# Patient Record
Sex: Male | Born: 1935 | Race: Black or African American | Hispanic: No | Marital: Married | Smoking: Never smoker
Health system: Southern US, Community
[De-identification: ages and names within clinical notes are randomized; demographics above are authoritative.]

## PROBLEM LIST (undated history)

## (undated) DIAGNOSIS — K219 Gastro-esophageal reflux disease without esophagitis: Secondary | ICD-10-CM

## (undated) DIAGNOSIS — S72002A Fracture of unspecified part of neck of left femur, initial encounter for closed fracture: Secondary | ICD-10-CM

## (undated) DIAGNOSIS — N400 Enlarged prostate without lower urinary tract symptoms: Secondary | ICD-10-CM

## (undated) DIAGNOSIS — I509 Heart failure, unspecified: Secondary | ICD-10-CM

## (undated) DIAGNOSIS — I5189 Other ill-defined heart diseases: Secondary | ICD-10-CM

## (undated) DIAGNOSIS — Z9189 Other specified personal risk factors, not elsewhere classified: Secondary | ICD-10-CM

## (undated) DIAGNOSIS — E785 Hyperlipidemia, unspecified: Secondary | ICD-10-CM

## (undated) HISTORY — DX: Hyperlipidemia, unspecified: E78.5

## (undated) HISTORY — PX: COLON SURGERY: SHX602

## (undated) HISTORY — DX: Gastro-esophageal reflux disease without esophagitis: K21.9

## (undated) HISTORY — DX: Heart failure, unspecified: I50.9

## (undated) HISTORY — DX: Other ill-defined heart diseases: I51.89

## (undated) HISTORY — DX: Benign prostatic hyperplasia without lower urinary tract symptoms: N40.0

## (undated) HISTORY — DX: Other specified personal risk factors, not elsewhere classified: Z91.89

---

## 1999-10-14 ENCOUNTER — Encounter (INDEPENDENT_AMBULATORY_CARE_PROVIDER_SITE_OTHER): Payer: Self-pay | Admitting: Specialist

## 1999-10-14 ENCOUNTER — Encounter: Payer: Self-pay | Admitting: Gastroenterology

## 1999-10-14 ENCOUNTER — Inpatient Hospital Stay (HOSPITAL_COMMUNITY): Admission: EM | Admit: 1999-10-14 | Discharge: 1999-10-24 | Payer: Self-pay | Admitting: Gastroenterology

## 1999-10-23 ENCOUNTER — Encounter: Payer: Self-pay | Admitting: General Surgery

## 2001-01-19 ENCOUNTER — Ambulatory Visit (HOSPITAL_COMMUNITY): Admission: RE | Admit: 2001-01-19 | Discharge: 2001-01-19 | Payer: Self-pay | Admitting: Gastroenterology

## 2001-06-09 ENCOUNTER — Ambulatory Visit (HOSPITAL_COMMUNITY): Admission: RE | Admit: 2001-06-09 | Discharge: 2001-06-09 | Payer: Self-pay | Admitting: Family Medicine

## 2001-07-15 ENCOUNTER — Encounter: Admission: RE | Admit: 2001-07-15 | Discharge: 2001-10-08 | Payer: Self-pay | Admitting: Internal Medicine

## 2001-11-15 ENCOUNTER — Encounter (HOSPITAL_BASED_OUTPATIENT_CLINIC_OR_DEPARTMENT_OTHER): Admission: RE | Admit: 2001-11-15 | Discharge: 2001-11-25 | Payer: Self-pay | Admitting: Internal Medicine

## 2002-03-01 ENCOUNTER — Encounter (HOSPITAL_BASED_OUTPATIENT_CLINIC_OR_DEPARTMENT_OTHER): Admission: RE | Admit: 2002-03-01 | Discharge: 2002-05-30 | Payer: Self-pay | Admitting: Internal Medicine

## 2002-03-16 ENCOUNTER — Encounter: Payer: Self-pay | Admitting: Cardiology

## 2002-03-16 ENCOUNTER — Ambulatory Visit (HOSPITAL_COMMUNITY): Admission: RE | Admit: 2002-03-16 | Discharge: 2002-03-16 | Payer: Self-pay | Admitting: Family Medicine

## 2002-06-06 ENCOUNTER — Encounter (HOSPITAL_BASED_OUTPATIENT_CLINIC_OR_DEPARTMENT_OTHER): Admission: RE | Admit: 2002-06-06 | Discharge: 2002-09-03 | Payer: Self-pay | Admitting: Internal Medicine

## 2002-09-12 ENCOUNTER — Encounter (HOSPITAL_BASED_OUTPATIENT_CLINIC_OR_DEPARTMENT_OTHER): Admission: RE | Admit: 2002-09-12 | Discharge: 2002-12-11 | Payer: Self-pay | Admitting: Internal Medicine

## 2004-05-03 ENCOUNTER — Encounter (INDEPENDENT_AMBULATORY_CARE_PROVIDER_SITE_OTHER): Payer: Self-pay | Admitting: *Deleted

## 2004-05-03 ENCOUNTER — Ambulatory Visit (HOSPITAL_COMMUNITY): Admission: RE | Admit: 2004-05-03 | Discharge: 2004-05-03 | Payer: Self-pay | Admitting: Gastroenterology

## 2005-12-25 ENCOUNTER — Encounter (HOSPITAL_BASED_OUTPATIENT_CLINIC_OR_DEPARTMENT_OTHER): Admission: RE | Admit: 2005-12-25 | Discharge: 2006-03-25 | Payer: Self-pay | Admitting: Internal Medicine

## 2007-01-01 ENCOUNTER — Ambulatory Visit: Payer: Self-pay | Admitting: Vascular Surgery

## 2007-01-01 ENCOUNTER — Ambulatory Visit: Admission: RE | Admit: 2007-01-01 | Discharge: 2007-01-01 | Payer: Self-pay | Admitting: Family Medicine

## 2010-11-22 NOTE — Procedures (Signed)
[facility]  Patient:    [name], [name]                       MRN: [id number] Proc. Date: 01/19/01 Adm. Date:  [id number] Attending:  Louie Bun CC:         Elvina Sidle, M.D.  Anselm Pancoast. Zachery Dakins, M.D.   Procedure Report  PROCEDURE:  Colonoscopy.  INDICATION FOR PROCEDURE:  History of Dukes Stage B-2 colon cancer, status post sigmoid resection one year ago.  DESCRIPTION OF PROCEDURE:  The patient was placed in the left lateral decubitus position and placed on the pulse monitor with continuous low-flow oxygen delivered by nasal cannula.  He was sedated with 40 mg IV Demerol and 5 mg IV Versed.  The Olympus video colonoscope was inserted into the rectum and advanced to the cecum, confirmed by transillumination at McBurneys point and visualization of the ileocecal valve and appendiceal orifice.  The prep was excellent.  The cecum, ascending, transverse, and descending colon all appeared normal with no masses, polyps, diverticula, or other mucosal abnormalities.  There were a few scattered diverticula seen in the descending colon down to the anastomosis which was seen at 30 cm and appeared intact with no visible suspicion of neoplasm.  No biopsies were taken.  The rectum distal to the anastomosis appeared normal with no further diverticula or polyps seen. The scope was then withdrawn, and the patient returned to the recovery room in stable condition.  He tolerated the procedure well, and there were no immediate complications.  IMPRESSION: 1. Intact surgical anastomosis at 30 cm, status post left hemicolectomy. 2. Scattered left-sided diverticula.  PLAN:  Repeat colonoscopy in three years. DD:  01/19/01 TD:  01/20/01 Job: [id number]

## 2010-11-22 NOTE — Assessment & Plan Note (Signed)
[facility]   NAME:  NOVAK, STGERMAINE NO.:  [id number]   MEDICAL RECORD NO.:  [id number]      DATE OF BIRTH:  [birth year]   PHYSICIAN:  Maxwell Caul, M.D. VISIT DATE:  02/06/2006                                     OFFICE VISIT   PURPOSE OF TODAY'S VISIT:  Mr. Erhart returns in followup from his venostasis  ulcer.   WOUND EXAM:  On the left lateral foot, the remaining ulceration is now 100%  epithelialized.  It is less than half a centimeter long.  There is a large  amount of hyperkeratotic skin around this for which I am going to give him  triamcinolone.  However, I do not think there is any wound under here.  Most  of the stasis changes appear to be on the lateral aspect of the foot and  ankle.  There is no edema per se; therefore, I do not think he would benefit  from graded pressure wrapping any further or graded pressure stockings.   DIAGNOSIS:  Venostasis wound on the lateral aspect of the left foot.  This  is now declared 100% resolved.  The skin around this has stasis changes;  however, I do not think he needs anything further in terms of graded  pressure stockings.  He knows to call us as needed.      Maxwell Caul, M.D.  Electronically Signed     MGR/MEDQ  D:  02/06/2006  T:  02/06/2006  Job:  [id number]

## 2010-11-22 NOTE — Consult Note (Signed)
[facility]  Patient:    [name], [name] Visit Number: [id number] MRN: [id number]          Service Type: FTC Location: FOOT Attending Physician:  Sharren Bridge Dictated by:   Jonelle Sports Cheryll Cockayne, M.D. Proc. Date: 07/15/01 Admit Date:  07/15/2001   CC:         Elvina Sidle, M.D.   Consultation Report  HISTORY:  This 77 year old black male is referred at the courtesy of Dr. Elvina Sidle for assistance with the management of an ulcer on the medial aspect of the left heel just distal to the medial malleolus on that side.  The patient has no history of diabetes and no history of active thrombophlebitis ever having been recognized; nonetheless, he has some degree of chronic venous insufficiency in both lower extremities, slightly worse on the left than on the right.  He also has some peripheral arterial disease but has never had ischemic events in his lower extremities.  With that background history, the patient developed, approximately two months ago, a superficial ulcer on the medial aspect of the left heel just distal to the medial malleolus.  He denies any trauma to the area, says it simply appeared spontaneously.  It has progressively enlarged since that time.  There has been minimal drainage and no obvious infection and he has treated this simply with antibiotic cream/one round of oral antibiotics.  This apparently did not result in any reduction in the size of the ulcer and accordingly, he was placed in an Unna wrap from the area of the metatarsal heads just above the ankle; again, no effective healing.  He is accordingly referred for our consultation and assistance.  PAST MEDICAL HISTORY:  Past medical history includes colon cancer in addition to those things previously mentioned.  ALLERGIES:  He has no known medicinal allergies.  MEDICATIONS:  His regular medications include an antihypertensive medication and a pain  pill.  EXAMINATION:  EXTREMITIES:  Examination today is limited to the distal lower extremities. The patients feet are characterized by gryphotic nails and clawing of the lesser toes on both feet.  There is 2+ edema of the left lower extremity from the knee downward and trace to 1+ edema of the right lower extremity. Superficial varicosities are quite apparent on both ankle areas on the dorsum of both feet.  Arterial pulses are palpable and appear adequate.  Skin temperatures are essentially normal.  Monofilament testing shows the preservation of protective sensation throughout both feet.  On the plantar aspects of the feet, there are exophytic calluses on the plantar aspects of the proximal phalangeal areas of both halluces.  In addition, there are significant calluses underlying the fifth metatarsal heads bilaterally.  There is a callus on the plantar aspect of the right heel. There is an exophytic callus at the tip of the right fourth toe.  On the dorsal aspect of the feet, there is a callus overlying the proximal interphalangeal joint of the second toe, which is a clawtoe as well.  On the medial aspect of the left foot in the area just distal to the medial malleolus is a superficial open ulceration measuring 34 x 40 mm with very slight exudate on its otherwise granular surface.  The skin margins around this are slightly elevated and crusty.  There is no evidence of surrounding infection.  There is no evidence of active phlebitis in that extremity.  DISPOSITION: 1. Efforts are made to give the patient and his wife instruction about  foot    care but this is thought to have been of limited avail. 2. The nails are reduced manually by the nurses with satisfactory result. 3. The calluses on the plantar aspects of both halluces and on the plantar    aspects of both fifth metatarsal head areas are sharply pared without    incident. 4. The callus on the plantar aspect of the right heel  is pared without    incident and because there is a slight core, 15% salicylic acid and    collodion are applied following the paring. 5. The exophytic callus on the tip of the left fourth toe is sharply pared    without incident. 6. The callus on the dorsal aspect of the PIP joint of the right second toe is    sharply pared without incident. 7. The shaggy separated skin at the margins of the open ulcer on the instep of    the left foot is sharply pared away without incident. 8. The ulcer is then dressed with Silverlon and the patient is placed in a    Profore wrap running all the way to the knee. 9. The patient will be seen in one and two weeks for change of the dressing    and wrap by the nurses and in three weeks by the physician. Dictated by:   Jonelle Sports Cheryll Cockayne, M.D. Attending Physician:  Sharren Bridge DD:  07/15/01 TD:  07/15/01 Job: [id number]

## 2010-11-22 NOTE — Assessment & Plan Note (Signed)
[facility]   NAME:  [name], [name] NO.:  [id number]   MEDICAL RECORD NO.:  [id number]      DATE OF BIRTH:  02/21/1926   PHYSICIAN:  Maxwell Caul, M.D. VISIT DATE:  01/30/2006                                     OFFICE VISIT   PURPOSE OF TODAY'S VISIT:  Continued followup of a venous stasis wound.   WOUND EXAMINATION:  [name] has had an ulcer on his left lateral malleolus  that has been treated with compression therapy.  He has signs of venous  stasis, but without any odor or significant pain.  His peripheral pulses  seem to be intact.   He has a now small venous stasis ulcer over the left lateral malleolus.  This is in, I think, a fairly good state of healing.  It has probably 50%  epithelialized now.  He has a large amount of hyperkeratotic skin around  this which I think should respond to triamcinolone.  This appears to be  improved.  Incidentally, also reduced a callous over the metatarsal head on  the plantar surface of his foot.   Wound since last visit is improved.  I have put triamcinolone to this area.  We did a partial thickness debridement to take away some of the  hyperkeratotic areas around the wound.  Other than that, Una boot  compression to continue.  He will return in seven to 10 days.  He will  probably need to have graded pressure stockings ultimately, however, I did  not order these today.      Maxwell Caul, M.D.  Electronically Signed     MGR/MEDQ  D:  01/30/2006  T:  01/30/2006  Job:  [id number]

## 2010-11-22 NOTE — Consult Note (Signed)
[name], [name]                ACCOUNT NO.:  [id number]   MEDICAL RECORD NO.:  [id number]          PATIENT TYPE:  REC   LOCATION:  FOOT                         FACILITY:  [facility]   PHYSICIAN:  Jonelle Sports. Sevier, M.D. DATE OF BIRTH:  04-Jun-1926   DATE OF CONSULTATION:  12/25/2005  DATE OF DISCHARGE:                                   CONSULTATION   HISTORY OF PRESENT ILLNESS:  This 77 year old black male is seen at the  courtesy of Dr. Christell Constant and his physician's assistant.  The patient had been a  patient in our foot care center which was the antecedent to the W[facility]  Center in the past.  He had at that time some lateral hindfoot ulcers which  had been thought to be only venous stasis and which have responded  satisfactorily to usual treatment measures to include compressive wraps.   He appears now with a new lesion in that same area which he first noted  approximately 4-6 weeks ago.  He has had some minimal topical attention to  the wound and has had a course of antibiotics which he continues (the name  of which is unknown, but by description it sounds to be cephalexin).  He is  referred now for our further evaluation and advice.   PAST MEDICAL HISTORY:  Notable for hyperlipidemia and history of colon  cancer.  He is not presently on any chemotherapy or other immuno-modifying  agents.   MEDICATIONS:  Regular medications other than Lipitor and his current  antibiotic are unknown.  The patient did not bring his medications with him.   ALLERGIES:  He is said to have no medicinal allergies.   PHYSICAL EXAMINATION:  Examination today is limited to the distal lower  extremities.  Both lower extremities show trace edema, slightly more so on  the left than on the right.  There is a hammer toe deformity on the second  to of the right foot with a heavy callus on the dorsal aspect of the joint  there.  There is also a heavy callus on the lateral aspect of the proximal  phalanx of the  hallux.  That extremity is otherwise unremarkable and will  not be further described.   The left lower extremity as above, has a mild degree of edema.  Pulses are  palpable and bounding.  Monofilament testing shows that he has protective  sensation.  Again on this foot there are calluses underlying the plantar  aspect of the proximal phalanx of the hallux and another on the plantar  aspect of the heel.  On the lateral aspect of the heel in an area of scarred  skin is noted a rather shaggy and crusty active ulceration which measures an  estimated 0.8 x 0.8 x 0.3 cm.  There is no active drainage at the moment, no  odor, and no evidence of deep or surrounding infection.   IMPRESSION:  Venous ulceration lateral left hindfoot, recurrent.   DISPOSITION:  The primary wound on the left foot is sharply debrided full  thickness with removal of a great deal of  slough and crusty tissue and  indeed the resulting wound is much larger at 1.6 x 1.3 x 0.1 cm.  The wound  base at this point is reasonably healthy, but not fully granulated.   There is some fibrinous adherent slough in the wound base despite the  debridement.   The calluses on the dorsal aspect of the right second toe, on the plantar  aspect of both halluces, and on the plantar aspect of the left heel are all  sharply reduced by paring and 15% salicylic acid and collodion is applied to  all of those on the plantar surface of the feet.   He is given a silo pad to protect the dorsal aspect of the joints on his  right second hammertoe.   Following debridement of the wound on the left, it is treated with an  application of Panafil covered by an Allevyn pad and that extremity is then  placed in a Profore wrap from the base of the toes to the knees to combat  the venous hypertension and edema.   Follow-up visit will be here in 10 days on January 05, 2006.           ______________________________  Jonelle Sports. Cheryll Cockayne, M.D.     RES/MEDQ  D:   12/25/2005  T:  12/25/2005  Job:  [id number]   cc:   Ernestina Penna, M.D.  Fax: [phone]

## 2010-11-22 NOTE — Assessment & Plan Note (Signed)
[facility]   NAME:  [name], [name] NO.:  [id number]   MEDICAL RECORD NO.:  [id number]           DATE OF BIRTH:   PHYSICIAN:  Maxwell Caul, M.D.      VISIT DATE:                                     OFFICE VISIT   VITAL SIGNS:  The patient's vitals were stable.  He is afebrile.   PURPOSE OF TODAY'S VISIT:  This is a followup visit.  Mr. [name] has  been followed in the [facility] Care Center for a left lower extremity venous  stasis ulcer.  He was last seen by the physicians on 06/20 but since then he  has been having [employer] wrap placed weekly by the nurses.   WOUND EXAM:  On the left lateral ankle just near the lateral malleolus he  has a clean now epithelializing wound.  The wound dimensions are 0.7 x 1 cm.  The depth is minimal.  The wound appears to be epithelializing.  He still  has 1+ edema.  He is noted to have a 2-3+ bounding dorsalis pedis pulse.   IMPRESSION:  Improving stasis ulcer now with epithelialization.  No evidence  of infection.  No evidence of arterial insufficiency.   The patient is to resume compression with an [employer].  It is anticipated  this will heal.      Maxwell Caul, M.D.  Electronically Signed     MGR/MEDQ  D:  01/23/2006  T:  01/23/2006  Job:  [id number]

## 2010-11-22 NOTE — Assessment & Plan Note (Signed)
[facility]   NAME:  CHRISTPHOR, GROFT                ACCOUNT NO.:  [id number]   MEDICAL RECORD NO.:  [id number]      DATE OF BIRTH:  [birth year]   PHYSICIAN:  Jake Shark A. Tanda Rockers, M.D. VISIT DATE:  01/16/2006                                     OFFICE VISIT   SUBJECTIVE:  Mr. Mackowski is a 77 year old male who has a right lateral  malleolus ulcer that has been treated with compression therapy.  During the  interim, he has worn the multilevel wrap without difficulty.  He reports  moderate drainage, no malodor and no pain.   OBJECTIVE:  VITAL SIGNS:  Blood pressure 104/60, pulse not indicated,  afebrile, respirations 16.  EXTREMITIES:  Inspection of the left lower extremity shows that the ulcer  has a necrotic periphery and base.  These areas were full thickness debrided  with hemorrhage control with cautery.  The wound was photographed, measured  and entered into the wound expert program.   ASSESSMENT:  Improved plan.  We will use triamcinolone ointment and a Dome  paste [employer]  We will reevaluate the patient in one week.           ______________________________  Theresia Majors. Tanda Rockers, M.D.     Cephus Slater  D:  01/16/2006  T:  01/16/2006  Job:  [id number]

## 2010-11-22 NOTE — Op Note (Signed)
NAMEGRADIE, [name]                ACCOUNT NO.:  [id number]   MEDICAL RECORD NO.:  [id number]          PATIENT TYPE:  AMB   LOCATION:  ENDO                         [facility]ACILITY:  [facility]   PHYSICIAN:  John C. Madilyn Fireman, M.D.    DATE OF BIRTH:  28-Mar-1926   DATE OF PROCEDURE:  05/03/2004  DATE OF DISCHARGE:                                 OPERATIVE REPORT   PROCEDURE:  Colonoscopy.   INDICATIONS FOR PROCEDURE:  History of colon cancer, status post resection  in 1992.   PROCEDURE:  The patient was placed in the left lateral decubitus position  and placed on the pulse monitor with continuous low flow oxygen delivered by  nasal cannula.  He was sedated with 25 mcg IV fentanyl and 3 mg IV Versed.  The Olympus video colonoscope was inserted into the rectum and advanced to  the cecum, confirmed by transillumination at McBurney's point and  visualization of the ileocecal valve and appendiceal orifice.  The prep was  excellent.  The cecum and ascending colon appeared normal, with no masses,  polyps, diverticula, or other mucosal abnormalities.  Within the transverse  colon, there was an 8 mm sessile polyp that was removed by snare.  The  remainder of the transverse colon appeared normal.  In the descending and  sigmoid colon, there were several scattered diverticula.  The previous  surgical anastomosis was barely visible at 30 cm, with no visible suspicion  of neoplasm.  The rectum appeared normal on retroflexed view.  The anus  revealed no obvious internal hemorrhoids.  The scope was then withdrawn and  the patient returned to the recovery room in stable condition.  He tolerated  the procedure well, and there were no immediate complications.   IMPRESSION:  1.  Transverse colon polyp.  2.  Sigmoid diverticulosis.   PLAN:  Await histology, and will repeat colonoscopy in three to five years.      JCH/MEDQ  D:  05/03/2004  T:  05/03/2004  Job:  [id number]   cc:   _________   Anselm Pancoast. Zachery Dakins,  M.D.  [address], [address]  [place]  [place] [address]  Fax: [phone]

## 2010-11-22 NOTE — Assessment & Plan Note (Signed)
[facility]   NAME:  [name], [name]                ACCOUNT NO.:  [id number]   MEDICAL RECORD NO.:  [id number]      DATE OF BIRTH:  Dec 04, 1925   PHYSICIAN:  Jake Shark A. Tanda Rockers, M.D. VISIT DATE:  12/06/2005                                     OFFICE VISIT   VITAL SIGNS:  His blood pressure is 110/60, respirations are 16, pulse 68,  and he is afebrile.   SUBJECTIVE:  [name] is a 77 year old man who returns for a followup of a  stasis ulcer in the left lateral malleolus.  During the interim he denies  fever.  He has had no pain.   OBJECTIVE:  Inspection of the lower extremity shows that there is a  persistence of 3+ edema.  There is 3+ bounding dorsalis pedis pulse.  The  ulcer itself has a clean, granulating base with minimum drainage.  The  measurements have been entered into the wound expert per the wound care  nurse.   ASSESSMENT:  Improving stasis ulcer.   MANAGEMENT PLAN & GOAL:  We will resume the compression therapy with a  dome's pace Unna boot.  We will reevaluate the patient in 10 days.           ______________________________  Theresia Majors Tanda Rockers, M.D.     Cephus Slater  D:  01/05/2006  T:  01/05/2006  Job:  [id number]

## 2011-04-07 ENCOUNTER — Other Ambulatory Visit: Payer: Self-pay | Admitting: Family Medicine

## 2011-04-07 DIAGNOSIS — J3489 Other specified disorders of nose and nasal sinuses: Secondary | ICD-10-CM

## 2011-04-08 ENCOUNTER — Ambulatory Visit
Admission: RE | Admit: 2011-04-08 | Discharge: 2011-04-08 | Disposition: A | Discharge status: Home / Self Care | Payer: Medicare Other | Source: Ambulatory Visit | Attending: Family Medicine | Admitting: Family Medicine

## 2011-04-08 DIAGNOSIS — J3489 Other specified disorders of nose and nasal sinuses: Secondary | ICD-10-CM

## 2012-10-27 ENCOUNTER — Encounter: Payer: Self-pay | Admitting: Family Medicine

## 2012-10-27 ENCOUNTER — Ambulatory Visit (INDEPENDENT_AMBULATORY_CARE_PROVIDER_SITE_OTHER): Payer: Medicare Other | Admitting: Family Medicine

## 2012-10-27 VITALS — BP 90/64 | HR 60 | Temp 97.5°F | Resp 14 | Wt 147.0 lb

## 2012-10-27 DIAGNOSIS — E785 Hyperlipidemia, unspecified: Secondary | ICD-10-CM | POA: Insufficient documentation

## 2012-10-27 DIAGNOSIS — K5900 Constipation, unspecified: Secondary | ICD-10-CM

## 2012-10-27 DIAGNOSIS — N400 Enlarged prostate without lower urinary tract symptoms: Secondary | ICD-10-CM

## 2012-10-27 DIAGNOSIS — I951 Orthostatic hypotension: Secondary | ICD-10-CM

## 2012-10-27 DIAGNOSIS — K219 Gastro-esophageal reflux disease without esophagitis: Secondary | ICD-10-CM | POA: Insufficient documentation

## 2012-10-27 NOTE — Progress Notes (Signed)
  Subjective:    Patient ID: [name], male    DOB: [birth year], 79 y.o.   MRN: [id number]  HPI  Pleasant 79 year old gentleman who rarely comes to the doctor.  He presents today with several concerns. First he feels very dizzy especially upon standing. Of note he is taking Flomax 0.4 mg by mouth daily for BPH. He also is on Lasix 40 mg by mouth when necessary leg swelling. He denies ear pain, antalgic, or tinnitus.  His blood pressure today in the office is very low at 90/64.  He has a reports episodic constipation. He states he normally goes once a day. However now is going 3 days without having a bowel movement. He denies any abdominal pain. He denies any melena or hematochezia.  He's also having occasional urinary incontinence. Of note he is taking Lasix. Past Medical History  Diagnosis Date  . BPH (benign prostatic hyperplasia)   . GERD (gastroesophageal reflux disease)   . Hyperlipidemia    Current outpatient prescriptions:cetirizine (ZYRTEC) 10 MG tablet, Take 10 mg by mouth daily., Disp: , Rfl: ;  furosemide (LASIX) 40 MG tablet, Take 40 mg by mouth daily., Disp: , Rfl: ;  omeprazole (PRILOSEC) 20 MG capsule, Take 20 mg by mouth daily., Disp: , Rfl: ;  simvastatin (ZOCOR) 40 MG tablet, Take 40 mg by mouth every evening., Disp: , Rfl: ;  tamsulosin (FLOMAX) 0.4 MG CAPS, Take 0.4 mg by mouth daily., Disp: , Rfl:  History   Social History  . Marital Status: Married    Spouse Name: N/A    Number of Children: N/A  . Years of Education: N/A   Occupational History  . Not on file.   Social History Main Topics  . Smoking status: Never Smoker   . Smokeless tobacco: Not on file  . Alcohol Use: Not on file  . Drug Use: Not on file  . Sexually Active: Not on file   Other Topics Concern  . Not on file   Social History Narrative  . No narrative on file      Review of Systems  All other systems reviewed and are negative.       Objective:   Physical Exam   Constitutional: He appears well-developed and well-nourished.  HENT:  Right Ear: Tympanic membrane and ear canal normal.  Left Ear: Tympanic membrane and ear canal normal.  Neck: Carotid bruit is not present. No mass and no thyromegaly present.  Cardiovascular: Normal rate, regular rhythm and normal heart sounds.  Exam reveals no gallop and no friction rub.   No murmur heard. Pulmonary/Chest: Effort normal and breath sounds normal. No respiratory distress. He has no wheezes. He has no rales.  Abdominal: Soft. Bowel sounds are normal.          Assessment & Plan:  Orthostatic hypotension  Unspecified constipation  BPH (benign prostatic hyperplasia)  I asked the patient to hold his Flomax as well as his Lasix. I am hoping this will help raise his blood pressure and ease some of the orthostatic hypotension which I feel  is causing his dizziness. We'll need to monitor for urinary retention off the Flomax. If those symptoms develop we could consider starting finasteride. For his incontinence, I'm going to try to hold Lasix and see if this will improve. For the constipation, asked the patient to begin once daily MiraLax. He is to follow up in one week if no better.

## 2012-10-27 NOTE — Patient Instructions (Addendum)
Stop flomax and lasix (furosemide) due to dizziness.  Flomax can cause dizziness.    Lasix makes you pee and it drops blood presssure.   Start miralax everyday until your bowel movements are regular.

## 2012-11-18 ENCOUNTER — Encounter: Payer: Self-pay | Admitting: Family Medicine

## 2012-11-18 ENCOUNTER — Ambulatory Visit (INDEPENDENT_AMBULATORY_CARE_PROVIDER_SITE_OTHER): Payer: Medicare Other | Admitting: Family Medicine

## 2012-11-18 VITALS — BP 120/60 | HR 60 | Temp 97.7°F | Resp 16 | Wt 147.0 lb

## 2012-11-18 DIAGNOSIS — R3129 Other microscopic hematuria: Secondary | ICD-10-CM

## 2012-11-18 DIAGNOSIS — R319 Hematuria, unspecified: Secondary | ICD-10-CM

## 2012-11-18 DIAGNOSIS — N400 Enlarged prostate without lower urinary tract symptoms: Secondary | ICD-10-CM

## 2012-11-18 LAB — URINALYSIS, ROUTINE W REFLEX MICROSCOPIC
Bilirubin Urine: NEGATIVE
Glucose, UA: NEGATIVE mg/dL
Ketones, ur: NEGATIVE mg/dL
Protein, ur: NEGATIVE mg/dL

## 2012-11-18 LAB — URINALYSIS, MICROSCOPIC ONLY: Casts: NONE SEEN

## 2012-11-18 NOTE — Progress Notes (Signed)
  Subjective:    Patient ID: [name], male    DOB: 1925-07-12, 79 y.o.   MRN: [id number]  HPI Patient reports gross hematuria x1 week. It began last week with vague pain in his lower back, and some mild dysuria. Since that time the dysuria and back pain have totally resolved. The hematuria is lessening. He denies any fevers chills nausea or vomiting. He denies any hesitancy, frequency, or dysuria at the present time. He denies any back or pelvic pain. His urinalysis is significant today for hematuria. Past Medical History  Diagnosis Date  . BPH (benign prostatic hyperplasia)   . GERD (gastroesophageal reflux disease)   . Hyperlipidemia    Current Outpatient Prescriptions on File Prior to Visit  Medication Sig Dispense Refill  . tamsulosin (FLOMAX) 0.4 MG CAPS Take 0.4 mg by mouth daily.       No current facility-administered medications on file prior to visit.   No Known Allergies .   Review of Systems  All other systems reviewed and are negative.       Objective:   Physical Exam  Cardiovascular: Normal rate, regular rhythm and normal heart sounds.   Pulmonary/Chest: Effort normal and breath sounds normal. No respiratory distress. He has no wheezes. He has no rales. He exhibits no tenderness.  Abdominal: Soft. Bowel sounds are normal. He exhibits no distension. There is no tenderness. There is no rebound.  Genitourinary: Rectum normal and penis normal. Prostate is enlarged. Prostate is not tender. Right testis shows no mass and no tenderness. Left testis shows no mass and no tenderness. Uncircumcised. No phimosis, paraphimosis, hypospadias or penile erythema. No discharge found.  Lymphadenopathy:       Right: No inguinal adenopathy present.       Left: No inguinal adenopathy present.          Assessment & Plan:  1. Hematuria CT scan to evaluate for nephrolithiasis or renal cell carcinoma. Urology consult for cystoscopy. I will send a urine culture to rule out  subclinical infection although the patient is asymptomatic today. - Urinalysis, Routine w reflex microscopic - CT Abdomen Pelvis W Contrast; Future - Ambulatory referral to Urology  2. Microscopic hematuria - Urine culture  3. BPH (benign prostatic hyperplasia) - CT Abdomen Pelvis W Contrast; Future - Ambulatory referral to Urology

## 2012-11-19 LAB — URINE CULTURE: Colony Count: NO GROWTH

## 2012-11-24 ENCOUNTER — Other Ambulatory Visit: Payer: Medicare Other

## 2012-11-25 ENCOUNTER — Telehealth: Payer: Self-pay | Admitting: Family Medicine

## 2012-11-25 ENCOUNTER — Other Ambulatory Visit: Payer: Self-pay | Admitting: Family Medicine

## 2012-11-25 DIAGNOSIS — R319 Hematuria, unspecified: Secondary | ICD-10-CM

## 2012-11-26 ENCOUNTER — Ambulatory Visit: Payer: Medicare Other

## 2012-11-26 ENCOUNTER — Other Ambulatory Visit: Payer: Self-pay | Admitting: Family Medicine

## 2012-11-26 ENCOUNTER — Ambulatory Visit
Admission: RE | Admit: 2012-11-26 | Discharge: 2012-11-26 | Disposition: A | Discharge status: Home / Self Care | Payer: Medicare Other | Source: Ambulatory Visit | Attending: Family Medicine | Admitting: Family Medicine

## 2012-11-26 ENCOUNTER — Inpatient Hospital Stay
Admission: RE | Admit: 2012-11-26 | Discharge: 2012-11-26 | Disposition: A | Discharge status: Home / Self Care | Payer: Medicare Other | Source: Ambulatory Visit | Attending: Family Medicine | Admitting: Family Medicine

## 2012-11-26 ENCOUNTER — Other Ambulatory Visit: Payer: Medicare Other

## 2012-11-26 DIAGNOSIS — R319 Hematuria, unspecified: Secondary | ICD-10-CM

## 2012-11-26 MED ORDER — IOHEXOL 300 MG/ML  SOLN: 100.0000 mL | Freq: Once | INTRAMUSCULAR | Status: AC | PRN

## 2012-11-26 NOTE — Progress Notes (Signed)
..  Patient's dtr aware  

## 2012-11-27 NOTE — Telephone Encounter (Signed)
Taken care of already

## 2013-02-17 ENCOUNTER — Emergency Department (HOSPITAL_COMMUNITY): Payer: Medicare Other

## 2013-02-17 ENCOUNTER — Encounter (HOSPITAL_COMMUNITY)
Admission: EM | Disposition: A | Discharge status: Skilled Nursing Facility | Payer: Self-pay | Source: Home / Self Care | Attending: Internal Medicine

## 2013-02-17 ENCOUNTER — Inpatient Hospital Stay (HOSPITAL_COMMUNITY)
Admission: EM | Admit: 2013-02-17 | Discharge: 2013-02-21 | DRG: 482 | Disposition: A | Discharge status: Skilled Nursing Facility | Payer: Medicare Other | Attending: Internal Medicine | Admitting: Internal Medicine

## 2013-02-17 ENCOUNTER — Other Ambulatory Visit: Payer: Self-pay

## 2013-02-17 ENCOUNTER — Inpatient Hospital Stay (HOSPITAL_COMMUNITY): Payer: Medicare Other | Admitting: Certified Registered Nurse Anesthetist

## 2013-02-17 ENCOUNTER — Inpatient Hospital Stay (HOSPITAL_COMMUNITY): Payer: Medicare Other

## 2013-02-17 ENCOUNTER — Encounter (HOSPITAL_COMMUNITY): Payer: Self-pay | Admitting: Emergency Medicine

## 2013-02-17 ENCOUNTER — Encounter (HOSPITAL_COMMUNITY): Payer: Self-pay | Admitting: Certified Registered Nurse Anesthetist

## 2013-02-17 DIAGNOSIS — N400 Enlarged prostate without lower urinary tract symptoms: Secondary | ICD-10-CM | POA: Diagnosis present

## 2013-02-17 DIAGNOSIS — S72033A Displaced midcervical fracture of unspecified femur, initial encounter for closed fracture: Principal | ICD-10-CM | POA: Diagnosis present

## 2013-02-17 DIAGNOSIS — W06XXXA Fall from bed, initial encounter: Secondary | ICD-10-CM | POA: Diagnosis present

## 2013-02-17 DIAGNOSIS — S72002S Fracture of unspecified part of neck of left femur, sequela: Secondary | ICD-10-CM

## 2013-02-17 DIAGNOSIS — S72002A Fracture of unspecified part of neck of left femur, initial encounter for closed fracture: Secondary | ICD-10-CM

## 2013-02-17 DIAGNOSIS — E785 Hyperlipidemia, unspecified: Secondary | ICD-10-CM | POA: Diagnosis present

## 2013-02-17 DIAGNOSIS — K219 Gastro-esophageal reflux disease without esophagitis: Secondary | ICD-10-CM | POA: Diagnosis present

## 2013-02-17 DIAGNOSIS — S72009A Fracture of unspecified part of neck of unspecified femur, initial encounter for closed fracture: Secondary | ICD-10-CM

## 2013-02-17 DIAGNOSIS — D696 Thrombocytopenia, unspecified: Secondary | ICD-10-CM

## 2013-02-17 DIAGNOSIS — Z23 Encounter for immunization: Secondary | ICD-10-CM

## 2013-02-17 DIAGNOSIS — M810 Age-related osteoporosis without current pathological fracture: Secondary | ICD-10-CM | POA: Diagnosis present

## 2013-02-17 DIAGNOSIS — Z79899 Other long term (current) drug therapy: Secondary | ICD-10-CM

## 2013-02-17 DIAGNOSIS — Y92009 Unspecified place in unspecified non-institutional (private) residence as the place of occurrence of the external cause: Secondary | ICD-10-CM

## 2013-02-17 DIAGNOSIS — Z7901 Long term (current) use of anticoagulants: Secondary | ICD-10-CM

## 2013-02-17 HISTORY — DX: Fracture of unspecified part of neck of left femur, initial encounter for closed fracture: S72.002A

## 2013-02-17 HISTORY — PX: PERCUTANEOUS PINNING: SHX2209

## 2013-02-17 LAB — BASIC METABOLIC PANEL
CO2: 26 mEq/L (ref 19–32)
Chloride: 102 mEq/L (ref 96–112)
Potassium: 3.7 mEq/L (ref 3.5–5.1)
Sodium: 135 mEq/L (ref 135–145)

## 2013-02-17 LAB — CBC
HCT: 37.3 % — ABNORMAL LOW (ref 39.0–52.0)
MCV: 93.7 fL (ref 78.0–100.0)
Platelets: 105 10*3/uL — ABNORMAL LOW (ref 150–400)
RBC: 3.98 MIL/uL — ABNORMAL LOW (ref 4.22–5.81)
WBC: 8.3 10*3/uL (ref 4.0–10.5)

## 2013-02-17 LAB — CREATININE, SERUM: GFR calc Af Amer: 90 mL/min — ABNORMAL LOW (ref >90)

## 2013-02-17 LAB — CBC WITH DIFFERENTIAL/PLATELET
Eosinophils Relative: 0 % (ref 0–5)
Lymphocytes Relative: 1 % — ABNORMAL LOW (ref 12–46)
Lymphs Abs: 0.1 10*3/uL — ABNORMAL LOW (ref 0.7–4.0)
MCV: 94.5 fL (ref 78.0–100.0)
Neutro Abs: 9.6 10*3/uL — ABNORMAL HIGH (ref 1.7–7.7)
Neutrophils Relative %: 96 % — ABNORMAL HIGH (ref 43–77)
Platelets: 113 10*3/uL — ABNORMAL LOW (ref 150–400)
RBC: 4.2 MIL/uL — ABNORMAL LOW (ref 4.22–5.81)
WBC: 10 10*3/uL (ref 4.0–10.5)

## 2013-02-17 LAB — TROPONIN I: Troponin I: <0.3 ng/mL (ref <0.30)

## 2013-02-17 SURGERY — PINNING, EXTREMITY, PERCUTANEOUS
Anesthesia: General | Site: Hip | Laterality: Left | Wound class: Clean

## 2013-02-17 MED ORDER — MENTHOL 3 MG MT LOZG: 1.0000 | LOZENGE | OROMUCOSAL | Status: DC | PRN

## 2013-02-17 MED ORDER — ACETAMINOPHEN 325 MG PO TABS: 650.0000 mg | ORAL_TABLET | Freq: Four times a day (QID) | ORAL | Status: DC | PRN

## 2013-02-17 MED ORDER — HYDROCODONE-ACETAMINOPHEN 5-325 MG PO TABS: 1.0000 | ORAL_TABLET | Freq: Four times a day (QID) | ORAL | Status: DC | PRN

## 2013-02-17 MED ORDER — MORPHINE SULFATE 2 MG/ML IJ SOLN: 0.5000 mg | INTRAMUSCULAR | Status: DC | PRN

## 2013-02-17 MED ORDER — ONDANSETRON HCL 4 MG PO TABS: 4.0000 mg | ORAL_TABLET | Freq: Four times a day (QID) | ORAL | Status: DC | PRN

## 2013-02-17 MED ORDER — DOCUSATE SODIUM 100 MG PO CAPS
100.0000 mg | ORAL_CAPSULE | Freq: Two times a day (BID) | ORAL | Status: DC
  Administered 2013-02-17 – 2013-02-21 (×8): 100 mg via ORAL
  Filled 2013-02-17 (×8): qty 1

## 2013-02-17 MED ORDER — MORPHINE SULFATE 4 MG/ML IJ SOLN
4.0000 mg | Freq: Once | INTRAMUSCULAR | Status: AC
  Administered 2013-02-17: 4 mg via INTRAVENOUS
  Filled 2013-02-17: qty 1

## 2013-02-17 MED ORDER — SENNA-DOCUSATE SODIUM 8.6-50 MG PO TABS: 2.0000 | ORAL_TABLET | Freq: Every day | ORAL | Status: DC

## 2013-02-17 MED ORDER — MORPHINE SULFATE 4 MG/ML IJ SOLN: 4.0000 mg | Freq: Once | INTRAMUSCULAR | Status: DC

## 2013-02-17 MED ORDER — POTASSIUM CHLORIDE IN NACL 20-0.45 MEQ/L-% IV SOLN
INTRAVENOUS | Status: DC
  Administered 2013-02-17: 19:00:00 via INTRAVENOUS
  Filled 2013-02-17 (×5): qty 1000

## 2013-02-17 MED ORDER — BISACODYL 10 MG RE SUPP: 10.0000 mg | Freq: Every day | RECTAL | Status: DC | PRN

## 2013-02-17 MED ORDER — CEFAZOLIN SODIUM-DEXTROSE 2-3 GM-% IV SOLR
2.0000 g | INTRAVENOUS | Status: DC
  Filled 2013-02-17: qty 50

## 2013-02-17 MED ORDER — 0.9 % SODIUM CHLORIDE (POUR BTL) OPTIME
TOPICAL | Status: DC | PRN
  Administered 2013-02-17: 1000 mL

## 2013-02-17 MED ORDER — LIDOCAINE HCL (CARDIAC) 20 MG/ML IV SOLN
INTRAVENOUS | Status: DC | PRN
  Administered 2013-02-17: 100 mg via INTRAVENOUS

## 2013-02-17 MED ORDER — LACTATED RINGERS IV SOLN
INTRAVENOUS | Status: DC | PRN
  Administered 2013-02-17 (×2): via INTRAVENOUS

## 2013-02-17 MED ORDER — ONDANSETRON HCL 4 MG/2ML IJ SOLN: 4.0000 mg | Freq: Four times a day (QID) | INTRAMUSCULAR | Status: DC | PRN

## 2013-02-17 MED ORDER — ONDANSETRON HCL 4 MG/2ML IJ SOLN: 4.0000 mg | Freq: Once | INTRAMUSCULAR | Status: DC | PRN

## 2013-02-17 MED ORDER — HYDROMORPHONE HCL PF 1 MG/ML IJ SOLN
0.2500 mg | INTRAMUSCULAR | Status: DC | PRN
  Administered 2013-02-17: 0.25 mg via INTRAVENOUS

## 2013-02-17 MED ORDER — ENOXAPARIN SODIUM 40 MG/0.4ML ~~LOC~~ SOLN: 40.0000 mg | SUBCUTANEOUS | Status: DC

## 2013-02-17 MED ORDER — TAMSULOSIN HCL 0.4 MG PO CAPS
0.4000 mg | ORAL_CAPSULE | Freq: Every day | ORAL | Status: DC
  Administered 2013-02-17 – 2013-02-21 (×5): 0.4 mg via ORAL
  Filled 2013-02-17 (×5): qty 1

## 2013-02-17 MED ORDER — ACETAMINOPHEN 650 MG RE SUPP: 650.0000 mg | Freq: Four times a day (QID) | RECTAL | Status: DC | PRN

## 2013-02-17 MED ORDER — FENTANYL CITRATE 0.05 MG/ML IJ SOLN
INTRAMUSCULAR | Status: DC | PRN
  Administered 2013-02-17: 50 ug via INTRAVENOUS
  Administered 2013-02-17: 100 ug via INTRAVENOUS

## 2013-02-17 MED ORDER — PHENOL 1.4 % MT LIQD: 1.0000 | OROMUCOSAL | Status: DC | PRN

## 2013-02-17 MED ORDER — POLYETHYLENE GLYCOL 3350 17 G PO PACK: 17.0000 g | PACK | Freq: Every day | ORAL | Status: DC | PRN

## 2013-02-17 MED ORDER — FLEET ENEMA 7-19 GM/118ML RE ENEM: 1.0000 | ENEMA | Freq: Once | RECTAL | Status: AC | PRN

## 2013-02-17 MED ORDER — ONDANSETRON HCL 4 MG/2ML IJ SOLN
INTRAMUSCULAR | Status: DC | PRN
  Administered 2013-02-17: 4 mg via INTRAVENOUS

## 2013-02-17 MED ORDER — ALUM & MAG HYDROXIDE-SIMETH 200-200-20 MG/5ML PO SUSP: 30.0000 mL | ORAL | Status: DC | PRN

## 2013-02-17 MED ORDER — SENNA 8.6 MG PO TABS
1.0000 | ORAL_TABLET | Freq: Two times a day (BID) | ORAL | Status: DC
  Administered 2013-02-17 – 2013-02-21 (×8): 8.6 mg via ORAL
  Filled 2013-02-17 (×9): qty 1

## 2013-02-17 MED ORDER — FINASTERIDE 5 MG PO TABS
5.0000 mg | ORAL_TABLET | Freq: Every day | ORAL | Status: DC
  Administered 2013-02-17 – 2013-02-21 (×5): 5 mg via ORAL
  Filled 2013-02-17 (×5): qty 1

## 2013-02-17 MED ORDER — METOCLOPRAMIDE HCL 10 MG PO TABS: 5.0000 mg | ORAL_TABLET | Freq: Three times a day (TID) | ORAL | Status: DC | PRN

## 2013-02-17 MED ORDER — HYDROCODONE-ACETAMINOPHEN 5-325 MG PO TABS
1.0000 | ORAL_TABLET | Freq: Four times a day (QID) | ORAL | Status: DC | PRN
  Administered 2013-02-17: 1 via ORAL
  Administered 2013-02-18 (×2): 2 via ORAL
  Administered 2013-02-18: 1 via ORAL
  Administered 2013-02-19 – 2013-02-21 (×8): 2 via ORAL
  Filled 2013-02-17 (×2): qty 2
  Filled 2013-02-17: qty 1
  Filled 2013-02-17 (×2): qty 2
  Filled 2013-02-17: qty 1
  Filled 2013-02-17 (×6): qty 2

## 2013-02-17 MED ORDER — CEFAZOLIN SODIUM-DEXTROSE 2-3 GM-% IV SOLR
2.0000 g | Freq: Four times a day (QID) | INTRAVENOUS | Status: AC
  Administered 2013-02-17 – 2013-02-18 (×2): 2 g via INTRAVENOUS
  Filled 2013-02-17 (×2): qty 50

## 2013-02-17 MED ORDER — METOCLOPRAMIDE HCL 5 MG/ML IJ SOLN: 5.0000 mg | Freq: Three times a day (TID) | INTRAMUSCULAR | Status: DC | PRN

## 2013-02-17 MED ORDER — HYDROMORPHONE HCL PF 1 MG/ML IJ SOLN
INTRAMUSCULAR | Status: AC
  Filled 2013-02-17: qty 1

## 2013-02-17 MED ORDER — PHENYLEPHRINE HCL 10 MG/ML IJ SOLN
INTRAMUSCULAR | Status: DC | PRN
  Administered 2013-02-17: 80 ug via INTRAVENOUS

## 2013-02-17 MED ORDER — PROPOFOL 10 MG/ML IV BOLUS
INTRAVENOUS | Status: DC | PRN
  Administered 2013-02-17: 100 mg via INTRAVENOUS

## 2013-02-17 MED ORDER — ENOXAPARIN SODIUM 40 MG/0.4ML ~~LOC~~ SOLN
40.0000 mg | SUBCUTANEOUS | Status: DC
  Administered 2013-02-18 – 2013-02-21 (×4): 40 mg via SUBCUTANEOUS
  Filled 2013-02-17 (×5): qty 0.4

## 2013-02-17 SURGICAL SUPPLY — 24 items
BANDAGE ELASTIC 4 VELCRO ST LF (GAUZE/BANDAGES/DRESSINGS) ×2 IMPLANT
BANDAGE GAUZE ELAST BULKY 4 IN (GAUZE/BANDAGES/DRESSINGS) ×2 IMPLANT
BENZOIN TINCTURE PRP APPL 2/3 (GAUZE/BANDAGES/DRESSINGS) ×2 IMPLANT
BIT DRILL CANN LRG QC 5X300 (BIT) ×2 IMPLANT
BNDG COHESIVE 6X5 TAN STRL LF (GAUZE/BANDAGES/DRESSINGS) ×2 IMPLANT
CLSR STERI-STRIP ANTIMIC 1/2X4 (GAUZE/BANDAGES/DRESSINGS) ×2 IMPLANT
DRSG MEPILEX BORDER 4X4 (GAUZE/BANDAGES/DRESSINGS) ×2 IMPLANT
DURAPREP 26ML APPLICATOR (WOUND CARE) ×2 IMPLANT
GLOVE BIOGEL PI IND STRL 6.5 (GLOVE) ×1 IMPLANT
GLOVE BIOGEL PI INDICATOR 6.5 (GLOVE) ×1
GLOVE ORTHO TXT STRL SZ7.5 (GLOVE) ×2 IMPLANT
GLOVE SURG ORTHO 8.0 STRL STRW (GLOVE) ×4 IMPLANT
KIT BASIN OR (CUSTOM PROCEDURE TRAY) ×2 IMPLANT
KIT ROOM TURNOVER OR (KITS) ×2 IMPLANT
PACK GENERAL/GYN (CUSTOM PROCEDURE TRAY) ×2 IMPLANT
SCREW CANN 16 THRD/90 7.3 (Screw) ×4 IMPLANT
SCREW CANN 16 THRD/95 7.3 (Screw) ×2 IMPLANT
SCREW LOCK (Screw) ×3 IMPLANT
SCREW LOCK FT 75X4.5XSLD ST NS (Screw) ×3 IMPLANT
SUT MNCRL AB 4-0 PS2 18 (SUTURE) ×2 IMPLANT
SUT VIC AB 3-0 SH 27 (SUTURE) ×1
SUT VIC AB 3-0 SH 27X BRD (SUTURE) ×1 IMPLANT
TOWEL OR 17X24 6PK STRL BLUE (TOWEL DISPOSABLE) ×2 IMPLANT
TOWEL OR 17X26 10 PK STRL BLUE (TOWEL DISPOSABLE) ×2 IMPLANT

## 2013-02-17 NOTE — Transfer of Care (Signed)
Immediate Anesthesia Transfer of Care Note  Patient: [name]  Procedure(s) Performed: Procedure(s): PERCUTANEOUS PINNING HIP (Left)  Patient Location: PACU  Anesthesia Type:General  Level of Consciousness: awake, alert , oriented and patient cooperative  Airway & Oxygen Therapy: Patient Spontanous Breathing and Patient connected to nasal cannula oxygen  Post-op Assessment: Report given to PACU RN, Post -op Vital signs reviewed and stable and Patient moving all extremities X 4  Post vital signs: Reviewed and stable  Complications: No apparent anesthesia complications

## 2013-02-17 NOTE — Progress Notes (Signed)
Called to get report, informed by secretary that pt is transferring to [facility]

## 2013-02-17 NOTE — Anesthesia Preprocedure Evaluation (Addendum)
Anesthesia Evaluation  Patient identified by MRN, date of birth, ID band Patient awake    Reviewed: Allergy & Precautions, H&P , NPO status , Patient's Chart, lab work & pertinent test results  Airway Mallampati: II TM Distance: >3 FB Neck ROM: Full    Dental  (+) Chipped and Loose   Pulmonary          Cardiovascular     Neuro/Psych    GI/Hepatic GERD-  ,  Endo/Other    Renal/GU      Musculoskeletal   Abdominal   Peds  Hematology   Anesthesia Other Findings Prostate  Reproductive/Obstetrics                         Anesthesia Physical Anesthesia Plan  ASA: II  Anesthesia Plan: General   Post-op Pain Management:    Induction: Intravenous  Airway Management Planned: Oral ETT  Additional Equipment:   Intra-op Plan:   Post-operative Plan: Extubation in OR  Informed Consent: I have reviewed the patients History and Physical, chart, labs and discussed the procedure including the risks, benefits and alternatives for the proposed anesthesia with the patient or authorized representative who has indicated his/her understanding and acceptance.   Dental advisory given  Plan Discussed with: CRNA, Anesthesiologist and Surgeon  Anesthesia Plan Comments:        Anesthesia Quick Evaluation

## 2013-02-17 NOTE — Consult Note (Signed)
ORTHOPAEDIC CONSULTATION  REQUESTING PHYSICIAN: Derwood Kaplan, MD  Chief Complaint: Left hip pain  HPI: [name] is a 79 y.o. male who complains of  left hip pain after a mechanical fall last night. He was unable to walk. Pain is located directly over the left hip, rated as moderate to severe with activity. Denies any pre-existing hip pain. He normally is able to ambulate independently, and lives with his family. Denies any loss of consciousness or chest pain or shortness of breath. Pain is improved with rest.  Past Medical History  Diagnosis Date  . BPH (benign prostatic hyperplasia)   . GERD (gastroesophageal reflux disease)   . Hyperlipidemia    Past Surgical History  Procedure Laterality Date  . Colon surgery     History   Social History  . Marital Status: Married    Spouse Name: N/A    Number of Children: N/A  . Years of Education: N/A   Social History Main Topics  . Smoking status: Never Smoker   . Smokeless tobacco: None  . Alcohol Use: No  . Drug Use: No  . Sexual Activity: None   Other Topics Concern  . None   Social History Narrative  . None   History reviewed. No pertinent family history. mother died of a diverticular bleed. No Known Allergies Prior to Admission medications   Medication Sig Start Date End Date Taking? Authorizing Provider  finasteride (PROSCAR) 5 MG tablet Take 5 mg by mouth daily.   Yes Historical Provider, MD  tamsulosin (FLOMAX) 0.4 MG CAPS Take 0.4 mg by mouth daily.   Yes Historical Provider, MD   Dg Chest 1 View  02/17/2013   *RADIOLOGY REPORT*  Clinical Data: Fall  CHEST - 1 VIEW  Comparison: None.  Findings: Cardiomediastinal silhouette appears normal.  No acute pulmonary disease is noted.  Bony thorax is intact.  IMPRESSION: No acute cardiopulmonary abnormality seen.   Original Report Authenticated By: Lupita Raider.,  M.D.   Dg Hip Complete Left  02/17/2013   *RADIOLOGY REPORT*  Clinical Data: Fall, left hip pain   LEFT HIP - COMPLETE 2+ VIEW  Comparison: 11/26/2012 CT exam  Findings:  Degenerative changes noted of both hips.  Bones are osteopenic.  There is a nondisplaced radiolucent fracture line of the left hip subcapital femoral neck. No associated subluxation or dislocation.  Right pelvic calcifications noted consistent with chronic bladder calculi.  IMPRESSION: Acute nondisplaced left subcapital femoral neck fracture   Original Report Authenticated By: Judie Petit. Miles Costain, M.D.   Ct Head Wo Contrast  02/17/2013   *RADIOLOGY REPORT*  Clinical Data: Fall.  CT HEAD WITHOUT CONTRAST  Technique:  Contiguous axial images were obtained from the base of the skull through the vertex without contrast.  Comparison: No comparison head CT.  Comparison sinus CT 04/08/2011.  Findings: Motion degraded exam.  No obvious skull fracture or intracranial hemorrhage.  Small scalp hematoma right parietal region suspected.  Atrophy.  Ventricular prominence felt to represent combination of atrophy and mild hydrocephalus.  Small vessel disease type changes without CT evidence of large acute infarct.  No intracranial mass lesion detected on this unenhanced exam.  IMPRESSION: Motion degraded exam.  No obvious skull fracture or intracranial hemorrhage.  Small scalp hematoma right parietal region suspected.  Atrophy.  Ventricular prominence felt to represent combination of atrophy and mild hydrocephalus.  Small vessel disease type changes without CT evidence of large acute infarct.   Original Report Authenticated By: Lacy Duverney, M.D.  Positive ROS: All other systems have been reviewed and were otherwise negative with the exception of those mentioned in the HPI and as above.  Physical Exam: General: Alert, no acute distress, he does appear frail, and moderately cachectic. Cardiovascular: Mild pedal edema Respiratory: No cyanosis, no use of accessory musculature GI: No organomegaly, abdomen is soft and non-tender Skin: No lesions in the area of  chief complaint Neurologic: Sensation intact distally Psychiatric: Patient is competent for consent with normal mood and affect Lymphatic: No axillary or cervical lymphadenopathy  MUSCULOSKELETAL: Left hip has positive log roll, pain to palpation around the greater trochanter. Right hip is nontender. EHL and FHL are firing.  Assessment: Valgus impacted left femoral neck fracture, minimally displaced, history of colon cancer  Plan: This is an acute severe injury, which carries risk for both morbidity and mortality. I recommended surgical intervention in order to optimize long-term ambulatory function. I discussed internal fixation versus arthroplasty, and given the valgus impacted nature, minimal displacement, I have recommended percutaneous pinning, utilizing the risk for avascular necrosis, and loss of fixation, and the potential for conversion to arthroplasty.  Nonetheless, the family has elected for internal fixation. There is not currently time this morning at [facility] along the operating room, and so I will have the patient transferred to [facility], where there is time available at 12:00 today. We will also plan for admission by the hospitalist service, and plan to follow the routine hip fracture pathway.   The risks benefits and alternatives were discussed with the patient including but not limited to the risks of nonoperative treatment, versus surgical intervention including infection, bleeding, nerve injury, malunion, nonunion, the need for revision surgery, hardware prominence, hardware failure, the need for hardware removal, blood clots, cardiopulmonary complications, morbidity, mortality, among others, and they were willing to proceed.       Eulas Post, MD Cell [phone]   02/17/2013 9:12 AM

## 2013-02-17 NOTE — ED Notes (Signed)
Bed: [facility] Expected date:  Expected time:  Means of arrival:  Comments: EMS-hip pain/fever

## 2013-02-17 NOTE — ED Notes (Signed)
Per EMS pt had fall at home c/o of Left hip pain. Pt has also c/o shakiness and chills. Temp 101.1.

## 2013-02-17 NOTE — ED Notes (Signed)
Patient transported to X-ray 

## 2013-02-17 NOTE — H&P (Signed)
[facility] History and Physical  DUVID SMALLS ZOX:[id number] DOB: May 08, 1926 DOA: 02/17/2013  Referring physician: Dr. Rhunette Croft PCP: Leo Grosser, MD  Specialists: Ortho surgery  Chief Complaint: Hip pain after fall  HPI: [name] is a 79 y.o. male has a past medical history significant for BPH, GERD comes in with a chief complaint of a ground-level fall at home. States at around 3 AM, she was trying to get to the bathroom, he fell out of bed and he called this time as he couldn't get up. Denies loss of consciousness. Denies any chest pain, nausea vomiting or diarrhea. He denies shortness of breath. Other the left hip pain, he doesn't have any pain anywhere else. He denies any fever or chills. There is a report in the medical record that he endorsed a fever of 101 at home, however patient denies this to me. He denies having a history of heart attacks, strokes in the past, denies that it is of high blood pressure, and considers himself with the health otherwise. He is able to walk on his own at baseline.  Review of Systems: As per history of present illness, otherwise negative.  Past Medical History  Diagnosis Date  . BPH (benign prostatic hyperplasia)   . GERD (gastroesophageal reflux disease)   . Hyperlipidemia    Past Surgical History  Procedure Laterality Date  . Colon surgery     Social History:  reports that he has never smoked. He does not have any smokeless tobacco history on file. He reports that he does not drink alcohol or use illicit drugs.  No Known Allergies  History reviewed. No pertinent family history.   Prior to Admission medications   Medication Sig Start Date End Date Taking? Authorizing Provider  finasteride (PROSCAR) 5 MG tablet Take 5 mg by mouth daily.   Yes Historical Provider, MD  tamsulosin (FLOMAX) 0.4 MG CAPS Take 0.4 mg by mouth daily.   Yes Historical Provider, MD   Physical Exam: Filed Vitals:   02/17/13 0705  BP: 160/66   Pulse: 117  Temp: 98.4 F (36.9 C)  TempSrc: Oral  Resp: 18     General:  No apparent distress, pleasant African American male  Eyes: PERRL, EOMI, no scleral icterus  ENT: moist oropharynx, very poor dentition  Neck: supple,  Cardiovascular: regular rate without MRG; 2+ peripheral pulses  Respiratory: CTA biL, good air movement without wheezing, rhonchi or crackled  Abdomen: soft, non tender to palpation, positive bowel sounds, no guarding, no rebound  Skin: no rashes  Musculoskeletal: no peripheral edema  Psychiatric: normal mood and affect  Neurologic: Nonfocal  Labs on Admission:  Basic Metabolic Panel:  Recent Labs Lab 02/17/13 0850  NA 135  K 3.7  CL 102  CO2 26  GLUCOSE 109*  BUN 18  CREATININE 0.87  CALCIUM 9.4   Liver Function Tests: No results found for this basename: AST, ALT, ALKPHOS, BILITOT, PROT, ALBUMIN,  in the last 168 hours No results found for this basename: LIPASE, AMYLASE,  in the last 168 hours No results found for this basename: AMMONIA,  in the last 168 hours CBC:  Recent Labs Lab 02/17/13 0850  WBC 10.0  NEUTROABS 9.6*  HGB 13.7  HCT 39.7  MCV 94.5  PLT 113*   Cardiac Enzymes:  Recent Labs Lab 02/17/13 0850  TROPONINI <0.30   Radiological Exams on Admission: Dg Chest 1 View  02/17/2013   *RADIOLOGY REPORT*  Clinical Data: Fall  CHEST - 1 VIEW  Comparison: None.  Findings: Cardiomediastinal silhouette appears normal.  No acute pulmonary disease is noted.  Bony thorax is intact.  IMPRESSION: No acute cardiopulmonary abnormality seen.   Original Report Authenticated By: Lupita Raider.,  M.D.   Dg Hip Complete Left  02/17/2013   *RADIOLOGY REPORT*  Clinical Data: Fall, left hip pain  LEFT HIP - COMPLETE 2+ VIEW  Comparison: 11/26/2012 CT exam  Findings:  Degenerative changes noted of both hips.  Bones are osteopenic.  There is a nondisplaced radiolucent fracture line of the left hip subcapital femoral neck. No associated  subluxation or dislocation.  Right pelvic calcifications noted consistent with chronic bladder calculi.  IMPRESSION: Acute nondisplaced left subcapital femoral neck fracture   Original Report Authenticated By: Judie Petit. Miles Costain, M.D.   Ct Head Wo Contrast  02/17/2013   *RADIOLOGY REPORT*  Clinical Data: Fall.  CT HEAD WITHOUT CONTRAST  Technique:  Contiguous axial images were obtained from the base of the skull through the vertex without contrast.  Comparison: No comparison head CT.  Comparison sinus CT 04/08/2011.  Findings: Motion degraded exam.  No obvious skull fracture or intracranial hemorrhage.  Small scalp hematoma right parietal region suspected.  Atrophy.  Ventricular prominence felt to represent combination of atrophy and mild hydrocephalus.  Small vessel disease type changes without CT evidence of large acute infarct.  No intracranial mass lesion detected on this unenhanced exam.  IMPRESSION: Motion degraded exam.  No obvious skull fracture or intracranial hemorrhage.  Small scalp hematoma right parietal region suspected.  Atrophy.  Ventricular prominence felt to represent combination of atrophy and mild hydrocephalus.  Small vessel disease type changes without CT evidence of large acute infarct.   Original Report Authenticated By: Lacy Duverney, M.D.    EKG: Independently reviewed.  Assessment/Plan Active Problems:   BPH (benign prostatic hyperplasia)   GERD (gastroesophageal reflux disease)   Hyperlipidemia   Hip fracture  Hip fracture - Orthopedics have been consulted by the ED physician, patient was transferred to [facility] for surgery  BPH - hold home medications  Thrombocytopenia  - unclear significance, monitor. No previous values to compare to.  DVT prophylaxis - holding perioperatively, restart as indicated by orthopedics team  Code Status: Presumed full  Family Communication: None  Disposition Plan: Admission to [facility], talked with Dr. Isidoro Donning from [facility] team 6.  Time  spent: 34  [place] Dost M. Elvera Lennox, MD [facility] Pager [phone]  If 7PM-7AM, please contact night-coverage [email or web address] Password [facility] 02/17/2013, 9:55 AM

## 2013-02-17 NOTE — Anesthesia Procedure Notes (Signed)
Procedure Name: Intubation Date/Time: 02/17/2013 12:39 PM Performed by: Rogelia Boga Pre-anesthesia Checklist: Patient identified, Emergency Drugs available, Suction available, Patient being monitored and Timeout performed Patient Re-evaluated:Patient Re-evaluated prior to inductionOxygen Delivery Method: Circle system utilized Preoxygenation: Pre-oxygenation with 100% oxygen Intubation Type: IV induction Ventilation: Mask ventilation without difficulty Laryngoscope Size: Mac and 4 Grade View: Grade II Tube type: Oral Tube size: 7.5 mm Number of attempts: 1 Airway Equipment and Method: Stylet Secured at: 23 cm Tube secured with: Tape Dental Injury: Teeth and Oropharynx as per pre-operative assessment

## 2013-02-17 NOTE — Preoperative (Signed)
Beta Blockers   Reason not to administer Beta Blockers:Not Applicable 

## 2013-02-17 NOTE — Op Note (Signed)
02/17/2013  1:34 PM  PATIENT:  [name]    PRE-OPERATIVE DIAGNOSIS: left femoral neck fracture fx  POST-OPERATIVE DIAGNOSIS:  Same  PROCEDURE:  PERCUTANEOUS PINNING HIP  SURGEON:  Eulas Post, MD  PHYSICIAN ASSISTANT: Janace Litten, OPA-C, present and scrubbed throughout the case, critical for completion in a timely fashion, and for retraction, instrumentation, and closure.  ANESTHESIA:   General  PREOPERATIVE INDICATIONS:  [name] is a  79 y.o. male who fell and was found to have a diagnosis of lt hip fx who elected for surgical management.    The risks benefits and alternatives were discussed with the patient preoperatively including but not limited to the risks of infection, bleeding, nerve injury, cardiopulmonary complications, blood clots, malunion, nonunion, avascular necrosis, the need for revision surgery, the potential for conversion to hemiarthroplasty, among others, and the patient was willing to proceed.  OPERATIVE IMPLANTS: 7.3 mm cannulated screws x3  OPERATIVE FINDINGS: Clinical osteoporosis with mild weak bone, proximal femur  OPERATIVE PROCEDURE: The patient was brought to the operating room and placed in supine position. IV antibiotics were given. General anesthesia administered. Foley was not placed. The patient was placed on the fracture table. The operative extremity was positioned, without any significant reduction maneuver and was prepped and draped in usual sterile fashion.  Time out was performed.  Small incision was made distal to the greater trochanter, and 3 guidewires were introduced Into an inverted triangle configuration. The lengths were measured. The reduction was slightly valgus, and near-anatomic. I opened the cortex with a cannulated drill, and then placed the screws into position. Satisfactory fixation was achieved.  The wounds were irrigated copiously, and repaired with Vicryl with Steri-Strips and sterile gauze. There no  complications and the patient tolerated the procedure well.  The patient will be weightbearing as tolerated, and will be on Lovenox for a period of 3 weeks after discharge for DVT prophylaxis.

## 2013-02-17 NOTE — ED Provider Notes (Signed)
CSN: [id number]     Arrival date & time 02/17/13  7829 History     First MD Initiated Contact with Patient 02/17/13 0735     Chief Complaint  Patient presents with  . Fall   (Consider location/radiation/quality/duration/timing/severity/associated sxs/prior Treatment) HPI Comments: Pt comes in with cc of fall. Pt is relatively healthy, has HL, BPH. States that he fell from the bed around 3 am, called his family at 5:30 am as he couldn't get up. Has left sided hip pain, no pail elsewhere. Pt denies any headaches, LOC, neck pain. No chest pain, dib, cough. No UTI like sx.  Patient is a 79 y.o. male presenting with fall. The history is provided by the patient.  Fall Pertinent negatives include no chest pain, no abdominal pain and no shortness of breath.    Past Medical History  Diagnosis Date  . BPH (benign prostatic hyperplasia)   . GERD (gastroesophageal reflux disease)   . Hyperlipidemia    Past Surgical History  Procedure Laterality Date  . Colon surgery     History reviewed. No pertinent family history. History  Substance Use Topics  . Smoking status: Never Smoker   . Smokeless tobacco: Not on file  . Alcohol Use: No    Review of Systems  Constitutional: Positive for activity change. Negative for appetite change.  Respiratory: Negative for cough and shortness of breath.   Cardiovascular: Negative for chest pain.  Gastrointestinal: Negative for abdominal pain.  Genitourinary: Negative for dysuria.  Musculoskeletal: Positive for arthralgias and gait problem.  Skin: Negative for wound.  Neurological: Negative for dizziness and syncope.    Allergies  Review of patient's allergies indicates no known allergies.  Home Medications   Current Outpatient Rx  Name  Route  Sig  Dispense  Refill  . finasteride (PROSCAR) 5 MG tablet   Oral   Take 5 mg by mouth daily.         . tamsulosin (FLOMAX) 0.4 MG CAPS   Oral   Take 0.4 mg by mouth daily.          BP 160/66   Pulse 117  Temp(Src) 98.4 F (36.9 C) (Oral)  Resp 18 Physical Exam  Nursing note and vitals reviewed. Constitutional: He is oriented to person, place, and time. He appears well-developed.  HENT:  Head: Normocephalic and atraumatic.  Eyes: Conjunctivae and EOM are normal. Pupils are equal, round, and reactive to light.  Neck: Normal range of motion. Neck supple.  Cardiovascular: Normal rate, regular rhythm and intact distal pulses.   Pulmonary/Chest: Effort normal and breath sounds normal.  Abdominal: Soft. Bowel sounds are normal. He exhibits no distension. There is no tenderness. There is no rebound and no guarding.  Musculoskeletal:  Left hip tenderness Head to toe evaluation shows no hematoma, bleeding of the scalp, no facial abrasions, step offs, crepitus, no tenderness to palpation of the bilateral upper extremities, no gross deformities, no chest tenderness, no pelvic pain.  Neurological: He is alert and oriented to person, place, and time.  Skin: Skin is warm.    ED Course   Procedures (including critical care time)  Labs Reviewed  CBC WITH DIFFERENTIAL  BASIC METABOLIC PANEL  URINALYSIS, ROUTINE W REFLEX MICROSCOPIC  TROPONIN I   Ct Head Wo Contrast  02/17/2013   *RADIOLOGY REPORT*  Clinical Data: Fall.  CT HEAD WITHOUT CONTRAST  Technique:  Contiguous axial images were obtained from the base of the skull through the vertex without contrast.  Comparison: No comparison  head CT.  Comparison sinus CT 04/08/2011.  Findings: Motion degraded exam.  No obvious skull fracture or intracranial hemorrhage.  Small scalp hematoma right parietal region suspected.  Atrophy.  Ventricular prominence felt to represent combination of atrophy and mild hydrocephalus.  Small vessel disease type changes without CT evidence of large acute infarct.  No intracranial mass lesion detected on this unenhanced exam.  IMPRESSION: Motion degraded exam.  No obvious skull fracture or intracranial hemorrhage.   Small scalp hematoma right parietal region suspected.  Atrophy.  Ventricular prominence felt to represent combination of atrophy and mild hydrocephalus.  Small vessel disease type changes without CT evidence of large acute infarct.   Original Report Authenticated By: Lacy Duverney, M.D.   No diagnosis found.  MDM  Pt comes in with cc of fall. DDx includes: - Mechanical falls - ICH - Fractures - Contusions - Soft tissue injury  Pt has left sided hip pain. Xrays appears to be showing a hip fracture. Ortho Consulted.  9:19 AM Dr. Dion Saucier to perform surgery at noon - at [facility]. Will be transferred. IM called.   Date: 02/17/2013  Rate: 114  Rhythm: sinus tachycardia  QRS Axis: left  Intervals: normal  ST/T Wave abnormalities: nonspecific ST/T changes  Conduction Disutrbances:VENTRICULAR BIGEMINY  Narrative Interpretation:   Old EKG Reviewed: none available    Derwood Kaplan, MD 02/17/13 [phone]

## 2013-02-17 NOTE — Anesthesia Postprocedure Evaluation (Signed)
  Anesthesia Post-op Note  Patient: [name]  Procedure(s) Performed: Procedure(s): PERCUTANEOUS PINNING HIP (Left)  Patient Location: PACU  Anesthesia Type:General  Level of Consciousness: awake, sedated and patient cooperative  Airway and Oxygen Therapy: Patient Spontanous Breathing  Post-op Pain: none  Post-op Assessment: Post-op Vital signs reviewed, Patient's Cardiovascular Status Stable, Respiratory Function Stable, Patent Airway, No signs of Nausea or vomiting and Pain level controlled  Post-op Vital Signs: stable  Complications: No apparent anesthesia complications

## 2013-02-18 ENCOUNTER — Encounter (HOSPITAL_COMMUNITY): Payer: Self-pay | Admitting: Orthopedic Surgery

## 2013-02-18 DIAGNOSIS — N400 Enlarged prostate without lower urinary tract symptoms: Secondary | ICD-10-CM

## 2013-02-18 DIAGNOSIS — S72009S Fracture of unspecified part of neck of unspecified femur, sequela: Secondary | ICD-10-CM

## 2013-02-18 DIAGNOSIS — K219 Gastro-esophageal reflux disease without esophagitis: Secondary | ICD-10-CM

## 2013-02-18 LAB — BASIC METABOLIC PANEL
BUN: 17 mg/dL (ref 6–23)
Chloride: 103 mEq/L (ref 96–112)
GFR calc Af Amer: >90 mL/min (ref >90)
Potassium: 4.3 mEq/L (ref 3.5–5.1)
Sodium: 136 mEq/L (ref 135–145)

## 2013-02-18 LAB — CBC
HCT: 34.7 % — ABNORMAL LOW (ref 39.0–52.0)
RDW: 13.3 % (ref 11.5–15.5)
WBC: 7 10*3/uL (ref 4.0–10.5)

## 2013-02-18 NOTE — Evaluation (Signed)
Clinical/Bedside Swallow Evaluation Patient Details  Name: [name] MRN: [id number] Date of Birth: 1926/01/10  Today's Date: 02/18/2013 Time: 1035-1105 SLP Time Calculation (min): 30 min  Past Medical History:  Past Medical History  Diagnosis Date  . BPH (benign prostatic hyperplasia)   . GERD (gastroesophageal reflux disease)   . Hyperlipidemia   . Fracture of femoral neck, left 02/17/2013   Past Surgical History:  Past Surgical History  Procedure Laterality Date  . Colon surgery     HPI:  Pt an 79 y.o. male has a PMH: BPH, GERD admitted following fall with fracture of left hip.    Assessment / Plan / Recommendation Clinical Impression   Patient presents with a mild pharyngeal dysphagia, with primary impact of dysphagia likely esophageal. Patient with h/o GERD, and stated that he has sensation of solid texture food becoming briefly stuck in throat, but that it transits following second dry swallow/liquid swallow. Patient exhibited delayed cough response following bites of regular solids (cracker) and sips of thin liquids. Suspect that cough caused by mixing of thin and regular consistencies in laryngeal vestibule. Patient verbalized understanding of reflux precautions, and stated that "I do all those already". He stated that he eats foods that have been cut up into smaller bites.     Aspiration Risk  Mild    Diet Recommendation Dysphagia 3 (Mechanical Soft);Thin liquid   Liquid Administration via: Cup Medication Administration: Whole meds with puree Supervision: Patient able to self feed Compensations: Slow rate;Small sips/bites;Follow solids with liquid Postural Changes and/or Swallow Maneuvers: Seated upright 90 degrees;Upright 30-60 min after meal    Other  Recommendations     Follow Up Recommendations  Other (comment) (SNF vs HH if dysphagia persists)    Frequency and Duration min 1 x/week  1 week   Pertinent Vitals/Pain     SLP Swallow Goals Patient will  consume recommended diet without observed clinical signs of aspiration with: Set-up;Modified independent assistance Patient will utilize recommended strategies during swallow to increase swallowing safety with: Modified independent assistance   Swallow Study Prior Functional Status       General Date of Onset: 02/17/13 HPI: Pt an 79 y.o. male has a PMH: BPH, GERD admitted following fall with fracture of left hip.  Type of Study: Bedside swallow evaluation Previous Swallow Assessment: N/A Diet Prior to this Study: Regular;Thin liquids Temperature Spikes Noted: No Respiratory Status: Room air History of Recent Intubation: No Behavior/Cognition: Alert;Cooperative;Pleasant mood Oral Cavity - Dentition: Missing dentition;Poor condition (top-edentulous, bottom-2-3 teeth, poor condition) Self-Feeding Abilities: Needs set up Patient Positioning: Upright in bed Baseline Vocal Quality: Clear Volitional Cough: Strong Volitional Swallow: Able to elicit    Oral/Motor/Sensory Function Overall Oral Motor/Sensory Function: Appears within functional limits for tasks assessed Labial ROM: Within Functional Limits Labial Symmetry: Within Functional Limits Labial Strength: Within Functional Limits Labial Sensation: Within Functional Limits Lingual ROM: Within Functional Limits Lingual Symmetry: Within Functional Limits Lingual Strength: Within Functional Limits Lingual Sensation: Within Functional Limits Facial ROM: Within Functional Limits Facial Symmetry: Within Functional Limits Facial Strength: Within Functional Limits Facial Sensation: Within Functional Limits Velum: Within Functional Limits Mandible: Within Functional Limits   Ice Chips Ice chips: Not tested   Thin Liquid Thin Liquid: Impaired Presentation: Cup;Straw Pharyngeal  Phase Impairments: Suspected delayed Swallow    Nectar Thick Nectar Thick Liquid: Not tested   Honey Thick Honey Thick Liquid: Not tested   Puree Puree: Within  functional limits Presentation: Spoon Other Comments: No overt s/s aspiration observed  Solid   GO    Solid: Impaired Presentation: Self Fed Pharyngeal Phase Impairments: Throat Clearing - Delayed Other Comments: Pt. c/o feeling like solid bolus "sticks" in throat briefly, but cleared with subsequent dry swallow. Patient exhibited delayed cough with regular solids when drinking thin liquids (mixed consistency)       [name] [name] 02/18/2013,12:53 PM  George Nevin, MA, CCC-SLP [name] Speech-Language Pathologist

## 2013-02-18 NOTE — Progress Notes (Signed)
Patient ID: [name], male   DOB: 12-Aug-1925, 79 y.o.   MRN: [id number]     Subjective:  Patient reports pain as mild to moderate.  Patient alert and follows commands.  He states that he is just sore in his left thigh.  Objective:   VITALS:   Filed Vitals:   02/17/13 2023 02/18/13 0000 02/18/13 0320 02/18/13 0400  BP: 136/93  110/62   Pulse: 88  86   Temp: 98.3 F (36.8 C)  98.8 F (37.1 C)   TempSrc: Oral  Oral   Resp: 16 16 16 16   SpO2: 94%  97%     ABD soft Sensation intact distally Dorsiflexion/Plantar flexion intact Incision: moderate drainage Dressing re-enforced during the night.   Lab Results  Component Value Date   WBC 7.0 02/18/2013   HGB 11.8* 02/18/2013   HCT 34.7* 02/18/2013   MCV 94.0 02/18/2013   PLT 92* 02/18/2013     Assessment/Plan: 1 Day Post-Op   Principal Problem:   Fracture of femoral neck, left Active Problems:   BPH (benign prostatic hyperplasia)   GERD (gastroesophageal reflux disease)   Hyperlipidemia   Advance diet Up with therapy Plan dressing change tomorrow  Plan for SNF. Continue plan per medicine   Haskel Khan 02/18/2013, 7:15 AM   Teryl Lucy, MD Cell [phone]

## 2013-02-18 NOTE — Evaluation (Signed)
Physical Therapy Evaluation Patient Details Name: [name] MRN: [id number] DOB: 1925/12/25 Today's Date: 02/18/2013 Time: 3664-4034 PT Time Calculation (min): 22 min  PT Assessment / Plan / Recommendation History of Present Illness   79 y.o. male hx of BPH, GERD comes in with a chief complaint of a ground-level fall at home. States at around 3 AM, he was trying to get to the bathroom, he fell getting out of bed and he called because he couldn't get up. Denies loss of consciousness. pt s/p PERCUTANEOUS PINNING HIP (Left).He is able to walk on his own at baseline. 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  Clinical Impression  Pt. Initially anxious in anticipation of pain but was able to tolerate functional mobility and gait without significant pain.Marland Kitchen He presents to PT with dependencies in his mobility and gait due to fall and surgery and will benefit from acute Pt to address these and below areas.      PT Assessment  Patient needs continued PT services    Follow Up Recommendations  SNF;Supervision/Assistance - 24 hour    Does the patient have the potential to tolerate intense rehabilitation      Barriers to Discharge Decreased caregiver support      Equipment Recommendations  None recommended by PT    Recommendations for Other Services     Frequency Min 6X/week    Precautions / Restrictions Precautions Precautions: Fall Restrictions Weight Bearing Restrictions: Yes LLE Weight Bearing: Weight bearing as tolerated   Pertinent Vitals/Pain See vitals tab       Mobility  Bed Mobility Bed Mobility: Supine to Sit;Sitting - Scoot to Edge of Bed Supine to Sit: 1: +2 Total assist;HOB elevated Supine to Sit: Patient Percentage: 30% Sitting - Scoot to Edge of Bed: 2: Max assist Details for Bed Mobility Assistance: pt. with anxiousness due to anticipated pain.  Heavy use of bed pad for assist Transfers Transfers: Sit to Stand;Stand to Sit Sit to Stand: 1: +2 Total  assist;With upper extremity assist;From bed Sit to Stand: Patient Percentage: 70% Stand to Sit: 1: +2 Total assist;With upper extremity assist;To chair/3-in-1 Stand to Sit: Patient Percentage: 70% Details for Transfer Assistance: cues for hand placement and safety Ambulation/Gait Ambulation/Gait Assistance: 4: Min assist Ambulation Distance (Feet): 8 Feet Assistive device: Rolling walker Ambulation/Gait Assistance Details: cues for sequencing and use of RW, min assist for balance and stabillity Gait Pattern: Step-to pattern;Decreased step length - left;Decreased stance time - left    Exercises General Exercises - Lower Extremity Ankle Circles/Pumps: AROM;10 reps;Both Quad Sets: AROM;Left;10 reps   PT Diagnosis: Difficulty walking;Abnormality of gait;Acute pain  PT Problem List: Decreased strength;Decreased activity tolerance;Decreased balance;Decreased mobility;Decreased knowledge of use of DME;Pain PT Treatment Interventions: DME instruction;Gait training;Functional mobility training;Therapeutic activities;Therapeutic exercise;Balance training;Patient/family education     PT Goals(Current goals can be found in the care plan section) Acute Rehab PT Goals Patient Stated Goal: to return home with wife PT Goal Formulation: With patient Time For Goal Achievement: 02/25/13 Potential to Achieve Goals: Good  Visit Information  Last PT Received On: 02/18/13 Assistance Needed: +2 History of Present Illness:  79 y.o. male hx of BPH, GERD comes in with a chief complaint of a ground-level fall at home. States at around 3 AM, he was trying to get to the bathroom, he fell getting out of bed and he called because he couldn't get up. Denies loss of consciousness. pt s/p PERCUTANEOUS PINNING HIP (Left).He is able to walk on his own at baseline. Pt  is primary caregiver for elderly wife.       Prior Functioning  Home Living Family/patient expects to be discharged to:: Skilled nursing  facility Prior Function Level of Independence: Independent with assistive device(s) Communication Communication: No difficulties;Other (comment) Dominant Hand: Right    Cognition  Cognition Arousal/Alertness: Awake/alert Behavior During Therapy: WFL for tasks assessed/performed Overall Cognitive Status: Within Functional Limits for tasks assessed    Extremity/Trunk Assessment Upper Extremity Assessment Upper Extremity Assessment: Overall WFL for tasks assessed Lower Extremity Assessment Lower Extremity Assessment: LLE deficits/detail LLE Deficits / Details: good ankle pump and quad set Cervical / Trunk Assessment Cervical / Trunk Assessment: Normal   Balance Balance Balance Assessed: Yes Static Standing Balance Static Standing - Balance Support: Bilateral upper extremity supported Static Standing - Level of Assistance: 5: Stand by assistance  End of Session PT - End of Session Equipment Utilized During Treatment: Gait belt Activity Tolerance: Patient tolerated treatment well Patient left: in chair;with call bell/phone within reach;with family/visitor present (asked family to notify nursing before they leave) Nurse Communication: Mobility status  GP     Ferman Hamming 02/18/2013, 3:27 PM Weldon Picking PT Acute Rehab Services [phone] Beeper [phone]

## 2013-02-18 NOTE — Progress Notes (Signed)
Patient ID: [name]  male  ZOX:[id number]    DOB: 1926/03/13    DOA: 02/17/2013  PCP: Leo Grosser, MD  Assessment/Plan: Principal Problem:   Fracture of femoral neck, left Active Problems:   BPH (benign prostatic hyperplasia)   GERD (gastroesophageal reflux disease)   Hyperlipidemia  Left femoral neck fracture postop day 1, - Status post percutaneous pinning hip  - per ortho dressing changes tomorrow, physical therapy -Currently on Lovenox for DVT prophylaxis (defer to ortho for DVT prophylaxis for discharge)   BPH - continue Proscar and Flomax  Thrombocytopenia  - unclear significance, monitor as they have started trending down   DVT Prophylaxis: currently Lovenox, PLT low and trending down   Code Status:  Disposition:SNF     Subjective: Denies any significant complaints, soreness in the left hip   Objective: Weight change:   Intake/Output Summary (Last 24 hours) at 02/18/13 1518 Last data filed at 02/18/13 1300  Gross per 24 hour  Intake 1503.75 ml  Output    450 ml  Net 1053.75 ml   Blood pressure 97/54, pulse 86, temperature 97.8 F (36.6 C), temperature source Oral, resp. rate 18, SpO2 93.00%.  Physical Exam: General: Alert and awake, NAD CVS: S1-S2 clear, no murmur rubs or gallops Chest: clear to auscultation bilaterally, no wheezing, rales or rhonchi Abdomen: soft nontender, nondistended, normal bowel sounds  Extremities: no cyanosis, clubbing or edema noted bilaterally   Lab Results: Basic Metabolic Panel:  Recent Labs Lab 02/17/13 0850 02/17/13 1745 02/18/13 0545  NA 135  --  136  K 3.7  --  4.3  CL 102  --  103  CO2 26  --  23  GLUCOSE 109*  --  97  BUN 18  --  17  CREATININE 0.87 0.84 0.74  CALCIUM 9.4  --  8.5   Liver Function Tests: No results found for this basename: AST, ALT, ALKPHOS, BILITOT, PROT, ALBUMIN,  in the last 168 hours No results found for this basename: LIPASE, AMYLASE,  in the last 168 hours No results  found for this basename: AMMONIA,  in the last 168 hours CBC:  Recent Labs Lab 02/17/13 0850 02/17/13 1745 02/18/13 0545  WBC 10.0 8.3 7.0  NEUTROABS 9.6*  --   --   HGB 13.7 12.7* 11.8*  HCT 39.7 37.3* 34.7*  MCV 94.5 93.7 94.0  PLT 113* 105* 92*   Cardiac Enzymes:  Recent Labs Lab 02/17/13 0850  TROPONINI <0.30   BNP: No components found with this basename: POCBNP,  CBG: No results found for this basename: GLUCAP,  in the last 168 hours   Micro Results: No results found for this or any previous visit (from the past 240 hour(s)).  Studies/Results: Dg Chest 1 View  02/17/2013   *RADIOLOGY REPORT*  Clinical Data: Fall  CHEST - 1 VIEW  Comparison: None.  Findings: Cardiomediastinal silhouette appears normal.  No acute pulmonary disease is noted.  Bony thorax is intact.  IMPRESSION: No acute cardiopulmonary abnormality seen.   Original Report Authenticated By: Lupita Raider.,  M.D.   Dg Hip Complete Left  02/17/2013   *RADIOLOGY REPORT*  Clinical Data: Fall, left hip pain  LEFT HIP - COMPLETE 2+ VIEW  Comparison: 11/26/2012 CT exam  Findings:  Degenerative changes noted of both hips.  Bones are osteopenic.  There is a nondisplaced radiolucent fracture line of the left hip subcapital femoral neck. No associated subluxation or dislocation.  Right pelvic calcifications noted consistent with chronic bladder  calculi.  IMPRESSION: Acute nondisplaced left subcapital femoral neck fracture   Original Report Authenticated By: Judie Petit. Shick, M.D.   Dg Hip Operative Left  02/17/2013   *RADIOLOGY REPORT*  Clinical Data: Hip fracture  OPERATIVE LEFT HIP  Comparison: 0800 hours  Findings: Three cancellous screws transfix a left femoral neck fracture.  Anatomic alignment.  No breakage or loosening of the hardware.  IMPRESSION: ORIF left femoral neck fracture.  Anatomic.   Original Report Authenticated By: Jolaine Click, M.D.   Ct Head Wo Contrast  02/17/2013   *RADIOLOGY REPORT*  Clinical Data:  Fall.  CT HEAD WITHOUT CONTRAST  Technique:  Contiguous axial images were obtained from the base of the skull through the vertex without contrast.  Comparison: No comparison head CT.  Comparison sinus CT 04/08/2011.  Findings: Motion degraded exam.  No obvious skull fracture or intracranial hemorrhage.  Small scalp hematoma right parietal region suspected.  Atrophy.  Ventricular prominence felt to represent combination of atrophy and mild hydrocephalus.  Small vessel disease type changes without CT evidence of large acute infarct.  No intracranial mass lesion detected on this unenhanced exam.  IMPRESSION: Motion degraded exam.  No obvious skull fracture or intracranial hemorrhage.  Small scalp hematoma right parietal region suspected.  Atrophy.  Ventricular prominence felt to represent combination of atrophy and mild hydrocephalus.  Small vessel disease type changes without CT evidence of large acute infarct.   Original Report Authenticated By: Lacy Duverney, M.D.   Dg Pelvis Portable  02/17/2013   *RADIOLOGY REPORT*  Clinical Data: Postop left hip pinning  PORTABLE PELVIS  Comparison: Preoperative pelvis and left hip films of 02/17/2013  Findings: Three compression nails are present through the left femoral head and neck for fixation of the left femoral neck fracture.  No complicating features are seen.  The pelvic rami are intact.  There are degenerative changes in both hip joints right greater than left.  Rounded faint calcifications in the pelvis could represent bladder calculi.  IMPRESSION: 1.  Pinning of left femoral neck fracture with no complicating features.  2.  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  3.  Question urinary bladder calculi.   Original Report Authenticated By: Dwyane Dee, M.D.   Dg Hip Portable 1 View Left  02/17/2013   *RADIOLOGY REPORT*  Clinical Data: Left hip fracture, status post fixation  PORTABLE LEFT HIP - 1 VIEW  Comparison: 02/17/2013  Findings:  Cross-table lateral view demonstrates screw fixation of the subcapital femoral neck fracture.  Alignment grossly anatomic.  IMPRESSION: Expected appearance status post left hip fracture screw fixation   Original Report Authenticated By: Judie Petit. Miles Costain, M.D.    Medications: Scheduled Meds: . docusate sodium  100 mg Oral BID  . enoxaparin (LOVENOX) injection  40 mg Subcutaneous Q24H  . finasteride  5 mg Oral Daily  . senna  1 tablet Oral BID  . tamsulosin  0.4 mg Oral Daily      LOS: 1 day   Lerline Valdivia M.D. [facility] 02/18/2013, 3:18 PM Pager: [phone]  If 7PM-7AM, please contact night-coverage [email or web address] Password [email or web address]

## 2013-02-18 NOTE — Evaluation (Signed)
Occupational Therapy Evaluation Patient Details Name: [name] MRN: [id number] DOB: 1925/12/31 Today's Date: 02/18/2013 Time: 9562-1308 OT Time Calculation (min): 25 min  OT Assessment / Plan / Recommendation History of present illness  79 y.o. male hx of BPH, GERD comes in with a chief complaint of a ground-level fall at home. States at around 3 AM, he was trying to get to the bathroom, he fell getting out of bed and he called because he couldn't get up. Denies loss of consciousness. pt s/p PERCUTANEOUS PINNING HIP (Left).He is able to walk on his own at baseline. 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   Clinical Impression  Patient is s/p Lt hip pinning surgery resulting in functional limitations due to the deficits listed below (see OT problem list). Patient will benefit from skilled OT acutely to increase independence and safety with ADLS to allow discharge SNF.      OT Assessment  Patient needs continued OT Services    Follow Up Recommendations  SNF    Barriers to Discharge      Equipment Recommendations  3 in 1 bedside comode;Wheelchair (measurements OT);Wheelchair cushion (measurements OT)    Recommendations for Other Services    Frequency  Min 2X/week    Precautions / Restrictions Precautions Precautions: Fall Restrictions Weight Bearing Restrictions: Yes LLE Weight Bearing: Weight bearing as tolerated   Pertinent Vitals/Pain Minimal pain Pt does report discomfort at Rt foot heel area Pt reports discomfort at surgerical site with mobility.    ADL  Eating/Feeding: Modified independent Where Assessed - Eating/Feeding: Chair Grooming: Modified independent Where Assessed - Grooming: Supported sitting Lower Body Bathing: +1 Total assistance Where Assessed - Lower Body Bathing: Supported sitting Toilet Transfer: +2 Total assistance Toilet Transfer: Patient Percentage: 70% Toilet Transfer Method: Sit to stand Toilet Transfer Equipment: Raised toilet seat with  arms (or 3-in-1 over toilet) Equipment Used: Gait belt;Rolling walker Transfers/Ambulation Related to ADLs: Pt ambulated with Rw from EOB to chair with cues for safety and education on using a RW. Pt normally uses a handmade cane. ADL Comments: Pt educated on sequence for bed mobility and progression to chair for lunch. Pt opening packets without (A)    OT Diagnosis: Generalized weakness;Acute pain  OT Problem List: Decreased strength;Decreased activity tolerance;Impaired balance (sitting and/or standing);Decreased safety awareness;Decreased knowledge of use of DME or AE;Decreased knowledge of precautions;Pain OT Treatment Interventions: Self-care/ADL training;Therapeutic exercise;DME and/or AE instruction;Therapeutic activities;Patient/family education;Balance training   OT Goals(Current goals can be found in the care plan section) Acute Rehab OT Goals Patient Stated Goal: to return home with wife OT Goal Formulation: With patient/family Time For Goal Achievement: 03/04/13 Potential to Achieve Goals: Good ADL Goals Pt Will Perform Grooming: with min guard assist;standing Pt Will Transfer to Toilet: with min guard assist;bedside commode Additional ADL Goal #1: Pt will complete bed mobility MIn (A) as precursor to adls  Visit Information  Last OT Received On: 02/18/13 Assistance Needed: +1 PT/OT Co-Evaluation/Treatment: Yes History of Present Illness:  79 y.o. male hx of BPH, GERD comes in with a chief complaint of a ground-level fall at home. States at around 3 AM, he was trying to get to the bathroom, he fell getting out of bed and he called because he couldn't get up. Denies loss of consciousness. pt s/p PERCUTANEOUS PINNING HIP (Left).He is able to walk on his own at baseline. 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       Prior Functioning     Home Living Family/patient expects  to be discharged to:: Skilled nursing facility Prior Function Level of Independence: Independent with  assistive device(s) Communication Communication: No difficulties Dominant Hand: Right         Vision/Perception Vision - Assessment Vision Assessment: Vision not tested   Cognition  Cognition Arousal/Alertness: Awake/alert Behavior During Therapy: WFL for tasks assessed/performed Overall Cognitive Status: Within Functional Limits for tasks assessed    Extremity/Trunk Assessment Upper Extremity Assessment Upper Extremity Assessment: Overall WFL for tasks assessed Lower Extremity Assessment Lower Extremity Assessment: Defer to PT evaluation Cervical / Trunk Assessment Cervical / Trunk Assessment: Normal     Mobility Bed Mobility Bed Mobility: Supine to Sit;Sitting - Scoot to Edge of Bed Supine to Sit: 1: +2 Total assist;HOB elevated Supine to Sit: Patient Percentage: 30% Sitting - Scoot to Edge of Bed: 2: Max assist Details for Bed Mobility Assistance: pt anxious for mobility due to anticipating pain. pt progressed to EOB well. Pt required the use of pad for transfer to EOB . Transfers Transfers: Sit to Stand;Stand to Sit Sit to Stand: 1: +2 Total assist;With upper extremity assist;From bed Sit to Stand: Patient Percentage: 70% Stand to Sit: 1: +2 Total assist;With upper extremity assist;To chair/3-in-1 Stand to Sit: Patient Percentage: 70% Details for Transfer Assistance: cues for hand placement and safety     Exercise     Balance Balance Balance Assessed: Yes Static Standing Balance Static Standing - Balance Support: Bilateral upper extremity supported Static Standing - Level of Assistance: 5: Stand by assistance   End of Session OT - End of Session Activity Tolerance: Patient tolerated treatment well Patient left: in chair;with call bell/phone within reach;with family/visitor present Nurse Communication: Mobility status;Precautions  GO     Lucile Shutters 02/18/2013, 1:50 PM Pager: [phone]

## 2013-02-18 NOTE — Progress Notes (Signed)
Utilization review complete. Victorhugo Preis RN CCM Case Mgmt phone [phone] 

## 2013-02-19 DIAGNOSIS — D696 Thrombocytopenia, unspecified: Secondary | ICD-10-CM

## 2013-02-19 LAB — CBC
HCT: 30.6 % — ABNORMAL LOW (ref 39.0–52.0)
Hemoglobin: 10.9 g/dL — ABNORMAL LOW (ref 13.0–17.0)
MCHC: 35.6 g/dL (ref 30.0–36.0)
RBC: 3.34 MIL/uL — ABNORMAL LOW (ref 4.22–5.81)
WBC: 5.1 10*3/uL (ref 4.0–10.5)

## 2013-02-19 LAB — BASIC METABOLIC PANEL
BUN: 21 mg/dL (ref 6–23)
CO2: 23 mEq/L (ref 19–32)
Chloride: 105 mEq/L (ref 96–112)
Glucose, Bld: 105 mg/dL — ABNORMAL HIGH (ref 70–99)
Potassium: 4.3 mEq/L (ref 3.5–5.1)

## 2013-02-19 NOTE — Progress Notes (Signed)
Physical Therapy Treatment Patient Details Name: NEIZAN DEBRUHL MRN: [id number] DOB: [birth year] Today's Date: 02/19/2013 Time: 4098-1191 PT Time Calculation (min): 25 min  PT Assessment / Plan / Recommendation  History of Present Illness  79 y.o. male hx of BPH, GERD comes in with a chief complaint of a ground-level fall at home. States at around 3 AM, he was trying to get to the bathroom, he fell getting out of bed and he called because he couldn't get up. Denies loss of consciousness. pt s/p PERCUTANEOUS PINNING HIP (Left).He is able to walk on his own at baseline. 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   PT Comments   Pt very pleasant & motivated to participate in therapy.  Able to increase ambulation distance today.    Follow Up Recommendations  SNF;Supervision/Assistance - 24 hour     Does the patient have the potential to tolerate intense rehabilitation     Barriers to Discharge        Equipment Recommendations  None recommended by PT    Recommendations for Other Services    Frequency Min 6X/week   Progress towards PT Goals Progress towards PT goals: Progressing toward goals  Plan Current plan remains appropriate    Precautions / Restrictions Precautions Precautions: Fall Restrictions LLE Weight Bearing: Weight bearing as tolerated   Pertinent Vitals/Pain C/o mild pain in Lt hip but never rated & states ambulating decreases discomfort.       Mobility  Bed Mobility Bed Mobility: Supine to Sit;Sitting - Scoot to Edge of Bed Supine to Sit: 1: +2 Total assist;HOB elevated;With rails Supine to Sit: Patient Percentage: 40% Sitting - Scoot to Edge of Bed: 2: Max assist Details for Bed Mobility Assistance: (A) for LLE, to lift shoulders/trunk to sitting upright, & use of draw pad to pivot hips around/scoot closer to EOB.   Transfers Transfers: Sit to Stand;Stand to Sit Sit to Stand: 3: Mod assist;With upper extremity assist;With armrests;From chair/3-in-1;From bed Stand  to Sit: 4: Min assist;With upper extremity assist;With armrests;To chair/3-in-1 Details for Transfer Assistance: cues for hand placement & safety.  Ambulation/Gait Ambulation/Gait Assistance: 4: Min assist Ambulation Distance (Feet): 60 Feet (20' + 40) Assistive device: Rolling walker Ambulation/Gait Assistance Details: cues for sequencing, RW advancement, & to body positioning with RW.  Gait Pattern: Step-through pattern;Decreased stride length;Decreased step length - right;Decreased step length - left (decreased step height) Stairs: No Wheelchair Mobility Wheelchair Mobility: No      PT Goals (current goals can now be found in the care plan section) Acute Rehab PT Goals Patient Stated Goal: to return home with wife PT Goal Formulation: With patient Time For Goal Achievement: 02/25/13 Potential to Achieve Goals: Good  Visit Information  Last PT Received On: 02/19/13 Assistance Needed: +2 (follow with recliner) History of Present Illness:  79 y.o. male hx of BPH, GERD comes in with a chief complaint of a ground-level fall at home. States at around 3 AM, he was trying to get to the bathroom, he fell getting out of bed and he called because he couldn't get up. Denies loss of consciousness. pt s/p PERCUTANEOUS PINNING HIP (Left).He is able to walk on his own at baseline. 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    Subjective Data  Patient Stated Goal: to return home with wife   Cognition  Cognition Arousal/Alertness: Awake/alert Behavior During Therapy: WFL for tasks assessed/performed Overall Cognitive Status: Within Functional Limits for tasks assessed    Balance     End  of Session PT - End of Session Equipment Utilized During Treatment: Gait belt Activity Tolerance: Patient tolerated treatment well Patient left: in chair;with call bell/phone within reach Nurse Communication: Mobility status   GP     Lara Mulch 02/19/2013, 8:58 AM   Verdell Face,  PTA [phone] 02/19/2013

## 2013-02-19 NOTE — Progress Notes (Addendum)
Clinical Social Work Department CLINICAL SOCIAL WORK PLACEMENT NOTE 02/19/2013  Patient:  [name], [name]  Account Number:  [id number] Admit date:  02/17/2013  Clinical Social Worker:  Jetta Lout, Theresia Majors  Date/time:  02/19/2013 05:03 PM  Clinical Social Work is seeking post-discharge placement for this patient at the following level of care:   SKILLED NURSING   (*CSW will update this form in Epic as items are completed)   02/19/2013  Patient/family provided with [facility] Department of Clinical Social Work's list of facilities offering this level of care within the geographic area requested by the patient (or if unable, by the patient's family).  02/19/2013  Patient/family informed of their freedom to choose among providers that offer the needed level of care, that participate in Medicare, Medicaid or managed care program needed by the patient, have an available bed and are willing to accept the patient.  02/19/2013  Patient/family informed of [facility]' ownership interest in [facility], as well as of the fact that they are under no obligation to receive care at this facility.  PASARR submitted to EDS on 02/19/2013 PASARR number received from EDS on 02/19/2013  FL2 transmitted to all facilities in geographic area requested by pt/family on  02/19/2013 FL2 transmitted to all facilities within larger geographic area on   Patient informed that his/her managed care company has contracts with or will negotiate with  certain facilities, including the following:     Patient/family informed of bed offers received:  02/21/2013 Patient chooses bed at [facility] Physician recommends and patient chooses bed at    Patient to be transferred to  on  02/21/2013 Patient to be transferred to facility by [facility]  The following physician request were entered in Epic:   Additional Comments: Patient is agreeable to SNF search in [facility]. Daughter [name] [phone]  reported that  her first choice is [facility].

## 2013-02-19 NOTE — Progress Notes (Signed)
Clinical Social Work Department BRIEF PSYCHOSOCIAL ASSESSMENT 02/19/2013  Patient:  [name], [name]     Account Number:  [id number]     Admit date:  02/17/2013  Clinical Social Worker:  Hendricks Milo  Date/Time:  02/19/2013 01:11 PM  Referred by:  Physician  Date Referred:  02/19/2013 Referred for  SNF Placement   Other Referral:   Interview type:  Patient Other interview type:   Intervied Patient and Daughter of patient [name] via phone [phone]    PSYCHOSOCIAL DATA Living Status:  WIFE Admitted from facility:   Level of care:   Primary support name:  [name] [phone] Primary support relationship to patient:  CHILD, ADULT Degree of support available:   Very supportive.    CURRENT CONCERNS  Other Concerns:    SOCIAL WORK ASSESSMENT / PLAN CSW met with patient who is agreeable to SNF. Patient gave permission to fax out in [facility] and to contact his daughter. CSW contacted daughter and she reported that [name] is there first choice. Daughter reported that patient was the primary caregiver for his wife until recently and the daughter and son are trying to take care for both of their parents.   Assessment/plan status:  Psychosocial Support/Ongoing Assessment of Needs Other assessment/ plan:   Information/referral to community resources:   CSW gave SNF list to patient.    PATIENT'S/FAMILY'S RESPONSE TO PLAN OF CARE: Patient and daughter agreeable to SNF. Daughter reported that [name] is there first choice. Patient had no preferences.

## 2013-02-19 NOTE — Progress Notes (Signed)
Patient ID: [name], male   DOB: 28-Aug-1925, 79 y.o.   MRN: [id number]     Subjective:  Patient reports pain as mild to moderate.  Patient alert and follows commands.  He states that he is just sore in his left thigh.  Objective:   VITALS:   Filed Vitals:   02/18/13 1324 02/18/13 1600 02/18/13 2109 02/19/13 0456  BP: 97/54  94/53 127/85  Pulse:   74 76  Temp: 97.8 F (36.6 C)  97.8 F (36.6 C) 97.5 F (36.4 C)  TempSrc:      Resp: 18 18 16 16   SpO2: 93%  96% 97%    ABD soft Sensation intact distally Dorsiflexion/Plantar flexion intact Incision: moderate drainage Dressing C/D/I   Lab Results  Component Value Date   WBC 5.1 02/19/2013   HGB 10.9* 02/19/2013   HCT 30.6* 02/19/2013   MCV 91.6 02/19/2013   PLT 86* 02/19/2013     Assessment/Plan: 2 Days Post-Op   Principal Problem:   Fracture of femoral neck, left Active Problems:   BPH (benign prostatic hyperplasia)   GERD (gastroesophageal reflux disease)   Hyperlipidemia   Advance diet Up with therapy Plan dressing change tomorrow  Plan for SNF. Continue plan per medicine   [name], D 02/19/2013, 9:39 AM

## 2013-02-19 NOTE — Progress Notes (Signed)
Patient ID: [name]  male  QMV:[id number]    DOB: 1926/05/05    DOA: 02/17/2013  PCP: Leo Grosser, MD  Assessment/Plan: Principal Problem:   Fracture of femoral neck, left Active Problems:   BPH (benign prostatic hyperplasia)   GERD (gastroesophageal reflux disease)   Hyperlipidemia  Left femoral neck fracture postop day 2 - Status post percutaneous pinning hip  - per ortho dressing changes tomorrow, physical therapy -Currently on Lovenox for DVT prophylaxis (defer to ortho for DVT prophylaxis for discharge)   BPH - continue Proscar and Flomax  Thrombocytopenia  - unclear significance, monitor as they have started trending down   DVT Prophylaxis: currently Lovenox, PLT low and trending down, will send HIT Panel. D/W pharmacy, will observe today, if PLT low tomorrow, will change to argatroban.      Code Status:  Disposition:SNF     Subjective: Asian sitting in the chair, denies any complaints, only some soreness in the left hip   Objective: Weight change:   Intake/Output Summary (Last 24 hours) at 02/19/13 1458 Last data filed at 02/19/13 1256  Gross per 24 hour  Intake    240 ml  Output    275 ml  Net    -35 ml   Blood pressure 97/76, pulse 77, temperature 97.5 F (36.4 C), temperature source Oral, resp. rate 18, SpO2 95.00%.  Physical Exam: General: Alert and awake, NAD CVS: S1-S2 clear, no murmur rubs or gallops Chest: CTAB Abdomen: soft NT, ND, NBS Extremities: no cyanosis, clubbing or edema noted bilaterally   Lab Results: Basic Metabolic Panel:  Recent Labs Lab 02/18/13 0545 02/19/13 0600  NA 136 134*  K 4.3 4.3  CL 103 105  CO2 23 23  GLUCOSE 97 105*  BUN 17 21  CREATININE 0.74 0.76  CALCIUM 8.5 8.4   Liver Function Tests: No results found for this basename: AST, ALT, ALKPHOS, BILITOT, PROT, ALBUMIN,  in the last 168 hours No results found for this basename: LIPASE, AMYLASE,  in the last 168 hours No results found for this  basename: AMMONIA,  in the last 168 hours CBC:  Recent Labs Lab 02/17/13 0850  02/18/13 0545 02/19/13 0600  WBC 10.0  < > 7.0 5.1  NEUTROABS 9.6*  --   --   --   HGB 13.7  < > 11.8* 10.9*  HCT 39.7  < > 34.7* 30.6*  MCV 94.5  < > 94.0 91.6  PLT 113*  < > 92* 86*  < > = values in this interval not displayed. Cardiac Enzymes:  Recent Labs Lab 02/17/13 0850  TROPONINI <0.30   BNP: No components found with this basename: POCBNP,  CBG: No results found for this basename: GLUCAP,  in the last 168 hours   Micro Results: No results found for this or any previous visit (from the past 240 hour(s)).  Studies/Results: Dg Chest 1 View  02/17/2013   *RADIOLOGY REPORT*  Clinical Data: Fall  CHEST - 1 VIEW  Comparison: None.  Findings: Cardiomediastinal silhouette appears normal.  No acute pulmonary disease is noted.  Bony thorax is intact.  IMPRESSION: No acute cardiopulmonary abnormality seen.   Original Report Authenticated By: Lupita Raider.,  M.D.   Dg Hip Complete Left  02/17/2013   *RADIOLOGY REPORT*  Clinical Data: Fall, left hip pain  LEFT HIP - COMPLETE 2+ VIEW  Comparison: 11/26/2012 CT exam  Findings:  Degenerative changes noted of both hips.  Bones are osteopenic.  There is a nondisplaced  radiolucent fracture line of the left hip subcapital femoral neck. No associated subluxation or dislocation.  Right pelvic calcifications noted consistent with chronic bladder calculi.  IMPRESSION: Acute nondisplaced left subcapital femoral neck fracture   Original Report Authenticated By: Judie Petit. Shick, M.D.   Dg Hip Operative Left  02/17/2013   *RADIOLOGY REPORT*  Clinical Data: Hip fracture  OPERATIVE LEFT HIP  Comparison: 0800 hours  Findings: Three cancellous screws transfix a left femoral neck fracture.  Anatomic alignment.  No breakage or loosening of the hardware.  IMPRESSION: ORIF left femoral neck fracture.  Anatomic.   Original Report Authenticated By: Jolaine Click, M.D.   Ct Head Wo  Contrast  02/17/2013   *RADIOLOGY REPORT*  Clinical Data: Fall.  CT HEAD WITHOUT CONTRAST  Technique:  Contiguous axial images were obtained from the base of the skull through the vertex without contrast.  Comparison: No comparison head CT.  Comparison sinus CT 04/08/2011.  Findings: Motion degraded exam.  No obvious skull fracture or intracranial hemorrhage.  Small scalp hematoma right parietal region suspected.  Atrophy.  Ventricular prominence felt to represent combination of atrophy and mild hydrocephalus.  Small vessel disease type changes without CT evidence of large acute infarct.  No intracranial mass lesion detected on this unenhanced exam.  IMPRESSION: Motion degraded exam.  No obvious skull fracture or intracranial hemorrhage.  Small scalp hematoma right parietal region suspected.  Atrophy.  Ventricular prominence felt to represent combination of atrophy and mild hydrocephalus.  Small vessel disease type changes without CT evidence of large acute infarct.   Original Report Authenticated By: Lacy Duverney, M.D.   Dg Pelvis Portable  02/17/2013   *RADIOLOGY REPORT*  Clinical Data: Postop left hip pinning  PORTABLE PELVIS  Comparison: Preoperative pelvis and left hip films of 02/17/2013  Findings: Three compression nails are present through the left femoral head and neck for fixation of the left femoral neck fracture.  No complicating features are seen.  The pelvic rami are intact.  There are degenerative changes in both hip joints right greater than left.  Rounded faint calcifications in the pelvis could represent bladder calculi.  IMPRESSION: 1.  Pinning of left femoral neck fracture with no complicating features.  2.  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  3.  Question urinary bladder calculi.   Original Report Authenticated By: Dwyane Dee, M.D.   Dg Hip Portable 1 View Left  02/17/2013   *RADIOLOGY REPORT*  Clinical Data: Left hip fracture, status post fixation  PORTABLE  LEFT HIP - 1 VIEW  Comparison: 02/17/2013  Findings: Cross-table lateral view demonstrates screw fixation of the subcapital femoral neck fracture.  Alignment grossly anatomic.  IMPRESSION: Expected appearance status post left hip fracture screw fixation   Original Report Authenticated By: Judie Petit. Miles Costain, M.D.    Medications: Scheduled Meds: . docusate sodium  100 mg Oral BID  . enoxaparin (LOVENOX) injection  40 mg Subcutaneous Q24H  . finasteride  5 mg Oral Daily  . senna  1 tablet Oral BID  . tamsulosin  0.4 mg Oral Daily      LOS: 2 days   RAI,RIPUDEEP M.D. [facility] 02/19/2013, 2:58 PM Pager: [phone]  If 7PM-7AM, please contact night-coverage [email or web address] Password [email or web address]

## 2013-02-20 LAB — CBC
MCH: 31.4 pg (ref 26.0–34.0)
MCHC: 34 g/dL (ref 30.0–36.0)
MCV: 92.4 fL (ref 78.0–100.0)
Platelets: 104 10*3/uL — ABNORMAL LOW (ref 150–400)
RDW: 13.3 % (ref 11.5–15.5)

## 2013-02-20 MED ORDER — PNEUMOCOCCAL VAC POLYVALENT 25 MCG/0.5ML IJ INJ
0.5000 mL | INJECTION | INTRAMUSCULAR | Status: AC
  Administered 2013-02-21: 0.5 mL via INTRAMUSCULAR
  Filled 2013-02-20: qty 0.5

## 2013-02-20 NOTE — Progress Notes (Signed)
Patient ID: [name], male   DOB: 09-Jan-1926, 79 y.o.   MRN: [id number]     Subjective:  Patient reports pain as mild to moderate.  Patient alert and follows commands.  He states that he is just sore in his left thigh.  Objective:   VITALS:   Filed Vitals:   02/20/13 0000 02/20/13 0359 02/20/13 0632 02/20/13 1304  BP:   135/90 157/81  Pulse:   95 77  Temp:   97.5 F (36.4 C) 97.6 F (36.4 C)  TempSrc:      Resp: 18 18 18 18   Height:   5\' 8"  (1.727 m)   Weight:   70.308 kg (155 lb)   SpO2: 99% 99% 96% 97%    ABD soft Sensation intact distally Dorsiflexion/Plantar flexion intact Incision: moderate drainage Dressing C/D/I   Lab Results  Component Value Date   WBC 5.6 02/20/2013   HGB 11.1* 02/20/2013   HCT 32.6* 02/20/2013   MCV 92.4 02/20/2013   PLT 104* 02/20/2013     Assessment/Plan: 3 Days Post-Op   Principal Problem:   Fracture of femoral neck, left Active Problems:   BPH (benign prostatic hyperplasia)   GERD (gastroesophageal reflux disease)   Hyperlipidemia   Advance diet Up with therapy Plan dressing change tomorrow  Plan for SNF. Continue plan per medicine   [name], D 02/20/2013, 5:56 PM

## 2013-02-20 NOTE — Progress Notes (Signed)
Physical Therapy Treatment Patient Details Name: [name] MRN: [id number] DOB: 27-Apr-1926 Today's Date: 02/20/2013 Time: 1478-2956 PT Time Calculation (min): 19 min  PT Assessment / Plan / Recommendation  History of Present Illness  79 y.o. male hx of BPH, GERD comes in with a chief complaint of a ground-level fall at home. States at around 3 AM, he was trying to get to the bathroom, he fell getting out of bed and he called because he couldn't get up. Denies loss of consciousness. pt s/p PERCUTANEOUS PINNING HIP (Left).He is able to walk on his own at baseline. 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   PT Comments   Making progress with ambulation and activity tolerance; Feel it is worth considering if he qualifies for CIR; Requested a screen  Follow Up Recommendations  SNF;Supervision/Assistance - 24 hour ?CIR   Does the patient have the potential to tolerate intense rehabilitation     Barriers to Discharge        Equipment Recommendations  None recommended by PT    Recommendations for Other Services    Frequency Min 6X/week   Progress towards PT Goals    Plan Current plan remains appropriate    Precautions / Restrictions Precautions Precautions: Fall Restrictions Weight Bearing Restrictions: Yes LLE Weight Bearing: Weight bearing as tolerated   Pertinent Vitals/Pain Did not rate pain; It seemed like bed mobility was most painful activity; once walking, pt seemed more at ease    Mobility  Bed Mobility Bed Mobility: Supine to Sit;Sitting - Scoot to Edge of Bed Supine to Sit: 1: +2 Total assist;HOB elevated;With rails Supine to Sit: Patient Percentage: 70% Sitting - Scoot to Edge of Bed: 3: Mod assist Details for Bed Mobility Assistance: (A) for LLE, to lift shoulders/trunk to sitting upright, & use of draw pad to pivot hips around/scoot closer to EOB.   Transfers Transfers: Sit to Stand;Stand to Sit Sit to Stand: 3: Mod assist;With upper extremity assist;With  armrests;From chair/3-in-1;From bed Stand to Sit: 4: Min assist;With upper extremity assist;With armrests;To chair/3-in-1 Details for Transfer Assistance: cues for hand placement & safety.  Ambulation/Gait Ambulation/Gait Assistance: 4: Min assist (second person present to push chair behind) Ambulation Distance (Feet): 60 Feet Assistive device: Rolling walker Ambulation/Gait Assistance Details: Cues for gait sequence and posture Gait Pattern: Step-through pattern;Decreased stride length;Decreased step length - right;Decreased step length - left (decreased step height) Stairs: No Wheelchair Mobility Wheelchair Mobility: No    Exercises     PT Diagnosis:    PT Problem List:   PT Treatment Interventions:     PT Goals (current goals can now be found in the care plan section) Acute Rehab PT Goals Patient Stated Goal: to return home with wife PT Goal Formulation: With patient Time For Goal Achievement: 02/25/13 Potential to Achieve Goals: Good  Visit Information  Last PT Received On: 02/20/13 Assistance Needed: +2 (+1 soon, 2nd person to push recliner) History of Present Illness:  79 y.o. male hx of BPH, GERD comes in with a chief complaint of a ground-level fall at home. States at around 3 AM, he was trying to get to the bathroom, he fell getting out of bed and he called because he couldn't get up. Denies loss of consciousness. pt s/p PERCUTANEOUS PINNING HIP (Left).He is able to walk on his own at baseline. 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    Subjective Data  Subjective: Seemed pleased with his walking today Patient Stated Goal: to return home with wife  Cognition  Cognition Arousal/Alertness: Awake/alert Behavior During Therapy: WFL for tasks assessed/performed Overall Cognitive Status: Within Functional Limits for tasks assessed    Balance     End of Session PT - End of Session Equipment Utilized During Treatment: Gait belt Activity Tolerance: Patient tolerated  treatment well Patient left: in chair;with call bell/phone within reach Nurse Communication: Mobility status   GP     Olen Pel [place], [place] [phone]  02/20/2013, 4:40 PM

## 2013-02-20 NOTE — Progress Notes (Signed)
Patient ID: [name]  male  WUJ:[id number]    DOB: Jan 08, 1926    DOA: 02/17/2013  PCP: Leo Grosser, MD  Assessment/Plan: Principal Problem:   Fracture of femoral neck, left Active Problems:   BPH (benign prostatic hyperplasia)   GERD (gastroesophageal reflux disease)   Hyperlipidemia  Left femoral neck fracture postop day 3 - Status post percutaneous pinning hip  - per ortho dressing changes tomorrow, physical therapy -Currently on Lovenox for DVT prophylaxis (defer to ortho for DVT prophylaxis for discharge)   BPH - continue Proscar and Flomax  Thrombocytopenia  - unclear significance, PLT now at baseline   DVT Prophylaxis: currently Lovenox  Code Status:  Disposition:SNF     Subjective: Patient lying in the bed, still complaining of left hip pain  Objective: Weight change:   Intake/Output Summary (Last 24 hours) at 02/20/13 1355 Last data filed at 02/20/13 1221  Gross per 24 hour  Intake    840 ml  Output   1175 ml  Net   -335 ml   Blood pressure 157/81, pulse 77, temperature 97.6 F (36.4 C), temperature source Oral, resp. rate 18, height 5\' 8"  (1.727 m), weight 70.308 kg (155 lb), SpO2 97.00%.  Physical Exam: General: Alert and awake, NAD CVS: S1-S2 clear, no murmur rubs or gallops Chest: CTAB Abdomen: soft NT, ND, NBS Extremities: no c/c/e bilaterally, left hip dressings intact   Lab Results: Basic Metabolic Panel:  Recent Labs Lab 02/18/13 0545 02/19/13 0600  NA 136 134*  K 4.3 4.3  CL 103 105  CO2 23 23  GLUCOSE 97 105*  BUN 17 21  CREATININE 0.74 0.76  CALCIUM 8.5 8.4   Liver Function Tests: No results found for this basename: AST, ALT, ALKPHOS, BILITOT, PROT, ALBUMIN,  in the last 168 hours No results found for this basename: LIPASE, AMYLASE,  in the last 168 hours No results found for this basename: AMMONIA,  in the last 168 hours CBC:  Recent Labs Lab 02/17/13 0850  02/19/13 0600 02/20/13 0555  WBC 10.0  < > 5.1 5.6   NEUTROABS 9.6*  --   --   --   HGB 13.7  < > 10.9* 11.1*  HCT 39.7  < > 30.6* 32.6*  MCV 94.5  < > 91.6 92.4  PLT 113*  < > 86* 104*  < > = values in this interval not displayed. Cardiac Enzymes:  Recent Labs Lab 02/17/13 0850  TROPONINI <0.30   BNP: No components found with this basename: POCBNP,  CBG: No results found for this basename: GLUCAP,  in the last 168 hours   Micro Results: No results found for this or any previous visit (from the past 240 hour(s)).  Studies/Results: Dg Chest 1 View  02/17/2013   *RADIOLOGY REPORT*  Clinical Data: Fall  CHEST - 1 VIEW  Comparison: None.  Findings: Cardiomediastinal silhouette appears normal.  No acute pulmonary disease is noted.  Bony thorax is intact.  IMPRESSION: No acute cardiopulmonary abnormality seen.   Original Report Authenticated By: Lupita Raider.,  M.D.   Dg Hip Complete Left  02/17/2013   *RADIOLOGY REPORT*  Clinical Data: Fall, left hip pain  LEFT HIP - COMPLETE 2+ VIEW  Comparison: 11/26/2012 CT exam  Findings:  Degenerative changes noted of both hips.  Bones are osteopenic.  There is a nondisplaced radiolucent fracture line of the left hip subcapital femoral neck. No associated subluxation or dislocation.  Right pelvic calcifications noted consistent with chronic bladder calculi.  IMPRESSION: Acute nondisplaced left subcapital femoral neck fracture   Original Report Authenticated By: Judie Petit. Shick, M.D.   Dg Hip Operative Left  02/17/2013   *RADIOLOGY REPORT*  Clinical Data: Hip fracture  OPERATIVE LEFT HIP  Comparison: 0800 hours  Findings: Three cancellous screws transfix a left femoral neck fracture.  Anatomic alignment.  No breakage or loosening of the hardware.  IMPRESSION: ORIF left femoral neck fracture.  Anatomic.   Original Report Authenticated By: Jolaine Click, M.D.   Ct Head Wo Contrast  02/17/2013   *RADIOLOGY REPORT*  Clinical Data: Fall.  CT HEAD WITHOUT CONTRAST  Technique:  Contiguous axial images were obtained  from the base of the skull through the vertex without contrast.  Comparison: No comparison head CT.  Comparison sinus CT 04/08/2011.  Findings: Motion degraded exam.  No obvious skull fracture or intracranial hemorrhage.  Small scalp hematoma right parietal region suspected.  Atrophy.  Ventricular prominence felt to represent combination of atrophy and mild hydrocephalus.  Small vessel disease type changes without CT evidence of large acute infarct.  No intracranial mass lesion detected on this unenhanced exam.  IMPRESSION: Motion degraded exam.  No obvious skull fracture or intracranial hemorrhage.  Small scalp hematoma right parietal region suspected.  Atrophy.  Ventricular prominence felt to represent combination of atrophy and mild hydrocephalus.  Small vessel disease type changes without CT evidence of large acute infarct.   Original Report Authenticated By: Lacy Duverney, M.D.   Dg Pelvis Portable  02/17/2013   *RADIOLOGY REPORT*  Clinical Data: Postop left hip pinning  PORTABLE PELVIS  Comparison: Preoperative pelvis and left hip films of 02/17/2013  Findings: Three compression nails are present through the left femoral head and neck for fixation of the left femoral neck fracture.  No complicating features are seen.  The pelvic rami are intact.  There are degenerative changes in both hip joints right greater than left.  Rounded faint calcifications in the pelvis could represent bladder calculi.  IMPRESSION: 1.  Pinning of left femoral neck fracture with no complicating features.  2.  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  3.  Question urinary bladder calculi.   Original Report Authenticated By: Dwyane Dee, M.D.   Dg Hip Portable 1 View Left  02/17/2013   *RADIOLOGY REPORT*  Clinical Data: Left hip fracture, status post fixation  PORTABLE LEFT HIP - 1 VIEW  Comparison: 02/17/2013  Findings: Cross-table lateral view demonstrates screw fixation of the subcapital femoral neck  fracture.  Alignment grossly anatomic.  IMPRESSION: Expected appearance status post left hip fracture screw fixation   Original Report Authenticated By: Judie Petit. Miles Costain, M.D.    Medications: Scheduled Meds: . docusate sodium  100 mg Oral BID  . enoxaparin (LOVENOX) injection  40 mg Subcutaneous Q24H  . finasteride  5 mg Oral Daily  . [START ON 02/21/2013] pneumococcal 23 valent vaccine  0.5 mL Intramuscular Tomorrow-1000  . senna  1 tablet Oral BID  . tamsulosin  0.4 mg Oral Daily      LOS: 3 days   RAI,RIPUDEEP M.D. [facility] 02/20/2013, 1:55 PM Pager: [phone]  If 7PM-7AM, please contact night-coverage [email or web address] Password [email or web address]

## 2013-02-21 MED ORDER — ENOXAPARIN SODIUM 40 MG/0.4ML ~~LOC~~ SOLN: 40.0000 mg | SUBCUTANEOUS | Status: DC

## 2013-02-21 MED ORDER — BISACODYL 10 MG RE SUPP: 10.0000 mg | Freq: Every day | RECTAL | Status: DC | PRN

## 2013-02-21 MED ORDER — ACETAMINOPHEN 325 MG PO TABS: 650.0000 mg | ORAL_TABLET | Freq: Four times a day (QID) | ORAL | Status: DC | PRN

## 2013-02-21 NOTE — Progress Notes (Signed)
Clinical social worker assisted with patient discharge to skilled nursing facility,[facility] .  CSW addressed all family questions and concerns. CSW copied chart and added all important documents. CSW also set up patient transportation with [facility]. Clinical Social Worker will sign off for now as social work intervention is no longer needed.   Lestat Golob, MSW,  [phone] 

## 2013-02-21 NOTE — Progress Notes (Signed)
Patient ID: [name], male   DOB: 09-13-1925, 79 y.o.   MRN: [id number]     Subjective:  Patient reports pain as mild.  Patient denies any CP or SOB and states that he is doing much better.  Objective:   VITALS:   Filed Vitals:   02/20/13 1304 02/20/13 2000 02/20/13 2100 02/21/13 0618  BP: 157/81  146/87 144/54  Pulse: 77  83 70  Temp: 97.6 F (36.4 C)  97.5 F (36.4 C) 98.5 F (36.9 C)  TempSrc:      Resp: 18 18 18 16   Height:      Weight:      SpO2: 97% 99% 99% 95%    ABD soft Sensation intact distally Dorsiflexion/Plantar flexion intact Incision: dressing C/D/I and no drainage   Lab Results  Component Value Date   WBC 5.6 02/20/2013   HGB 11.1* 02/20/2013   HCT 32.6* 02/20/2013   MCV 92.4 02/20/2013   PLT 104* 02/20/2013     Assessment/Plan: 4 Days Post-Op   Principal Problem:   Fracture of femoral neck, left Active Problems:   BPH (benign prostatic hyperplasia)   GERD (gastroesophageal reflux disease)   Hyperlipidemia   Advance diet Up with therapy WBAT Dry dressing PRN Continue plan per medicine Okay for SNF when cleared by medicine   Torrie Mayers, [place] 02/21/2013, 7:29 AM   Teryl Lucy, MD Cell [phone]

## 2013-02-21 NOTE — Progress Notes (Signed)
Occupational Therapy Treatment Patient Details Name: [name] MRN: [id number] DOB: [birth year] Today's Date: 02/21/2013 Time: 4098-1191 OT Time Calculation (min): 25 min  OT Assessment / Plan / Recommendation       OT comments  Pt. In b.room upon arrival.  Required mod/max a for back peri care following a BM for thoroughness, mod a for lb dressing ie: donning socks secondary to large painful swelling 3rd right toe.  Able to amb. To/from b.room with r.w. With cues for walker safety  Follow Up Recommendations  SNF           Equipment Recommendations  3 in 1 bedside comode        Frequency Min 2X/week   Progress towards OT Goals Progress towards OT goals: Progressing toward goals  Plan Discharge plan remains appropriate    Precautions / Restrictions Precautions Precautions: Fall Restrictions LLE Weight Bearing: Weight bearing as tolerated   Pertinent Vitals/Pain Did not rate but did indicate that his toe was really bothering him, r.n. notified    ADL  Grooming: Performed;Wash/dry hands;Supervision/safety Where Assessed - Grooming: Unsupported standing Lower Body Bathing: Performed;Min guard Where Assessed - Lower Body Bathing: Supported sitting Upper Body Dressing: Performed;Minimal assistance Where Assessed - Upper Body Dressing: Supported sitting Lower Body Dressing: Performed;Moderate assistance Where Assessed - Lower Body Dressing: Supported sitting Toilet Transfer: Performed;Minimal assistance Statistician Method: Sit to stand Toilet Transfer Equipment: Grab bars;Comfort height toilet Toileting - Clothing Manipulation and Hygiene: Performed;Maximal assistance Where Assessed - Engineer, mining and Hygiene: Standing Transfers/Ambulation Related to ADLs: amb. to/from b.room, also in hall min guard a with cues for walker safety  ADL Comments: lb dressing mod a secondary to increased c/o toe pain due to large swelling/bunion, assitance to pull sock  over sore area.  mod a for back peri care following a bm for thoroughness       OT Goals(current goals can now be found in the care plan section)    Visit Information  Last OT Received On: 02/21/13                 Cognition  Cognition Arousal/Alertness: Awake/alert Behavior During Therapy: [facility] for tasks assessed/performed Overall Cognitive Status: Within Functional Limits for tasks assessed    Mobility  Transfers Transfers: Sit to Stand;Stand to Sit Sit to Stand: 4: Min assist;With upper extremity assist;From toilet Stand to Sit: 4: Min guard;With armrests;With upper extremity assist;To chair/3-in-1 Details for Transfer Assistance: cues for hand placement and safety               nd of Session OT - End of Session Equipment Utilized During Treatment: Rolling walker Activity Tolerance: Patient tolerated treatment well Patient left: in chair;with call bell/phone within reach;with family/visitor present       Robet Leu, COTA/L 02/21/2013, 12:11 PM

## 2013-02-21 NOTE — Progress Notes (Signed)
Rehab Admissions Coordinator Note:  Patient was screened by Clois Dupes for appropriateness for an Inpatient Acute Rehab Consult.  [employer] medicare will not approve admission for this diagnosis.  At this time, we are recommending SNF placement.  Clois Dupes 02/21/2013, 9:05 AM  I can be reached at [phone].

## 2013-02-21 NOTE — Discharge Summary (Signed)
Physician Discharge Summary  Patient ID: [name] MRN: [id number] DOB/AGE: 09/11/1925 79 y.o.  Admit date: 02/17/2013 Discharge date: 02/21/2013  Primary Care Physician:  Leo Grosser, MD  Discharge Diagnoses:    Marland Kitchen GERD (gastroesophageal reflux disease) . BPH (benign prostatic hyperplasia) . Hyperlipidemia Left femoral neck fracture, status post percutaneous pinning on 02/17/2013   Consults: Orthopedics, Dr. Dion Saucier   Recommendations for Outpatient Follow-up:  Up with therapy  WBAT  Dry dressing PRN Lovenox for DVT prophylaxis, 40 mg SQ daily for 3 weeks  DIET: Dysphagia 3, mechanical soft diet, thin liquids  Allergies:  No Known Allergies   Discharge Medications:   Medication List         acetaminophen 325 MG tablet  Commonly known as:  TYLENOL  Take 2 tablets (650 mg total) by mouth every 6 (six) hours as needed.     bisacodyl 10 MG suppository  Commonly known as:  DULCOLAX  Place 1 suppository (10 mg total) rectally daily as needed.     enoxaparin 40 MG/0.4ML injection  Commonly known as:  LOVENOX  Inject 0.4 mL (40 mg total) into the skin daily. X 3 weeks     finasteride 5 MG tablet  Commonly known as:  PROSCAR  Take 5 mg by mouth daily.     HYDROcodone-acetaminophen 5-325 MG per tablet  Commonly known as:  NORCO  Take 1-2 tablets by mouth every 6 (six) hours as needed for pain. MAXIMUM TOTAL ACETAMINOPHEN DOSE IS 4000 MG PER DAY     sennosides-docusate sodium 8.6-50 MG tablet  Commonly known as:  SENOKOT-S  Take 2 tablets by mouth daily.     tamsulosin 0.4 MG Caps capsule  Commonly known as:  FLOMAX  Take 0.4 mg by mouth daily.         Brief H and P: For complete details please refer to admission H and P, but in brief [name] is a 79 y.o. male has a past medical history significant for BPH, GERD presented with chief complaint of a ground-level fall at home. Per patient, at around 3 AM, he was trying to get to the bathroom, he  fell out of bed and he called this time as he couldn't get up. He denied any loss of consciousness. He denied any chest pain, nausea vomiting or diarrhea. He denied shortness of breath. Other the left hip pain, he did not have any pain anywhere else.    Hospital Course:  Patient initially presented to [facility] and underwent complete hip x-ray which showed an acute nondisplaced left subcapital femoral neck fracture. Orthopedics was consulted and patient was transferred to [facility] for the surgery. Left femoral neck fracture - patient underwent left femoral neck percutaneous pinning of the hip on 02/17/2013. Patient was closely followed by orthopedics and recommended physical therapy with instructions as above. The patient has been tolerating the pain well. Continue Lovenox for 3 weeks for DVT prophylaxis. Physical therapy recommended skilled nursing facility for continuing rehabilitation. He will benefit from skilled OT as well, 3in1.  BPH - continue Proscar and Flomax   Thrombocytopenia - unclear significance, PLT now at baseline      Day of Discharge BP 144/54  Pulse 70  Temp(Src) 98.5 F (36.9 C) (Oral)  Resp 18  Ht 5\' 8"  (1.727 m)  Wt 70.308 kg (155 lb)  BMI 23.57 kg/m2  SpO2 96%  Physical Exam: General: Alert and awake oriented x3 not in any acute distress. CVS: S1-S2  clear no murmur rubs or gallops Chest: clear to auscultation bilaterally, no wheezing rales or rhonchi Abdomen: soft nontender, nondistended, normal bowel sounds Extremities: no cyanosis, clubbing or edema noted bilaterally    The results of significant diagnostics from this hospitalization (including imaging, microbiology, ancillary and laboratory) are listed below for reference.    LAB RESULTS: Basic Metabolic Panel:  Recent Labs Lab 02/18/13 0545 02/19/13 0600  NA 136 134*  K 4.3 4.3  CL 103 105  CO2 23 23  GLUCOSE 97 105*  BUN 17 21  CREATININE 0.74 0.76  CALCIUM 8.5 8.4    CBC:  Recent Labs Lab 02/17/13 0850  02/19/13 0600 02/20/13 0555  WBC 10.0  < > 5.1 5.6  NEUTROABS 9.6*  --   --   --   HGB 13.7  < > 10.9* 11.1*  HCT 39.7  < > 30.6* 32.6*  MCV 94.5  < > 91.6 92.4  PLT 113*  < > 86* 104*  < > = values in this interval not displayed. Cardiac Enzymes:  Recent Labs Lab 02/17/13 0850  TROPONINI <0.30    Significant Diagnostic Studies:  Dg Chest 1 View  02/17/2013   *RADIOLOGY REPORT*  Clinical Data: Fall  CHEST - 1 VIEW  Comparison: None.  Findings: Cardiomediastinal silhouette appears normal.  No acute pulmonary disease is noted.  Bony thorax is intact.  IMPRESSION: No acute cardiopulmonary abnormality seen.   Original Report Authenticated By: Lupita Raider.,  M.D.   Dg Hip Complete Left  02/17/2013   *RADIOLOGY REPORT*  Clinical Data: Fall, left hip pain  LEFT HIP - COMPLETE 2+ VIEW  Comparison: 11/26/2012 CT exam  Findings:  Degenerative changes noted of both hips.  Bones are osteopenic.  There is a nondisplaced radiolucent fracture line of the left hip subcapital femoral neck. No associated subluxation or dislocation.  Right pelvic calcifications noted consistent with chronic bladder calculi.  IMPRESSION: Acute nondisplaced left subcapital femoral neck fracture   Original Report Authenticated By: Judie Petit. Shick, M.D.   Dg Hip Operative Left  02/17/2013   *RADIOLOGY REPORT*  Clinical Data: Hip fracture  OPERATIVE LEFT HIP  Comparison: 0800 hours  Findings: Three cancellous screws transfix a left femoral neck fracture.  Anatomic alignment.  No breakage or loosening of the hardware.  IMPRESSION: ORIF left femoral neck fracture.  Anatomic.   Original Report Authenticated By: Jolaine Click, M.D.   Ct Head Wo Contrast  02/17/2013   *RADIOLOGY REPORT*  Clinical Data: Fall.  CT HEAD WITHOUT CONTRAST  Technique:  Contiguous axial images were obtained from the base of the skull through the vertex without contrast.  Comparison: No comparison head CT.  Comparison  sinus CT 04/08/2011.  Findings: Motion degraded exam.  No obvious skull fracture or intracranial hemorrhage.  Small scalp hematoma right parietal region suspected.  Atrophy.  Ventricular prominence felt to represent combination of atrophy and mild hydrocephalus.  Small vessel disease type changes without CT evidence of large acute infarct.  No intracranial mass lesion detected on this unenhanced exam.  IMPRESSION: Motion degraded exam.  No obvious skull fracture or intracranial hemorrhage.  Small scalp hematoma right parietal region suspected.  Atrophy.  Ventricular prominence felt to represent combination of atrophy and mild hydrocephalus.  Small vessel disease type changes without CT evidence of large acute infarct.   Original Report Authenticated By: Lacy Duverney, M.D.   Dg Pelvis Portable  02/17/2013   *RADIOLOGY REPORT*  Clinical Data: Postop left hip pinning  PORTABLE PELVIS  Comparison: Preoperative pelvis and left hip films of 02/17/2013  Findings: Three compression nails are present through the left femoral head and neck for fixation of the left femoral neck fracture.  No complicating features are seen.  The pelvic rami are intact.  There are degenerative changes in both hip joints right greater than left.  Rounded faint calcifications in the pelvis could represent bladder calculi.  IMPRESSION: 1.  Pinning of left femoral neck fracture with no complicating features.  2.  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  3.  Question urinary bladder calculi.   Original Report Authenticated By: Dwyane Dee, M.D.   Dg Hip Portable 1 View Left  02/17/2013   *RADIOLOGY REPORT*  Clinical Data: Left hip fracture, status post fixation  PORTABLE LEFT HIP - 1 VIEW  Comparison: 02/17/2013  Findings: Cross-table lateral view demonstrates screw fixation of the subcapital femoral neck fracture.  Alignment grossly anatomic.  IMPRESSION: Expected appearance status post left hip fracture screw fixation    Original Report Authenticated By: Judie Petit. Miles Costain, M.D.    Disposition and Follow-up:     Discharge Orders   Future Orders Complete By Expires   Diet - low sodium heart healthy  As directed    Discharge instructions  As directed    Comments:     Discharge Diet: Dysphagia 3 (mechanical soft), thin liquids   Increase activity slowly  As directed    Weight bearing as tolerated  As directed        DISPOSITION: SNF  DIET: Dysphagia 3, mechanical soft diet, thin liquids  ACTIVITY: As tolerated   DISCHARGE FOLLOW-UP Follow-up Information   Follow up with Eulas Post, MD. Schedule an appointment as soon as possible for a visit in 2 weeks.   Specialty:  Orthopedic Surgery   Contact information:   [address] [place] [address] [phone]       Follow up with [facility] TOM, MD. Schedule an appointment as soon as possible for a visit in 2 weeks.   Specialty:  Family Medicine   Contact information:   [address] [place] [address] [phone]       Time spent on Discharge: 74 MINS  Signed:   RAI,RIPUDEEP M.D. [facility] 02/21/2013, 11:21 AM Pager: [phone]

## 2013-02-21 NOTE — Progress Notes (Signed)
Physical Therapy Treatment Patient Details Name: [name] MRN: [id number] DOB: [birth year] Today's Date: 02/21/2013 Time: 1131-1200 PT Time Calculation (min): 29 min  PT Assessment / Plan / Recommendation  History of Present Illness  79 y.o. male hx of BPH, GERD comes in with a chief complaint of a ground-level fall at home. States at around 3 AM, he was trying to get to the bathroom, he fell getting out of bed and he called because he couldn't get up. Denies loss of consciousness. pt s/p PERCUTANEOUS PINNING HIP (Left).He is able to walk on his own at baseline. 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   PT Comments   Making progress with activity tol and functional mobility, though apparently more painful today Planning for SNF for postacute rehab  Follow Up Recommendations  SNF;Supervision/Assistance - 24 hour     Does the patient have the potential to tolerate intense rehabilitation     Barriers to Discharge        Equipment Recommendations  None recommended by PT    Recommendations for Other Services    Frequency Min 6X/week   Progress towards PT Goals    Plan Current plan remains appropriate    Precautions / Restrictions Precautions Precautions: Fall Restrictions Weight Bearing Restrictions: Yes LLE Weight Bearing: Weight bearing as tolerated   Pertinent Vitals/Pain Did not rate; Very antalgic gait patient repositioned for comfort     Mobility  Bed Mobility Details for Bed Mobility Assistance: Pt in chair upon arrival Transfers Transfers: Sit to Stand;Stand to Sit Sit to Stand: 3: Mod assist;With upper extremity assist;With armrests;From chair/3-in-1 Stand to Sit: With upper extremity assist;With armrests;To chair/3-in-1;3: Mod assist;4: Min assist (mod a to low commode; min A to recliner) Details for Transfer Assistance: cues for hand placement & safety. Physical assist for descent to commode Ambulation/Gait Ambulation/Gait Assistance: 4: Min assist (second  person present to push chair behind) Ambulation Distance (Feet): 60 Feet (after spending time getting to/from bathroom, and standing i) Assistive device: Rolling walker Ambulation/Gait Assistance Details: Mostly needing cues for posture; seemed to be in more pain today, with smaller steps and less time in L stance Gait Pattern: Decreased stride length;Decreased step length - right;Decreased step length - left;Step-to pattern (decreased step height) Stairs: No Wheelchair Mobility Wheelchair Mobility: No    Exercises     PT Diagnosis:    PT Problem List:   PT Treatment Interventions:     PT Goals (current goals can now be found in the care plan section) Acute Rehab PT Goals Patient Stated Goal: to return home with wife PT Goal Formulation: With patient Time For Goal Achievement: 02/25/13 Potential to Achieve Goals: Good  Visit Information  Last PT Received On: 02/21/13 Assistance Needed: +1 PT/OT Co-Evaluation/Treatment: Yes History of Present Illness:  79 y.o. male hx of BPH, GERD comes in with a chief complaint of a ground-level fall at home. States at around 3 AM, he was trying to get to the bathroom, he fell getting out of bed and he called because he couldn't get up. Denies loss of consciousness. pt s/p PERCUTANEOUS PINNING HIP (Left).He is able to walk on his own at baseline. 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    Subjective Data  Subjective: Seemed pleased with his walking today; a bit preoccupied by pain/swelling R 3rd toe (difficult to understand his speech) Patient Stated Goal: to return home with wife   Cognition  Cognition Arousal/Alertness: Awake/alert Behavior During Therapy: WFL for tasks assessed/performed Overall  Cognitive Status: Within Functional Limits for tasks assessed    Balance  Static Standing Balance Static Standing - Balance Support: Bilateral upper extremity supported Static Standing - Level of Assistance: 5: Stand by assistance  End of  Session PT - End of Session Equipment Utilized During Treatment: Gait belt Activity Tolerance: Patient tolerated treatment well Patient left: in chair;with call bell/phone within reach Nurse Communication: Mobility status   GP     [facility] [place], [place]unice [phone]  02/21/2013, 1:19 PM

## 2013-02-22 ENCOUNTER — Non-Acute Institutional Stay (SKILLED_NURSING_FACILITY): Payer: Medicare Other | Admitting: Nurse Practitioner

## 2013-02-22 ENCOUNTER — Other Ambulatory Visit: Payer: Self-pay | Admitting: Geriatric Medicine

## 2013-02-22 DIAGNOSIS — D696 Thrombocytopenia, unspecified: Secondary | ICD-10-CM

## 2013-02-22 DIAGNOSIS — S72009S Fracture of unspecified part of neck of unspecified femur, sequela: Secondary | ICD-10-CM

## 2013-02-22 DIAGNOSIS — N400 Enlarged prostate without lower urinary tract symptoms: Secondary | ICD-10-CM

## 2013-02-22 DIAGNOSIS — S72002S Fracture of unspecified part of neck of left femur, sequela: Secondary | ICD-10-CM

## 2013-02-22 DIAGNOSIS — R5381 Other malaise: Secondary | ICD-10-CM

## 2013-02-22 MED ORDER — HYDROCODONE-ACETAMINOPHEN 5-325 MG PO TABS: 1.0000 | ORAL_TABLET | Freq: Four times a day (QID) | ORAL | Status: DC | PRN

## 2013-02-22 NOTE — Progress Notes (Signed)
Patient ID: [name], male   DOB: 02-10-1926, 79 y.o.   MRN: [id number]   PCP: Leo Grosser, MD  Code Status: FULL   No Known Allergies  Chief Complaint  Patient presents with  . Hospitalization Follow-up    HPI:  [name] is a 79 y.o. male has a past medical history significant for BPH, GERD went to the hospital after a fall with hip pain. He denied any loss of consciousness. He denied any chest pain, nausea vomiting or diarrhea. He denied shortness of breath. Other the left hip pain, he did not have any pain anywhere else. Pt had a complete hip x-ray which showed an acute nondisplaced left subcapital femoral neck fracture. Orthopedics was consulted and patient underwent left femoral neck percutaneous pinning of the hip on 02/17/2013. Patient was closely followed by orthopedics and recommended physical therapy which recommended skilled nursing facility for continuing rehabilitation. Therefore he was transferred to [facility] for ongoing rehab. The patient has been tolerating the pain well and has adequate pain control. He is to continue Lovenox for 3 weeks for DVT prophylaxis.   Review of Systems:  DATA OBTAINED: from patient, nurse, medical record GENERAL: Feels well no fevers, fatigue, appetite changes SKIN: No itching, rash or wounds EYES: No eye pain, redness, discharge EARS: No earache, tinnitus, change in hearing NOSE: No congestion, drainage or bleeding  MOUTH/THROAT: No mouth or tooth pain, No sore throat, No difficulty chewing or swallowing  RESPIRATORY: No cough, wheezing, SOB CARDIAC: No chest pain, palpitations, lower extremity edema  GI: No abdominal pain, No N/V/D or constipation, No heartburn or reflux  GU: No dysuria, frequency or urgency, or incontinence  MUSCULOSKELETAL: No unrelieved bone/joint pain. NEUROLOGIC: Awake, alert, appropriate to situation, No change in mental status. Moves all four, no focal deficits PSYCHIATRIC: No overt anxiety or  sadness. Sleeps well. No behavior issue.     Past Medical History  Diagnosis Date  . BPH (benign prostatic hyperplasia)   . GERD (gastroesophageal reflux disease)   . Hyperlipidemia   . Fracture of femoral neck, left 02/17/2013   Past Surgical History  Procedure Laterality Date  . Colon surgery    . Percutaneous pinning Left 02/17/2013    Procedure: PERCUTANEOUS PINNING HIP;  Surgeon: Eulas Post, MD;  Location: [facility] OR;  Service: Orthopedics;  Laterality: Left;   Social History:   reports that he has never smoked. He does not have any smokeless tobacco history on file. He reports that he does not drink alcohol or use illicit drugs.  No family history on file.  Medications: Patient's Medications  New Prescriptions   No medications on file  Previous Medications   ACETAMINOPHEN (TYLENOL) 325 MG TABLET    Take 2 tablets (650 mg total) by mouth every 6 (six) hours as needed.   BISACODYL (DULCOLAX) 10 MG SUPPOSITORY    Place 1 suppository (10 mg total) rectally daily as needed.   ENOXAPARIN (LOVENOX) 40 MG/0.4ML INJECTION    Inject 0.4 mL (40 mg total) into the skin daily. X 3 weeks   FINASTERIDE (PROSCAR) 5 MG TABLET    Take 5 mg by mouth daily.   HYDROCODONE-ACETAMINOPHEN (NORCO) 5-325 MG PER TABLET    Take 1-2 tablets by mouth every 6 (six) hours as needed for pain. MAXIMUM TOTAL ACETAMINOPHEN DOSE IS 4000 MG PER DAY   SENNOSIDES-DOCUSATE SODIUM (SENOKOT-S) 8.6-50 MG TABLET    Take 2 tablets by mouth daily.   TAMSULOSIN (FLOMAX) 0.4 MG CAPS  Take 0.4 mg by mouth daily.  Modified Medications   No medications on file  Discontinued Medications   No medications on file     Physical Exam:  Filed Vitals:   02/22/13 1658  BP: 150/70  Pulse: 72  Temp: 97.8 F (36.6 C)  Resp: 20    GENERAL APPEARANCE: Alert, conversant. Appropriately groomed. No acute distress.  SKIN: scar on l hip well- healed HEAD: Normocephalic, atraumatic  EYES: Conjunctiva/lids clear. Pupils round,  reactive. EOMs intact.  EARS: External exam WNL, Hearing grossly normal.  NOSE: No deformity or discharge.  MOUTH/THROAT: Lips w/o lesions. Mouth and throat normal. Tongue moist, w/o lesion.  NECK: No thyroid tenderness, enlargement or nodule  RESPIRATORY: Breathing is even, unlabored. Lung sounds are clear   CARDIOVASCULAR: Heart RRR no murmurs, rubs or gallops. No peripheral edema.  ARTERIAL: radial pulse 2+, DP pulse 1+   GASTROINTESTINAL: Abdomen is soft, non-tender, not distended w/ normal bowel sounds. MUSCULOSKELETAL: No abnormal joints or musculature NEUROLOGIC: Oriented X3. Cranial nerves 2-12 grossly intact. Moves all extremities no tremor. PSYCHIATRIC: Mood and affect appropriate to situation, no behavioral issues   Labs reviewed: Basic Metabolic Panel:  Recent Labs  78/29/56 0850 02/17/13 1745 02/18/13 0545 02/19/13 0600  NA 135  --  136 134*  K 3.7  --  4.3 4.3  CL 102  --  103 105  CO2 26  --  23 23  GLUCOSE 109*  --  97 105*  BUN 18  --  17 21  CREATININE 0.87 0.84 0.74 0.76  CALCIUM 9.4  --  8.5 8.4   Liver Function Tests: No results found for this basename: AST, ALT, ALKPHOS, BILITOT, PROT, ALBUMIN,  in the last 8760 hours No results found for this basename: LIPASE, AMYLASE,  in the last 8760 hours No results found for this basename: AMMONIA,  in the last 8760 hours CBC:  Recent Labs  02/17/13 0850  02/18/13 0545 02/19/13 0600 02/20/13 0555  WBC 10.0  < > 7.0 5.1 5.6  NEUTROABS 9.6*  --   --   --   --   HGB 13.7  < > 11.8* 10.9* 11.1*  HCT 39.7  < > 34.7* 30.6* 32.6*  MCV 94.5  < > 94.0 91.6 92.4  PLT 113*  < > 92* 86* 104*  < > = values in this interval not displayed. Cardiac Enzymes:  Recent Labs  02/17/13 0850  TROPONINI <0.30   BNP: No components found with this basename: POCBNP,  CBG: No results found for this basename: GLUCAP,  in the last 8760 hours  Radiological Exams: Dg Chest 1 View  02/17/2013   *RADIOLOGY REPORT*  Clinical  Data: Fall  CHEST - 1 VIEW  Comparison: None.  Findings: Cardiomediastinal silhouette appears normal.  No acute pulmonary disease is noted.  Bony thorax is intact.  IMPRESSION: No acute cardiopulmonary abnormality seen.   Original Report Authenticated By: Lupita Raider., M.D.   Dg Hip Complete Left  02/17/2013   *RADIOLOGY REPORT*  Clinical Data: Fall, left hip pain  LEFT HIP - COMPLETE 2+ VIEW  Comparison: 11/26/2012 CT exam  Findings:  Degenerative changes noted of both hips.  Bones are osteopenic.  There is a nondisplaced radiolucent fracture line of the left hip subcapital femoral neck. No associated subluxation or dislocation.  Right pelvic calcifications noted consistent with chronic bladder calculi.  IMPRESSION: Acute nondisplaced left subcapital femoral neck fracture   Original Report Authenticated By: Judie Petit. Miles Costain, M.D.   Dg  Hip Operative Left  02/17/2013   *RADIOLOGY REPORT*  Clinical Data: Hip fracture  OPERATIVE LEFT HIP Comparison: 0800 hours  Findings: Three cancellous screws transfix a left femoral neck fracture.  Anatomic alignment.  No breakage or loosening of the hardware.  IMPRESSION: ORIF left femoral neck fracture.  Anatomic.   Original Report Authenticated By: Jolaine Click, M.D.   Ct Head Wo Contrast  02/17/2013   *RADIOLOGY REPORT*  Clinical Data: Fall.  CT HEAD WITHOUT CONTRAST  Technique:  Contiguous axial images were obtained from the base of the skull through the vertex without contrast.  Comparison: No comparison head CT.  Comparison sinus CT 04/08/2011.  Findings: Motion degraded exam.  No obvious skull fracture or intracranial hemorrhage.  Small scalp hematoma right parietal region suspected.  Atrophy.  Ventricular prominence felt to represent combination of atrophy and mild hydrocephalus.  Small vessel disease type changes without CT evidence of large acute infarct.  No intracranial mass lesion detected on this unenhanced exam.  IMPRESSION: Motion degraded exam.  No obvious skull  fracture or intracranial hemorrhage.  Small scalp hematoma right parietal region suspected.  Atrophy.  Ventricular prominence felt to represent combination of atrophy and mild hydrocephalus.  Small vessel disease type changes without CT evidence of large acute infarct.   Original Report Authenticated By: Lacy Duverney, M.D.   Dg Pelvis Portable  02/17/2013   *RADIOLOGY REPORT*  Clinical Data: Postop left hip pinning  PORTABLE PELVIS  Comparison: Preoperative pelvis and left hip films of 02/17/2013  Findings: Three compression nails are present through the left femoral head and neck for fixation of the left femoral neck fracture.  No complicating features are seen.  The pelvic rami are intact.  There are degenerative changes in both hip joints right greater than left.  Rounded faint calcifications in the pelvis could represent bladder calculi.  IMPRESSION: 1.  Pinning of left femoral neck fracture with no complicating features.  2.  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  3.  Question urinary bladder calculi.   Original Report Authenticated By: Dwyane Dee, M.D.   Dg Hip Portable 1 View Left  02/17/2013   *RADIOLOGY REPORT*  Clinical Data: Left hip fracture, status post fixation PORTABLE LEFT HIP - 1 VIEW  Comparison: 02/17/2013  Findings: Cross-table lateral view demonstrates screw fixation of the subcapital femoral neck fracture.  Alignment grossly anatomic.  IMPRESSION: Expected appearance status post left hip fracture screw fixation   Original Report Authenticated By: Judie Petit. Miles Costain, M.D.    Assessment/Plan BPH (benign prostatic hyperplasia) stable without complaints at this time he continuesProscar and Flomax   Thrombocytopenia  in hospital-- PLT at baseline when he was discharged from hospital will follow up cbc    Fracture of femoral neck, left, sequela S/p left femoral neck percutaneous pinning of the hip; pain well controlled; at [facility] for STR; Lovenox for 3 weeks for  DVT prophylactic     Labs/tests ordered Cbc and bmp

## 2013-02-24 ENCOUNTER — Non-Acute Institutional Stay (SKILLED_NURSING_FACILITY): Payer: Medicare Other | Admitting: Internal Medicine

## 2013-02-24 ENCOUNTER — Encounter: Payer: Self-pay | Admitting: Internal Medicine

## 2013-02-24 DIAGNOSIS — N400 Enlarged prostate without lower urinary tract symptoms: Secondary | ICD-10-CM

## 2013-02-24 DIAGNOSIS — K219 Gastro-esophageal reflux disease without esophagitis: Secondary | ICD-10-CM

## 2013-02-24 DIAGNOSIS — S72009D Fracture of unspecified part of neck of unspecified femur, subsequent encounter for closed fracture with routine healing: Secondary | ICD-10-CM

## 2013-02-24 DIAGNOSIS — E785 Hyperlipidemia, unspecified: Secondary | ICD-10-CM

## 2013-02-24 DIAGNOSIS — S72002D Fracture of unspecified part of neck of left femur, subsequent encounter for closed fracture with routine healing: Secondary | ICD-10-CM

## 2013-02-24 NOTE — Progress Notes (Signed)
MRN: [id number] Name: [name]  Sex: male Age: 79 y.o. DOB: [birth year]  PSC #:  Facility/Room: Level Of Care: SNF Provider: Merrilee Seashore D Emergency Contacts: Extended Emergency Contact Information Primary Emergency Contact: Milagros Reap [place] Home Phone: [phone] Mobile Phone: [phone] Relation: Daughter Secondary Emergency Contact: Nobie Putnam [place] Home Phone: [phone] Mobile Phone: [phone] Relation: Son  Code Status:   Allergies: Review of patient's allergies indicates no known allergies.  Chief Complaint  Patient presents with  . nursing home admission    HPI: Patient is 79 y.o. male who  Past Medical History  Diagnosis Date  . BPH (benign prostatic hyperplasia)   . GERD (gastroesophageal reflux disease)   . Hyperlipidemia   . Fracture of femoral neck, left 02/17/2013    Past Surgical History  Procedure Laterality Date  . Colon surgery    . Percutaneous pinning Left 02/17/2013    Procedure: PERCUTANEOUS PINNING HIP;  Surgeon: Eulas Post, MD;  Location: [facility] OR;  Service: Orthopedics;  Laterality: Left;      Medication List       This list is accurate as of: 02/24/13  4:05 PM.  Always use your most recent med list.               acetaminophen 325 MG tablet  Commonly known as:  TYLENOL  Take 2 tablets (650 mg total) by mouth every 6 (six) hours as needed.     bisacodyl 10 MG suppository  Commonly known as:  DULCOLAX  Place 1 suppository (10 mg total) rectally daily as needed.     enoxaparin 40 MG/0.4ML injection  Commonly known as:  LOVENOX  Inject 0.4 mL (40 mg total) into the skin daily. X 3 weeks     finasteride 5 MG tablet  Commonly known as:  PROSCAR  Take 5 mg by mouth daily.     HYDROcodone-acetaminophen 5-325 MG per tablet  Commonly known as:  NORCO  Take 1-2 tablets by mouth every 6 (six) hours as needed for pain. MAXIMUM TOTAL ACETAMINOPHEN DOSE IS 4000 MG PER  DAY     sennosides-docusate sodium 8.6-50 MG tablet  Commonly known as:  SENOKOT-S  Take 2 tablets by mouth daily.     tamsulosin 0.4 MG Caps capsule  Commonly known as:  FLOMAX  Take 0.4 mg by mouth daily.        No orders of the defined types were placed in this encounter.    Immunization History  Administered Date(s) Administered  . Pneumococcal Polysaccharide 02/21/2013    History  Substance Use Topics  . Smoking status: Never Smoker   . Smokeless tobacco: Not on file  . Alcohol Use: No    Review of Systems  DATA OBTAINED: from patient GENERAL: Feels well no fevers, fatigue, appetite changes SKIN: No itching, rash HEENT-no c/o RESPIRATORY: No cough, wheezing, SOB CARDIAC: No chest pain, palpitations, lower extremity edema  GI: No abdominal pain, No N/V/D or constipation, No heartburn or reflux  GU: No dysuria, frequency or urgency, or incontinence  MUSCULOSKELETAL: c/o little pain in hip NEUROLOGIC:no c/o PSYCHIATRIC: No overt anxiety or sadness. Sleeps well. No behavior issue    Filed Vitals:   02/24/13 1546  BP: 130/62  Pulse: 55  Temp: 97 F (36.1 C)  Resp: 16    Physical Exam  GENERAL APPEARANCE: Alert, conversant. Appropriately groomed. No acute distress.  SKIN: No diaphoresis rash, or wounds HEAD: Normocephalic, atraumatic  EYES: Conjunctiva/lids clear.  Pupils round, reactive. EOMs intact.  EARS: External exam WNL,  Hearing grossly normal.  NOSE: No deformity or discharge.  MOUTH/THROAT: Lips w/o lesions.very few teeth  RESPIRATORY: Breathing is even, unlabored. Lung sounds are clear   CARDIOVASCULAR: Heart RRR no murmurs, rubs or gallops. No peripheral edema.  ARTERIAL: radial pulse 2+, DP pulse 1+  VENOUS: No varicosities. No venous stasis skin changes  GASTROINTESTINAL: Abdomen is soft, non-tender, not distended w/ normal bowel sounds. MUSCULOSKELETAL: No abnormal joints or musculature, arthritic changes toes NEUROLOGIC: Oriented X3.  Cranial nerves 2-12 grossly intact. Moves all extremities no tremor. PSYCHIATRIC: Mood and affect appropriate to situation, no behavioral issues  Patient Active Problem List   Diagnosis Date Noted  . Fracture of femoral neck, left 02/17/2013  . BPH (benign prostatic hyperplasia)   . GERD (gastroesophageal reflux disease)   . Hyperlipidemia     CBC    Component Value Date/Time   WBC 5.6 02/20/2013 0555   RBC 3.53* 02/20/2013 0555   HGB 11.1* 02/20/2013 0555   HCT 32.6* 02/20/2013 0555   PLT 104* 02/20/2013 0555   MCV 92.4 02/20/2013 0555   LYMPHSABS 0.1* 02/17/2013 0850   MONOABS 0.3 02/17/2013 0850   EOSABS 0.0 02/17/2013 0850   BASOSABS 0.0 02/17/2013 0850    CMP     Component Value Date/Time   NA 134* 02/19/2013 0600   K 4.3 02/19/2013 0600   CL 105 02/19/2013 0600   CO2 23 02/19/2013 0600   GLUCOSE 105* 02/19/2013 0600   BUN 21 02/19/2013 0600   CREATININE 0.76 02/19/2013 0600   CALCIUM 8.4 02/19/2013 0600   GFRNONAA 81* 02/19/2013 0600   GFRAA >90 02/19/2013 0600    Assessment and Plan  Fracture of femoral neck, left For PT and OT; needs f/u with Dr Teryl Lucy in 2 weeks;lovenox 40 mg daily for 3 weeks  GERD (gastroesophageal reflux disease) H/o;currently on no meds  BPH (benign prostatic hyperplasia) On Proscar and tamulosin-will continue  Hyperlipidemia H/o - have no info, on no meds, pt doesn't know-will check fasting lipids   Margit Hanks, MD

## 2013-02-24 NOTE — Assessment & Plan Note (Signed)
H/o;currently on no meds

## 2013-02-24 NOTE — Assessment & Plan Note (Signed)
H/o - have no info, on no meds, pt doesn't know-will check fasting lipids

## 2013-02-24 NOTE — Assessment & Plan Note (Addendum)
For PT and OT; needs f/u with Dr Teryl Lucy in 2 weeks;lovenox 40 mg daily for 3 weeks

## 2013-02-24 NOTE — Assessment & Plan Note (Signed)
On Proscar and tamulosin-will continue

## 2013-02-28 ENCOUNTER — Non-Acute Institutional Stay (SKILLED_NURSING_FACILITY): Payer: Medicare Other | Admitting: Nurse Practitioner

## 2013-02-28 DIAGNOSIS — D696 Thrombocytopenia, unspecified: Secondary | ICD-10-CM

## 2013-02-28 DIAGNOSIS — R319 Hematuria, unspecified: Secondary | ICD-10-CM

## 2013-02-28 DIAGNOSIS — N400 Enlarged prostate without lower urinary tract symptoms: Secondary | ICD-10-CM

## 2013-02-28 NOTE — Progress Notes (Signed)
Patient ID: [name], male   DOB: [birth year], 79 y.o.   MRN: [id number]  Nursing Home Location:  [facility]   Place of Service: SNF (31)  Chief Complaint  Patient presents with  . Acute Visit    HPI:  [name] is a 79 y.o. male has a past medical history significant for BPH, GERD who is at [facility] for STR due to left hip fracture s/p left femoral neck percutaneous pinning of the hip on 02/17/2013. Nursing requested pt to be seen due to blood in urine. Pt reports this happens occasionally and also reports dysuria that occurred yesterday and this morning but has improved. Pt denies fever, chills, shortness of breath, chest pain or palpitations.     Review of Systems:   DATA OBTAINED: from patient, nurse, medical record GENERAL: no fevers, fatigue, appetite changes SKIN: No increased briusing  NOSE: no nose bleeds   RESPIRATORY: No cough, wheezing, SOB CARDIAC: No chest pain, palpitations, lower extremity edema  GI: No abdominal pain, No N/V/D or constipation, No heartburn or reflux  GU: has dysuria, no frequency or urgency MUSCULOSKELETAL: No unrelieved bone/joint pain NEUROLOGIC: Awake, alert, appropriate to situation, No change in mental status. Moves all four, no focal deficits   Medications: Patient's Medications  New Prescriptions   No medications on file  Previous Medications   ACETAMINOPHEN (TYLENOL) 325 MG TABLET    Take 2 tablets (650 mg total) by mouth every 6 (six) hours as needed.   BISACODYL (DULCOLAX) 10 MG SUPPOSITORY    Place 1 suppository (10 mg total) rectally daily as needed.   ENOXAPARIN (LOVENOX) 40 MG/0.4ML INJECTION    Inject 0.4 mL (40 mg total) into the skin daily. X 3 weeks   FINASTERIDE (PROSCAR) 5 MG TABLET    Take 5 mg by mouth daily.   HYDROCODONE-ACETAMINOPHEN (NORCO) 5-325 MG PER TABLET    Take 1-2 tablets by mouth every 6 (six) hours as needed for pain. MAXIMUM TOTAL ACETAMINOPHEN DOSE IS 4000 MG PER DAY   SENNOSIDES-DOCUSATE SODIUM (SENOKOT-S) 8.6-50 MG TABLET    Take 2 tablets by mouth daily.   TAMSULOSIN (FLOMAX) 0.4 MG CAPS    Take 0.4 mg by mouth daily.  Modified Medications   No medications on file  Discontinued Medications   No medications on file     Physical Exam:  Filed Vitals:   02/28/13 2134  BP: 107/67  Pulse: 81  Temp: 98.6 F (37 C)  Resp: 20  SpO2: 94%     GENERAL APPEARANCE: Alert, conversant. Appropriately groomed. No acute distress.  SKIN: No ecchymosis  RESPIRATORY: Breathing is even, unlabored. Lung sounds are clear   CARDIOVASCULAR: Heart RRR no murmurs, rubs or gallops. No peripheral edema.  ARTERIAL: radial pulse 2+ GASTROINTESTINAL: Abdomen is soft, non-tender, not distended w/ normal bowel sounds.GENITOURINARY: Bladder non tender, not distended  MUSCULOSKELETAL: No abnormal joints or musculature NEUROLOGIC: Oriented X3. Cranial nerves 2-12 grossly intact. Moves all extremities no tremor. PSYCHIATRIC: Mood and affect appropriate to situation, no behavioral issues  Labs reviewed/Significant Diagnostic Results:  CBC with Diff       Result: 02/25/2013 4:27 PM    ( Status: F )       C     WBC  5.0        4.0-10.5  K/uL  SLN       RBC  3.24     L  4.22-5.81  MIL/uL  SLN  Hemoglobin  10.1     L  13.0-17.0  g/dL  SLN       Hematocrit  29.8     L  39.0-52.0  %  SLN       MCV  92.0        78.0-100.0  fL  SLN       MCH  31.2        26.0-34.0  pg  SLN       MCHC  33.9        30.0-36.0  g/dL  SLN       RDW  40.9        11.5-15.5  %  SLN       Platelet Count  213        150-400  K/uL  SLN       Granulocyte %  64        43-77  %  SLN       Absolute Gran  3.2        1.7-7.7  K/uL  SLN       Lymph %  25        12-46  %  SLN       Absolute Lymph  1.3        0.7-4.0  K/uL  SLN       Mono %  10        3-12  %  SLN       Absolute Mono  0.5        0.1-1.0  K/uL  SLN       Eos %  1        0-5  %  SLN       Absolute Eos  0.1        0.0-0.7  K/uL  SLN        Baso %  0        0-1  %  SLN       Absolute Baso  0.0        0.0-0.1  K/uL  SLN       Smear Review    SLN  C    Lipid Profile       Result: 02/25/2013 3:30 PM    ( Status: F )            Cholesterol  119        0-200  mg/dL  SLN  C     Triglyceride  69        <150  mg/dL  SLN       HDL Cholesterol  30     L  >39  mg/dL  SLN       Total Chol/HDL Ratio  4.0         Ratio  SLN       VLDL Cholesterol (Calc)  14        0-40  mg/dL  SLN       LDL Cholesterol (Calc)  75      Assessment/Plan  1. Hematuria Pt with history of BPH; reports occasional blood in urine however pt  on Lovenox due to pinning of the hip-- will dc lovenox at this time and encouraged pt to get out of bed and move LE frequently to avoid DVT;   will get UA c&S due to pain on urination but will dc Lovenox at this time due to blood in urine and drop in hgb from  hospitialization  (hgb on 02/20/13 11.1 and on 8/22 was 10.1) will follow up CBC    2. BPH (benign prostatic hyperplasia) Stable   3. Thrombocytopenia plts remain stable

## 2013-03-23 ENCOUNTER — Encounter: Payer: Self-pay | Admitting: Nurse Practitioner

## 2013-03-23 ENCOUNTER — Non-Acute Institutional Stay (SKILLED_NURSING_FACILITY): Payer: Medicare Other | Admitting: Nurse Practitioner

## 2013-03-23 DIAGNOSIS — S72009S Fracture of unspecified part of neck of unspecified femur, sequela: Secondary | ICD-10-CM

## 2013-03-23 DIAGNOSIS — N400 Enlarged prostate without lower urinary tract symptoms: Secondary | ICD-10-CM

## 2013-03-23 DIAGNOSIS — S72002S Fracture of unspecified part of neck of left femur, sequela: Secondary | ICD-10-CM

## 2013-03-23 DIAGNOSIS — R197 Diarrhea, unspecified: Secondary | ICD-10-CM

## 2013-03-23 DIAGNOSIS — K219 Gastro-esophageal reflux disease without esophagitis: Secondary | ICD-10-CM

## 2013-03-23 NOTE — Progress Notes (Signed)
Patient ID: [name], male   DOB: Jun 19, 1926, 79 y.o.   MRN: [id number]  Nursing Home Location:  [facility]   Place of Service: SNF (31)  Chief Complaint  Patient presents with  . Medical Managment of Chronic Issues    HPI:  MACALLAN ORD is a 79 y.o. male has a past medical history significant for BPH, GERD who is at [facility] for STR due to left hip fracture s/p left femoral neck percutaneous pinning of the hip on 02/17/2013. Pt is being seen today for routine follow up.  Pt doing well with rehab. Pt and staff report increase in diarrhea and abdominal pain.   Pt denies fever, chills, shortness of breath, chest pain or palpitations.    Review of Systems:  DATA OBTAINED: from patient, nurse, medical record, family member GENERAL: no fevers, fatigue, appetite changes SKIN: No itching, rash or wounds EYES: No eye pain, redness, discharge EARS: No earache, tinnitus, change in hearing NOSE: No congestion, drainage or bleeding  MOUTH/THROAT: No mouth or tooth pain, No sore throat, No difficulty chewing or swallowing  RESPIRATORY: No cough, wheezing, SOB CARDIAC: No chest pain, palpitations, lower extremity edema  GI: No abdominal pain, No N/V or constipation but has diarrhea, No heartburn or reflux  GU: No dysuria, frequency or urgency MUSCULOSKELETAL: No unrelieved bone/joint pain NEUROLOGIC: Awake, alert, appropriate to situation, No change in mental status. Moves all four, no focal deficits PSYCHIATRIC: No overt anxiety or sadness. Sleeps well. No behavior issue.     Medications: Patient's Medications  New Prescriptions   No medications on file  Previous Medications   ACETAMINOPHEN (TYLENOL) 325 MG TABLET    Take 2 tablets (650 mg total) by mouth every 6 (six) hours as needed.   BISACODYL (DULCOLAX) 10 MG SUPPOSITORY    Place 1 suppository (10 mg total) rectally daily as needed.   FINASTERIDE (PROSCAR) 5 MG TABLET    Take 5 mg by mouth daily.    HYDROCODONE-ACETAMINOPHEN (NORCO) 5-325 MG PER TABLET    Take 1-2 tablets by mouth every 6 (six) hours as needed for pain. MAXIMUM TOTAL ACETAMINOPHEN DOSE IS 4000 MG PER DAY   SENNOSIDES-DOCUSATE SODIUM (SENOKOT-S) 8.6-50 MG TABLET    Take 2 tablets by mouth daily.   TAMSULOSIN (FLOMAX) 0.4 MG CAPS    Take 0.4 mg by mouth daily.  Modified Medications   No medications on file  Discontinued Medications   ENOXAPARIN (LOVENOX) 40 MG/0.4ML INJECTION    Inject 0.4 mL (40 mg total) into the skin daily. X 3 weeks     Physical Exam:  Filed Vitals:   03/23/13 1227  BP: 126/47  Pulse: 65  Temp: 97.6 F (36.4 C)  Resp: 18  Weight: 139 lb (63.05 kg)    GENERAL APPEARANCE: Alert, conversant. Appropriately groomed. No acute distress.  SKIN: No diaphoresis rash, or wounds- LEFT incision well healed  HEAD: Normocephalic, atraumatic  EYES: Conjunctiva/lids clear. Pupils round, reactive. EOMs intact.  EARS: External exam WNL, canals clear. Hearing grossly normal.  NOSE: No deformity or discharge.  MOUTH/THROAT: Lips w/o lesions. Mouth and throat normal. Tongue moist, w/o lesion.  NECK: No thyroid tenderness, enlargement or nodule  RESPIRATORY: Breathing is even, unlabored. Lung sounds are clear   CARDIOVASCULAR: Heart RRR no murmurs, rubs or gallops. No peripheral edema.  ARTERIAL: radial pulse 2+ GASTROINTESTINAL: Abdomen is soft, non-tender, not distended w/ normal bowel sounds. GENITOURINARY: Bladder non tender, not distended  MUSCULOSKELETAL: No abnormal joints or musculature  NEUROLOGIC: Oriented X3. Cranial nerves 2-12 grossly intact. Moves all extremities no tremor. PSYCHIATRIC: Mood and affect appropriate to situation, no behavioral issues  Labs reviewed/Significant Diagnostic Results:  WBC 6.2     4.0-10.5 K/uL SLN   RBC 3.33   L 4.22-5.81 MIL/uL SLN   Hemoglobin 10.4   L 13.0-17.0 g/dL SLN   Hematocrit 40.9   L 39.0-52.0 % SLN   MCV 92.2     78.0-100.0 fL SLN   MCH 31.2      26.0-34.0 pg SLN   MCHC 33.9     30.0-36.0 g/dL SLN   RDW 81.1     91.4-78.2 % SLN   Platelet Count 265     150-400 K/uL SLN   Lipid Profile    Result: 02/28/2013 3:06 PM   ( Status: F )       Cholesterol 119     0-200 mg/dL SLN C Triglyceride 60     <150 mg/dL SLN   HDL Cholesterol 37   L >39 mg/dL SLN   Total Chol/HDL Ratio 3.2      Ratio SLN   VLDL Cholesterol (Calc) 12     0-40 mg/dL SLN   LDL Cholesterol (Calc) 70      Assessment/Plan 1. Anemia Will follow up CBC   2. Fracture of femoral neck, left, sequela Doing well with therapy; pain well controlled on current medications  3. BPH (benign prostatic hyperplasia) Stable conts to follow up with urology  4. Diarrhea Will stop laxative. Abdomen non-tender and soft; will get and cont to monitor; will get cmp, amylase and lipase

## 2013-04-04 ENCOUNTER — Non-Acute Institutional Stay (SKILLED_NURSING_FACILITY): Payer: Medicare Other | Admitting: Nurse Practitioner

## 2013-04-04 ENCOUNTER — Encounter: Payer: Self-pay | Admitting: Nurse Practitioner

## 2013-04-04 DIAGNOSIS — R300 Dysuria: Secondary | ICD-10-CM

## 2013-04-04 DIAGNOSIS — N400 Enlarged prostate without lower urinary tract symptoms: Secondary | ICD-10-CM

## 2013-04-04 DIAGNOSIS — R197 Diarrhea, unspecified: Secondary | ICD-10-CM

## 2013-04-04 DIAGNOSIS — S72002S Fracture of unspecified part of neck of left femur, sequela: Secondary | ICD-10-CM

## 2013-04-04 DIAGNOSIS — E785 Hyperlipidemia, unspecified: Secondary | ICD-10-CM

## 2013-04-04 DIAGNOSIS — S72009S Fracture of unspecified part of neck of unspecified femur, sequela: Secondary | ICD-10-CM

## 2013-04-04 NOTE — Progress Notes (Signed)
Patient ID: [name], male   DOB: 1926/05/06, 79 y.o.   MRN: [id number]   PCP: Leo Grosser, MD   No Known Allergies  Chief Complaint  Patient presents with  . Discharge Note    HPI:  [name] is a 79 y.o. male has a past medical history significant for BPH, GERD went to the hospital after a fall with hip pain. Pt had a complete hip x-ray which showed an acute nondisplaced left subcapital femoral neck fracture. Orthopedics was consulted and patient underwent left femoral neck percutaneous pinning of the hip on 02/17/2013. Patient was closely followed by orthopedics and recommended physical therapy which recommended skilled nursing facility for continuing rehabilitation. Therefore he was transferred to [facility] for ongoing rehab. Pt has exceeded expectations in rehab and is now ready to be discharged home with family and will have HH therapies to follow.  Pt has ongoing dysuria; UA c&s has been sent out this morning Review of Systems:  Review of Systems  Constitutional: Negative for fever and chills.  Respiratory: Negative for cough and shortness of breath.   Cardiovascular: Negative for chest pain and palpitations.  Gastrointestinal: Positive for abdominal pain, diarrhea and constipation. Negative for heartburn, nausea, vomiting, blood in stool and melena.       Staff reports pt complains of constipation therefore he is given laxative or stool softener then he has loose stools   Genitourinary: Positive for dysuria.  Musculoskeletal: Negative for myalgias, joint pain and falls.  Skin: Negative.   Neurological: Negative for dizziness and headaches.     Past Medical History  Diagnosis Date  . BPH (benign prostatic hyperplasia)   . GERD (gastroesophageal reflux disease)   . Hyperlipidemia   . Fracture of femoral neck, left 02/17/2013   Past Surgical History  Procedure Laterality Date  . Colon surgery    . Percutaneous pinning Left 02/17/2013    Procedure:  PERCUTANEOUS PINNING HIP;  Surgeon: Eulas Post, MD;  Location: [facility] OR;  Service: Orthopedics;  Laterality: Left;   Social History:   reports that he has never smoked. He does not have any smokeless tobacco history on file. He reports that he does not drink alcohol or use illicit drugs.  No family history on file.  Medications: Patient's Medications  New Prescriptions   No medications on file  Previous Medications   ACETAMINOPHEN (TYLENOL) 325 MG TABLET    Take 2 tablets (650 mg total) by mouth every 6 (six) hours as needed.   BISACODYL (DULCOLAX) 10 MG SUPPOSITORY    Place 1 suppository (10 mg total) rectally daily as needed.   FINASTERIDE (PROSCAR) 5 MG TABLET    Take 5 mg by mouth daily.   HYDROCODONE-ACETAMINOPHEN (NORCO) 5-325 MG PER TABLET    Take 1-2 tablets by mouth every 6 (six) hours as needed for pain. MAXIMUM TOTAL ACETAMINOPHEN DOSE IS 4000 MG PER DAY   TAMSULOSIN (FLOMAX) 0.4 MG CAPS    Take 0.4 mg by mouth daily.  Modified Medications   No medications on file  Discontinued Medications   SENNOSIDES-DOCUSATE SODIUM (SENOKOT-S) 8.6-50 MG TABLET    Take 2 tablets by mouth daily.     Physical Exam:  Filed Vitals:   04/04/13 1039  BP: 114/61  Pulse: 80  Temp: 97.2 F (36.2 C)  Resp: 18  Weight: 138 lb (62.596 kg)   GENERAL APPEARANCE: Alert, conversant. Appropriately groomed. No acute distress.  SKIN: No diaphoresis rash, or wounds- LEFT femoral incision well healed  HEAD: Normocephalic, atraumatic  EYES: Conjunctiva/lids clear. Pupils round, reactive. EOMs intact.  EARS: External exam WNL, canals clear. Hearing grossly normal.  NOSE: No deformity or discharge.  MOUTH/THROAT: Lips w/o lesions. Mouth and throat normal. Tongue moist, w/o lesion.  NECK: No thyroid tenderness, enlargement or nodule  RESPIRATORY: Breathing is even, unlabored. Lung sounds are clear  CARDIOVASCULAR: Heart RRR no murmurs, rubs or gallops. No peripheral edema.  ARTERIAL: radial pulse  2+  GASTROINTESTINAL: Abdomen is soft, non-tender, not distended w/ normal bowel sounds.  GENITOURINARY: Bladder non tender, not distended  MUSCULOSKELETAL: No abnormal joints or musculature  NEUROLOGIC: Oriented. Cranial nerves 2-12 grossly intact. Moves all extremities no tremor.  PSYCHIATRIC: Mood and affect appropriate to situation, no behavioral issues       Labs reviewed: Basic Metabolic Panel:  Recent Labs  65/78/46 0850 02/17/13 1745 02/18/13 0545 02/19/13 0600  NA 135  --  136 134*  K 3.7  --  4.3 4.3  CL 102  --  103 105  CO2 26  --  23 23  GLUCOSE 109*  --  97 105*  BUN 18  --  17 21  CREATININE 0.87 0.84 0.74 0.76  CALCIUM 9.4  --  8.5 8.4   Liver Function Tests: No results found for this basename: AST, ALT, ALKPHOS, BILITOT, PROT, ALBUMIN,  in the last 8760 hours No results found for this basename: LIPASE, AMYLASE,  in the last 8760 hours No results found for this basename: AMMONIA,  in the last 8760 hours CBC:  Recent Labs  02/17/13 0850  02/18/13 0545 02/19/13 0600 02/20/13 0555  WBC 10.0  < > 7.0 5.1 5.6  NEUTROABS 9.6*  --   --   --   --   HGB 13.7  < > 11.8* 10.9* 11.1*  HCT 39.7  < > 34.7* 30.6* 32.6*  MCV 94.5  < > 94.0 91.6 92.4  PLT 113*  < > 92* 86* 104*  < > = values in this interval not displayed. Cardiac Enzymes:  Recent Labs  02/17/13 0850  TROPONINI <0.30  02/28/2013 WBC 6.2 4.0-10.5 K/uL SLN  RBC 3.33 L 4.22-5.81 MIL/uL SLN  Hemoglobin 10.4 L 13.0-17.0 g/dL SLN  Hematocrit 96.2 L 39.0-52.0 % SLN  MCV 92.2 78.0-100.0 fL SLN  MCH 31.2 26.0-34.0 pg SLN  MCHC 33.9 30.0-36.0 g/dL SLN  RDW 95.2 84.1-32.4 % SLN  Platelet Count 265 150-400 K/uL SLN  Lipid Profile  Result: 02/28/2013 3:06 PM ( Status: F )  Cholesterol 119 0-200 mg/dL SLN C  Triglyceride 60 <150 mg/dL SLN  HDL Cholesterol 37 L >39 mg/dL SLN  Total Chol/HDL Ratio 3.2 Ratio SLN  VLDL Cholesterol (Calc) 12 4-01 mg/dL SLN  LDL Cholesterol (Calc) 70    CBC NO Diff  (Complete Blood Count)       Result: 03/24/2013 3:24 PM    ( Status: F )       C     WBC  5.6        4.0-10.5  K/uL  SLN       RBC  3.30     L  3.87-5.11  MIL/uL  SLN       Hemoglobin  10.3     L  12.0-15.0  g/dL  SLN       Hematocrit  30.3     L  36.0-46.0  %  SLN       MCV  91.8        78.0-100.0  fL  SLN       MCH  31.2        26.0-34.0  pg  SLN       MCHC  34.0        30.0-36.0  g/dL  SLN       RDW  16.1        11.5-15.5  %  SLN       Platelet Count  206        150-400  K/uL  SLN      Comprehensive Metabolic Panel       Result: 03/24/2013 2:52 PM    ( Status: F )            Sodium  137        135-145  mEq/L  SLN       Potassium  4.0        3.5-5.3  mEq/L  SLN       Chloride  105        96-112  mEq/L  SLN       CO2  26        19-32  mEq/L  SLN       Glucose  91        70-99  mg/dL  SLN       BUN  17        6-23  mg/dL  SLN       Creatinine  0.73        0.50-1.10  mg/dL  SLN       Bilirubin, Total  0.4        0.3-1.2  mg/dL  SLN       Alkaline Phosphatase  62        39-117  U/L  SLN       AST/SGOT  12        0-37  U/L  SLN       ALT/SGPT  11        0-35  U/L  SLN       Total Protein  5.5     L  6.0-8.3  g/dL  SLN       Albumin  2.8     L  3.5-5.2  g/dL  SLN       Calcium  8.4        8.4-10.5  mg/dL  SLN      Amylase       Result: 03/24/2013 2:52 PM    ( Status: F )            Amylase  47        0-105  U/L  SLN      Lipase       Result: 03/24/2013 2:52 PM    ( Status: F )            Lipase  44      Assessment/Plan   1. Dysuria Ongoing for over a month; UA C&S was negative; repeat UA C&S sent this morninng; we will fwd these results to urology and PCP; will start pt on macrobid 100 mg BID for 1 week  2. Fracture of femoral neck, left, sequela Well healed incision; doing well with therapy   3. BPH (benign prostatic hyperplasia) Stable; will need follow up with urology   4. Hyperlipidemia Most recent LDL stable; pt not currently on medications  5. Diarrhea To hold off on  all laxities and stool softeners; may  cont benefiber 1-2 times daily    pt is stable for discharge-will need PT/OT/Nursing per home health. No DME needed. Rx written.  will need to follow up with PCP as soon as possible to follow up urine and dairrhea.

## 2013-04-08 ENCOUNTER — Encounter: Payer: Self-pay | Admitting: Family Medicine

## 2013-04-08 ENCOUNTER — Ambulatory Visit (INDEPENDENT_AMBULATORY_CARE_PROVIDER_SITE_OTHER): Payer: Medicare Other | Admitting: Family Medicine

## 2013-04-08 VITALS — BP 106/48 | HR 74 | Temp 97.6°F | Resp 18 | Wt 137.0 lb

## 2013-04-08 DIAGNOSIS — Z09 Encounter for follow-up examination after completed treatment for conditions other than malignant neoplasm: Secondary | ICD-10-CM

## 2013-04-08 DIAGNOSIS — N400 Enlarged prostate without lower urinary tract symptoms: Secondary | ICD-10-CM

## 2013-04-08 DIAGNOSIS — R300 Dysuria: Secondary | ICD-10-CM

## 2013-04-08 LAB — URINALYSIS, MICROSCOPIC ONLY

## 2013-04-08 LAB — URINALYSIS, ROUTINE W REFLEX MICROSCOPIC
Bilirubin Urine: NEGATIVE
Glucose, UA: NEGATIVE mg/dL
Protein, ur: >300 mg/dL — AB
Urobilinogen, UA: 1 mg/dL (ref 0.0–1.0)

## 2013-04-08 NOTE — Progress Notes (Signed)
Subjective:    Patient ID: [name], male    DOB: May 06, 1926, 79 y.o.   MRN: [id number]  HPI Patient was admitted to the hospital August 18 after a fall when he suffered a left femoral neck fracture. I reviewed the hospital discharge summary. He underwent percutaneous pinning by Dr. Dion Saucier.  He did well postoperatively and was discharged to a skilled nursing facility for rehabilitation. He completed 3 weeks of DVT prophylaxis with Lovenox. He was discharged home on Flomax and Proscar which were his maintenance medications for BPH. However over the last 2 weeks he reports increasing dysuria. It is a difficult history to obtain due to a speech impediment that the patient has but I believe he is complaining of dysuria on a daily basis, increase frequency, urgency, hesitancy, and weak stream. He denies any fevers or chills. He is having some constipation and he has been taking narcotic pain medication for the hip fracture. He states he is not currently taking a stool softener. Urinalysis today in the office is significant for blood, protein, leukocyte esterase. Past Medical History  Diagnosis Date  . BPH (benign prostatic hyperplasia)   . GERD (gastroesophageal reflux disease)   . Hyperlipidemia   . Fracture of femoral neck, left 02/17/2013   Past Surgical History  Procedure Laterality Date  . Colon surgery    . Percutaneous pinning Left 02/17/2013    Procedure: PERCUTANEOUS PINNING HIP;  Surgeon: Eulas Post, MD;  Location: [facility] OR;  Service: Orthopedics;  Laterality: Left;   Current Outpatient Prescriptions on File Prior to Visit  Medication Sig Dispense Refill  . bisacodyl (DULCOLAX) 10 MG suppository Place 1 suppository (10 mg total) rectally daily as needed.  12 suppository  0  . finasteride (PROSCAR) 5 MG tablet Take 5 mg by mouth daily.      Marland Kitchen HYDROcodone-acetaminophen (NORCO) 5-325 MG per tablet Take 1-2 tablets by mouth every 6 (six) hours as needed for pain. MAXIMUM TOTAL  ACETAMINOPHEN DOSE IS 4000 MG PER DAY  240 tablet  5  . tamsulosin (FLOMAX) 0.4 MG CAPS Take 0.4 mg by mouth daily.       No current facility-administered medications on file prior to visit.   No Known Allergies History   Social History  . Marital Status: Married    Spouse Name: N/A    Number of Children: N/A  . Years of Education: N/A   Occupational History  . Not on file.   Social History Main Topics  . Smoking status: Never Smoker   . Smokeless tobacco: Not on file  . Alcohol Use: No  . Drug Use: No  . Sexual Activity: Not on file   Other Topics Concern  . Not on file   Social History Narrative  . No narrative on file      Review of Systems  All other systems reviewed and are negative.       Objective:   Physical Exam  Vitals reviewed. Constitutional: He is oriented to person, place, and time. He appears well-developed and well-nourished. No distress.  Eyes: Conjunctivae are normal. No scleral icterus.  Neck: Neck supple. No JVD present. No thyromegaly present.  Cardiovascular: Normal rate, regular rhythm and normal heart sounds.   Pulmonary/Chest: Effort normal and breath sounds normal. No respiratory distress. He has no wheezes. He has no rales.  Abdominal: Soft. Bowel sounds are normal. He exhibits no distension and no mass. There is no tenderness. There is no rebound and no guarding.  Musculoskeletal:  He exhibits no edema.  Lymphadenopathy:    He has no cervical adenopathy.  Neurological: He is alert and oriented to person, place, and time. Coordination normal.  Skin: He is not diaphoretic.   patient has no CVA tenderness. He is currently ambulating with a walker due to deconditioning, balance, and weakness.        Assessment & Plan:  Dysuria - Plan: Urine culture, Urinalysis, Routine w reflex microscopic  BPH (benign prostatic hyperplasia)  Hospital discharge follow-up  Patient is doing well now at home. He is receiving home physical therapy  every other day. This seems to be helping. I did recommend starting daily stool softener to prevent constipation from narcotics. I recommended MiraLax. The patient likely has a urinary tract infection versus prostatitis. Therefore I recommended Bactrim double strength tablets one by mouth twice a day. The patient has a prescription for 7 days. I will recheck him in one week we will likely continue prescription for up to 2-3 more additional weeks depending upon his symptomatology and results of the urine culture.

## 2013-04-09 LAB — URINE CULTURE: Colony Count: >=100000

## 2013-04-13 ENCOUNTER — Telehealth: Payer: Self-pay | Admitting: Family Medicine

## 2013-04-13 NOTE — Telephone Encounter (Signed)
Wants to continue PT for 2-3 times a week for 5 weeks For his safe stride balance program . Call and give verbal ok.

## 2013-04-14 NOTE — Telephone Encounter (Signed)
ok 

## 2013-04-14 NOTE — Telephone Encounter (Signed)
Is this ok to authorize?

## 2013-04-15 ENCOUNTER — Encounter: Payer: Self-pay | Admitting: Family Medicine

## 2013-04-15 ENCOUNTER — Ambulatory Visit (INDEPENDENT_AMBULATORY_CARE_PROVIDER_SITE_OTHER): Payer: Medicare Other | Admitting: Family Medicine

## 2013-04-15 VITALS — BP 110/60 | HR 70 | Temp 97.1°F | Resp 14 | Wt 137.0 lb

## 2013-04-15 DIAGNOSIS — R310 Gross hematuria: Secondary | ICD-10-CM

## 2013-04-15 DIAGNOSIS — N390 Urinary tract infection, site not specified: Secondary | ICD-10-CM

## 2013-04-15 LAB — URINALYSIS, MICROSCOPIC ONLY
Crystals: NONE SEEN
Squamous Epithelial / LPF: NONE SEEN

## 2013-04-15 LAB — URINALYSIS, ROUTINE W REFLEX MICROSCOPIC

## 2013-04-15 NOTE — Addendum Note (Signed)
Addended by: Reginia Forts on: 04/15/2013 12:23 PM   Modules accepted: Orders

## 2013-04-15 NOTE — Progress Notes (Signed)
Subjective:    Patient ID: [name], male    DOB: Nov 12, 1925, 79 y.o.   MRN: [id number]  HPI 04/08/13 Patient was admitted to the hospital August 18 after a fall when he suffered a left femoral neck fracture. I reviewed the hospital discharge summary. He underwent percutaneous pinning by Dr. Dion Saucier.  He did well postoperatively and was discharged to a skilled nursing facility for rehabilitation. He completed 3 weeks of DVT prophylaxis with Lovenox. He was discharged home on Flomax and Proscar which were his maintenance medications for BPH. However over the last 2 weeks he reports increasing dysuria. It is a difficult history to obtain due to a speech impediment that the patient has but I believe he is complaining of dysuria on a daily basis, increase frequency, urgency, hesitancy, and weak stream. He denies any fevers or chills. He is having some constipation and he has been taking narcotic pain medication for the hip fracture. He states he is not currently taking a stool softener. Urinalysis today in the office is significant for blood, protein, leukocyte esterase.  At that time, my plan was: UTI (urinary tract infection) - Plan: Urinalysis, Routine w reflex microscopic  Gross hematuria - Plan: Ambulatory referral to Urology  Patient is doing well now at home. He is receiving home physical therapy every other day. This seems to be helping. I did recommend starting daily stool softener to prevent constipation from narcotics. I recommended MiraLax. The patient likely has a urinary tract infection versus prostatitis. Therefore I recommended Bactrim double strength tablets one by mouth twice a day. The patient has a prescription for 7 days. I will recheck him in one week we will likely continue prescription for up to 2-3 more additional weeks depending upon his symptomatology and results of the urine culture.  04/15/13 Urine culture revealed Streptococcus viridans. The patient has finished one week  of Bactrim. Unfortunately last night he had an episode of gross hematuria. He brought a sample of the urine with him today. Office Visit on 04/15/2013  Component Date Value Range Status  . Squamous Epithelial / LPF 04/15/2013 NONE SEEN  RARE Final  . Crystals 04/15/2013 NONE SEEN  NONE SEEN Final  . Casts 04/15/2013 NONE SEEN  NONE SEEN Final  . WBC, UA 04/15/2013 7-10* <3 WBC/hpf Final  . RBC / HPF 04/15/2013 TNTC* <3 RBC/hpf Final  . Bacteria, UA 04/15/2013 FEW* RARE Final  . Urine-Other 04/15/2013 TOO BLOODY TO DIP OR SPIN   Corrected   Comment:                            Amended report.   Patient was seen for gross hematuria in May. A CT scan was obtained which revealed the following: 1. There is a question of a small soft tissue lesion within the  anterior bladder possibly involving the anteriorly positioned  urinary bladder diverticulum, and a urinary bladder carcinoma  cannot be excluded.  2. Multiple bladder calculi with thickened urinary bladder wall  suggesting possible long-term bladder outlet obstruction.  3. Small gallstones.  4. Three ventral hernias as noted above all located near the  umbilicus.  Patient was referred to urology for a cystoscopy. However the referral never happened. I am not sure what the breakdown occurred. The order was placed and the daughter was notified, however the patient was never contacted.  He then broke his hip in August has recently been discharged from the nursing home.  Past Medical History  Diagnosis Date  . BPH (benign prostatic hyperplasia)   . GERD (gastroesophageal reflux disease)   . Hyperlipidemia   . Fracture of femoral neck, left 02/17/2013   Past Surgical History  Procedure Laterality Date  . Colon surgery    . Percutaneous pinning Left 02/17/2013    Procedure: PERCUTANEOUS PINNING HIP;  Surgeon: Eulas Post, MD;  Location: [facility] OR;  Service: Orthopedics;  Laterality: Left;   Current Outpatient Prescriptions on File  Prior to Visit  Medication Sig Dispense Refill  . bisacodyl (DULCOLAX) 10 MG suppository Place 1 suppository (10 mg total) rectally daily as needed.  12 suppository  0  . finasteride (PROSCAR) 5 MG tablet Take 5 mg by mouth daily.      Marland Kitchen HYDROcodone-acetaminophen (NORCO) 5-325 MG per tablet Take 1-2 tablets by mouth every 6 (six) hours as needed for pain. MAXIMUM TOTAL ACETAMINOPHEN DOSE IS 4000 MG PER DAY  240 tablet  5  . tamsulosin (FLOMAX) 0.4 MG CAPS Take 0.4 mg by mouth daily.       No current facility-administered medications on file prior to visit.   No Known Allergies History   Social History  . Marital Status: Married    Spouse Name: N/A    Number of Children: N/A  . Years of Education: N/A   Occupational History  . Not on file.   Social History Main Topics  . Smoking status: Never Smoker   . Smokeless tobacco: Not on file  . Alcohol Use: No  . Drug Use: No  . Sexual Activity: Not on file   Other Topics Concern  . Not on file   Social History Narrative  . No narrative on file      Review of Systems  All other systems reviewed and are negative.       Objective:   Physical Exam  Vitals reviewed. Constitutional: He is oriented to person, place, and time. He appears well-developed and well-nourished. No distress.  Eyes: Conjunctivae are normal. No scleral icterus.  Neck: Neck supple. No JVD present. No thyromegaly present.  Cardiovascular: Normal rate, regular rhythm and normal heart sounds.   Pulmonary/Chest: Effort normal and breath sounds normal. No respiratory distress. He has no wheezes. He has no rales.  Abdominal: Soft. Bowel sounds are normal. He exhibits no distension and no mass. There is no tenderness. There is no rebound and no guarding.  Musculoskeletal: He exhibits no edema.  Lymphadenopathy:    He has no cervical adenopathy.  Neurological: He is alert and oriented to person, place, and time. Coordination normal.  Skin: He is not  diaphoretic.   patient has no CVA tenderness. He is currently ambulating with a walker due to deconditioning, balance, and weakness.  Prostate exam today reveals a swollen prostate with no palpable nodules or tenderness on exam.      Assessment & Plan:  1. UTI (urinary tract infection) Given the contaminated sample I will send a urine culture. Clinically the patient's urinary tract infection appears to have improved and he denies any dysuria - Urinalysis, Routine w reflex microscopic  2. Gross hematuria I am concerned however the patient may have a pathologic lesion in his bladder that is the cause of his hematuria. He needs cystoscopy to determine the etiology of this.   I placed a consult to urology now and we shall arrange that ASAP. - Ambulatory referral to Urology

## 2013-04-15 NOTE — Telephone Encounter (Signed)
Aware of authorization per vm

## 2013-04-16 LAB — URINE CULTURE
Colony Count: NO GROWTH
Organism ID, Bacteria: NO GROWTH

## 2013-04-29 ENCOUNTER — Telehealth: Payer: Self-pay | Admitting: Family Medicine

## 2013-04-29 NOTE — Telephone Encounter (Signed)
[name]  OT  Needs orders to see [name] and also needs orders for a Nurse Consult.  Please call [name]  At [phone] regarding this matter.

## 2013-05-06 ENCOUNTER — Encounter (HOSPITAL_COMMUNITY): Payer: Self-pay | Admitting: Emergency Medicine

## 2013-05-06 ENCOUNTER — Emergency Department (INDEPENDENT_AMBULATORY_CARE_PROVIDER_SITE_OTHER): Payer: Medicare Other

## 2013-05-06 ENCOUNTER — Emergency Department (HOSPITAL_COMMUNITY)
Admission: EM | Admit: 2013-05-06 | Discharge: 2013-05-06 | Disposition: A | Discharge status: Home / Self Care | Payer: Medicare Other | Source: Home / Self Care | Attending: Family Medicine | Admitting: Family Medicine

## 2013-05-06 DIAGNOSIS — Z8739 Personal history of other diseases of the musculoskeletal system and connective tissue: Secondary | ICD-10-CM

## 2013-05-06 MED ORDER — PREDNISONE 10 MG PO TABS: ORAL_TABLET | ORAL | Status: DC

## 2013-05-06 NOTE — ED Provider Notes (Signed)
CSN: [id number]     Arrival date & time 05/06/13  1045 History   None    Chief Complaint  Patient presents with  . Hand Injury   (Consider location/radiation/quality/duration/timing/severity/associated sxs/prior Treatment) Patient is a 79 y.o. male presenting with hand injury. The history is provided by the patient and a relative.  Hand Injury Location:  Wrist Time since incident:  1 week Injury: no   Wrist location:  L wrist Pain details:    Quality:  Sharp   Radiates to:  Does not radiate   Severity:  Moderate   Onset quality:  Sudden   Progression:  Worsening Chronicity:  New Dislocation: no   Foreign body present:  No foreign bodies Prior injury to area:  No Associated symptoms: decreased range of motion   Associated symptoms: no fever     Past Medical History  Diagnosis Date  . BPH (benign prostatic hyperplasia)   . GERD (gastroesophageal reflux disease)   . Hyperlipidemia   . Fracture of femoral neck, left 02/17/2013   Past Surgical History  Procedure Laterality Date  . Colon surgery    . Percutaneous pinning Left 02/17/2013    Procedure: PERCUTANEOUS PINNING HIP;  Surgeon: Eulas Post, MD;  Location: [facility] OR;  Service: Orthopedics;  Laterality: Left;   History reviewed. No pertinent family history. History  Substance Use Topics  . Smoking status: Never Smoker   . Smokeless tobacco: Not on file  . Alcohol Use: No    Review of Systems  Constitutional: Negative.  Negative for fever.  Musculoskeletal: Positive for joint swelling.  Skin: Positive for color change and rash.    Allergies  Review of patient's allergies indicates no known allergies.  Home Medications   Current Outpatient Rx  Name  Route  Sig  Dispense  Refill  . bisacodyl (DULCOLAX) 10 MG suppository   Rectal   Place 1 suppository (10 mg total) rectally daily as needed.   12 suppository   0   . finasteride (PROSCAR) 5 MG tablet   Oral   Take 5 mg by mouth daily.         Marland Kitchen  HYDROcodone-acetaminophen (NORCO) 5-325 MG per tablet   Oral   Take 1-2 tablets by mouth every 6 (six) hours as needed for pain. MAXIMUM TOTAL ACETAMINOPHEN DOSE IS 4000 MG PER DAY   240 tablet   5   . predniSONE (DELTASONE) 10 MG tablet      3 tabs once a day for 2 days then 2 tabs daily for 2 days then 1 tab daily for 2 days. Start today.   12 tablet   0   . tamsulosin (FLOMAX) 0.4 MG CAPS   Oral   Take 0.4 mg by mouth daily.          BP 131/64  Pulse 79  Temp(Src) 97.6 F (36.4 C) (Oral)  Resp 16  SpO2 100% Physical Exam  Nursing note and vitals reviewed. Constitutional: He appears well-developed and well-nourished.  Musculoskeletal: He exhibits tenderness.       Left wrist: He exhibits decreased range of motion, tenderness, bony tenderness and swelling.       Arms: Neurological: He is alert.  Skin: Skin is warm and dry. There is erythema.    ED Course  Procedures (including critical care time) Labs Review Labs Reviewed - No data to display Imaging Review Dg Wrist Complete Left  05/06/2013   CLINICAL DATA:  Left wrist pain.  EXAM: LEFT WRIST - COMPLETE  3+ VIEW  COMPARISON:  None.  FINDINGS: Soft tissue swelling is present over the ulnar and dorsal aspect of the left wrist. Diffuse chondrocalcinosis is present. There is widening of the scapholunate joint. No acute osseous abnormality is evident.  IMPRESSION: 1. Diffuse chondrocalcinosis. This is most commonly seen in the setting of CPPD. 2. Diffuse soft tissue swelling it is most evident over the ulnar and dorsal aspects of the wrist. This could be posttraumatic or related to infection. Inflammatory arthropathy is considered less likely. 3. No acute osseous abnormality.   Electronically Signed   By: Gennette Pac M.D.   On: 05/06/2013 12:38      MDM  X-rays reviewed and report per radiologist.     Linna Hoff, MD 05/06/13 [phone]

## 2013-05-06 NOTE — ED Notes (Signed)
C/o left hand swelling unaware of how

## 2013-05-11 ENCOUNTER — Encounter: Payer: Self-pay | Admitting: Podiatry

## 2013-05-11 ENCOUNTER — Ambulatory Visit (INDEPENDENT_AMBULATORY_CARE_PROVIDER_SITE_OTHER): Payer: Medicare Other | Admitting: Podiatry

## 2013-05-11 VITALS — BP 143/86 | HR 80 | Resp 12 | Ht 68.0 in | Wt 147.0 lb

## 2013-05-11 DIAGNOSIS — B351 Tinea unguium: Secondary | ICD-10-CM

## 2013-05-11 DIAGNOSIS — Q828 Other specified congenital malformations of skin: Secondary | ICD-10-CM

## 2013-05-11 DIAGNOSIS — M79609 Pain in unspecified limb: Secondary | ICD-10-CM

## 2013-05-11 NOTE — Progress Notes (Signed)
Patient ID: [name], male   DOB: January 27, 1926, 79 y.o.   MRN: [id number]  Subjective: patient presents with niece complaining of painful toenails and keratoses.  Objective: Extremely hypertrophic he elongated toenails with texture and color changes x10. The nail plates are tender to palpation. Nucleated keratoses plantar right heel, plantar subsecond left MPJ. Keratoses noted second right toe.  Assessment: Symptomatic onychomycosis x10 Porokeratoses and keratoses x3  Plan: Nails x10 debrided back keratoses x3 debrided back without any bleeding. Reappoint intervals recommended in 3 months.  Ubah Radke C.Leeanne Deed, DPM

## 2013-05-12 ENCOUNTER — Other Ambulatory Visit: Payer: Self-pay

## 2013-05-29 ENCOUNTER — Other Ambulatory Visit: Payer: Self-pay | Admitting: Family Medicine

## 2013-05-29 MED ORDER — TAMSULOSIN HCL 0.4 MG PO CAPS: 0.4000 mg | ORAL_CAPSULE | Freq: Every day | ORAL | Status: DC

## 2013-05-29 NOTE — Telephone Encounter (Signed)
Rx Refilled  

## 2013-06-14 ENCOUNTER — Ambulatory Visit (INDEPENDENT_AMBULATORY_CARE_PROVIDER_SITE_OTHER): Payer: Medicare Other | Admitting: Family Medicine

## 2013-06-14 ENCOUNTER — Encounter: Payer: Self-pay | Admitting: Family Medicine

## 2013-06-14 VITALS — BP 130/80 | HR 78 | Temp 98.8°F | Resp 16

## 2013-06-14 DIAGNOSIS — M436 Torticollis: Secondary | ICD-10-CM

## 2013-06-14 MED ORDER — DIAZEPAM 5 MG PO TABS: 5.0000 mg | ORAL_TABLET | Freq: Two times a day (BID) | ORAL | Status: DC | PRN

## 2013-06-14 NOTE — Progress Notes (Signed)
Subjective:    Patient ID: [name], male    DOB: 1925-09-09, 79 y.o.   MRN: [id number]  HPI Patient presents today with his daughter. He is complaining of pain in the right lateral aspect of his neck for approximately one week. He awoke with pain in his neck. It began as a simple "crick in his neck" but has gradually worsened into the severe muscle spasm that is preventing him from bending or flexing his neck at all. He denies any neuropathy in the upper extremities. He denies any falls or injuries to the neck. His daughter has tried ibuprofen and warm compresses without relief. Past Medical History  Diagnosis Date  . BPH (benign prostatic hyperplasia)   . GERD (gastroesophageal reflux disease)   . Hyperlipidemia   . Fracture of femoral neck, left 02/17/2013   Current Outpatient Prescriptions on File Prior to Visit  Medication Sig Dispense Refill  . bisacodyl (DULCOLAX) 10 MG suppository Place 1 suppository (10 mg total) rectally daily as needed.  12 suppository  0  . finasteride (PROSCAR) 5 MG tablet Take 5 mg by mouth daily.      . tamsulosin (FLOMAX) 0.4 MG CAPS capsule Take 1 capsule (0.4 mg total) by mouth daily.  30 capsule  11   No current facility-administered medications on file prior to visit.   No Known Allergies History   Social History  . Marital Status: Married    Spouse Name: N/A    Number of Children: N/A  . Years of Education: N/A   Occupational History  . Not on file.   Social History Main Topics  . Smoking status: Never Smoker   . Smokeless tobacco: Not on file  . Alcohol Use: No  . Drug Use: No  . Sexual Activity: Not on file   Other Topics Concern  . Not on file   Social History Narrative  . No narrative on file      Review of Systems  All other systems reviewed and are negative.       Objective:   Physical Exam  Vitals reviewed. Constitutional: He is oriented to person, place, and time.  Cardiovascular: Normal rate, regular  rhythm and normal heart sounds.   No murmur heard. Pulmonary/Chest: Effort normal and breath sounds normal. No respiratory distress. He has no wheezes. He has no rales. He exhibits no tenderness.  Musculoskeletal:       Cervical back: He exhibits decreased range of motion, tenderness, pain and spasm. He exhibits no bony tenderness, no swelling, no edema and no deformity.  Neurological: He is alert and oriented to person, place, and time. He has normal reflexes. No cranial nerve deficit. He exhibits normal muscle tone.   patient has a very tender muscle spasm in the right trapezius. There is no tenderness to palpation over the cervical spinous processes. There is no evidence of any cervical radiculopathy.        Assessment & Plan:  1. Wry neck The patient has a muscle spasm in his neck. I recommended warm compresses 3 times a day. I recommended that he continue ibuprofen. I discussed the risk of muscle relaxers in his age range. Both he and his daughter or willing to try Valium due to the severity of his pain. Begin Valium 5 mg every 12 hours as needed for muscle spasms. The family was warned about sedation, falls, and altered mental status - diazepam (VALIUM) 5 MG tablet; Take 1 tablet (5 mg total) by mouth every  12 (twelve) hours as needed for anxiety or muscle spasms.  Dispense: 30 tablet; Refill: 0

## 2013-07-11 ENCOUNTER — Telehealth: Payer: Self-pay | Admitting: Family Medicine

## 2013-07-11 DIAGNOSIS — R2689 Other abnormalities of gait and mobility: Secondary | ICD-10-CM

## 2013-07-11 NOTE — Telephone Encounter (Signed)
ok 

## 2013-07-11 NOTE — Telephone Encounter (Signed)
Pt daughter is calling because she is wanting her father to have some type of assistance in the home Call back number is [phone]

## 2013-07-11 NOTE — Telephone Encounter (Signed)
??   Ok to do a Psychologist, clinicalreferrral

## 2013-07-13 NOTE — Telephone Encounter (Signed)
Referral done and pt's daughter aware per vm

## 2013-07-14 ENCOUNTER — Encounter: Payer: Self-pay | Admitting: Family Medicine

## 2013-07-14 NOTE — Telephone Encounter (Signed)
Eunice BlaseDebbie is calling from [place] and she is needing a discipline and a order for this pt Call back number is [phone]

## 2013-07-15 NOTE — Telephone Encounter (Signed)
This encounter was created in error - please disregard.

## 2013-08-08 ENCOUNTER — Ambulatory Visit: Payer: Medicare Other | Admitting: Podiatry

## 2013-08-17 ENCOUNTER — Ambulatory Visit (INDEPENDENT_AMBULATORY_CARE_PROVIDER_SITE_OTHER): Payer: Medicare Other | Admitting: Podiatry

## 2013-08-17 ENCOUNTER — Encounter: Payer: Self-pay | Admitting: Podiatry

## 2013-08-17 VITALS — BP 129/79 | HR 80 | Resp 12

## 2013-08-17 DIAGNOSIS — M79609 Pain in unspecified limb: Secondary | ICD-10-CM

## 2013-08-17 DIAGNOSIS — Q828 Other specified congenital malformations of skin: Secondary | ICD-10-CM

## 2013-08-17 DIAGNOSIS — B351 Tinea unguium: Secondary | ICD-10-CM

## 2013-08-18 NOTE — Progress Notes (Signed)
Patient ID: [name], male   DOB: March 01, 1926, 80 y.o.   MRN: [id number]  Subjective: patient presents  complaining of painful toenails and keratoses.   Objective: Extremely hypertrophic he elongated toenails with texture and color changes x10. The nail plates are tender to palpation. Nucleated keratoses plantar right heel, plantar subsecond left MPJ. Keratoses noted second right toe.   Assessment: Symptomatic onychomycosis x10  Porokeratoses and keratoses x3   Plan: Nails x10 debrided back keratoses x3 debrided back without any bleeding. Reappoint intervals recommended in 3 months.

## 2013-11-09 ENCOUNTER — Ambulatory Visit (INDEPENDENT_AMBULATORY_CARE_PROVIDER_SITE_OTHER): Payer: Medicare Other | Admitting: Podiatry

## 2013-11-09 ENCOUNTER — Encounter: Payer: Self-pay | Admitting: Podiatry

## 2013-11-09 VITALS — BP 143/96 | HR 69 | Resp 17 | Ht 67.0 in | Wt 160.0 lb

## 2013-11-09 DIAGNOSIS — M79609 Pain in unspecified limb: Secondary | ICD-10-CM

## 2013-11-09 DIAGNOSIS — B351 Tinea unguium: Secondary | ICD-10-CM

## 2013-11-09 DIAGNOSIS — Q828 Other specified congenital malformations of skin: Secondary | ICD-10-CM

## 2013-11-09 NOTE — Progress Notes (Signed)
   Subjective:    Patient ID: George SchaumannGordon Q Willis, male    DOB: 1925/11/19, 80 y.o.   MRN: [id number]  HPI Comments: Pt presents for debridement of keratosis, and toenails 1 - 10.  Pt states his 2nd toe on the right foot hurts and has painted the area with a red solution.     Review of Systems     Objective:   Physical Exam  Dermatological: Elongated, brittle, discolored, hypertrophic toenails x10 Nucleated keratoses plantar second and fifth MPJ plantar heel,right, and second MPJ left . Hammertoe second left with associated keratoses       Assessment & Plan:   Assessment: Symptomatic onychomycoses x10 Porokeratoses x4 Keratoses x1  Plan: Nails x10 and keratoses at porokeratoses x5 debrided without any bleeding. Reappoint at three-month intervals

## 2014-01-31 ENCOUNTER — Other Ambulatory Visit: Payer: Self-pay | Admitting: Family Medicine

## 2014-01-31 DIAGNOSIS — Z79899 Other long term (current) drug therapy: Secondary | ICD-10-CM

## 2014-01-31 DIAGNOSIS — Z0000 Encounter for general adult medical examination without abnormal findings: Secondary | ICD-10-CM

## 2014-01-31 DIAGNOSIS — E785 Hyperlipidemia, unspecified: Secondary | ICD-10-CM

## 2014-02-15 ENCOUNTER — Ambulatory Visit (INDEPENDENT_AMBULATORY_CARE_PROVIDER_SITE_OTHER): Payer: Medicare Other | Admitting: Podiatry

## 2014-02-15 ENCOUNTER — Encounter: Payer: Self-pay | Admitting: Podiatry

## 2014-02-15 VITALS — BP 140/77 | HR 78 | Resp 12

## 2014-02-15 DIAGNOSIS — B351 Tinea unguium: Secondary | ICD-10-CM

## 2014-02-15 DIAGNOSIS — M79609 Pain in unspecified limb: Secondary | ICD-10-CM

## 2014-02-15 DIAGNOSIS — M79673 Pain in unspecified foot: Secondary | ICD-10-CM

## 2014-02-15 DIAGNOSIS — Q828 Other specified congenital malformations of skin: Secondary | ICD-10-CM

## 2014-02-15 NOTE — Patient Instructions (Addendum)
Apply antibiotic ointment daily to the second right toe and cover with gauze or Band-Aid until healed

## 2014-02-16 ENCOUNTER — Encounter: Payer: Self-pay | Admitting: Family Medicine

## 2014-02-16 ENCOUNTER — Ambulatory Visit (INDEPENDENT_AMBULATORY_CARE_PROVIDER_SITE_OTHER): Payer: Medicare Other | Admitting: Family Medicine

## 2014-02-16 ENCOUNTER — Other Ambulatory Visit: Payer: Self-pay | Admitting: Family Medicine

## 2014-02-16 ENCOUNTER — Other Ambulatory Visit: Payer: Medicare Other

## 2014-02-16 VITALS — BP 128/72 | HR 76 | Temp 97.6°F | Resp 14 | Ht 67.5 in | Wt 138.0 lb

## 2014-02-16 DIAGNOSIS — L03113 Cellulitis of right upper limb: Secondary | ICD-10-CM

## 2014-02-16 DIAGNOSIS — Z79899 Other long term (current) drug therapy: Secondary | ICD-10-CM

## 2014-02-16 DIAGNOSIS — L03119 Cellulitis of unspecified part of limb: Secondary | ICD-10-CM

## 2014-02-16 DIAGNOSIS — L97512 Non-pressure chronic ulcer of other part of right foot with fat layer exposed: Secondary | ICD-10-CM

## 2014-02-16 DIAGNOSIS — L02519 Cutaneous abscess of unspecified hand: Secondary | ICD-10-CM

## 2014-02-16 DIAGNOSIS — E785 Hyperlipidemia, unspecified: Secondary | ICD-10-CM

## 2014-02-16 DIAGNOSIS — L97509 Non-pressure chronic ulcer of other part of unspecified foot with unspecified severity: Secondary | ICD-10-CM

## 2014-02-16 LAB — COMPLETE METABOLIC PANEL WITH GFR
ALK PHOS: 55 U/L (ref 39–117)
ALT: 11 U/L (ref 0–53)
AST: 16 U/L (ref 0–37)
Albumin: 3.4 g/dL — ABNORMAL LOW (ref 3.5–5.2)
BUN: 23 mg/dL (ref 6–23)
CO2: 25 mEq/L (ref 19–32)
CREATININE: 0.67 mg/dL (ref 0.50–1.35)
Calcium: 9 mg/dL (ref 8.4–10.5)
Chloride: 108 mEq/L (ref 96–112)
GFR, Est African American: >89 mL/min
GFR, Est Non African American: 87 mL/min
Glucose, Bld: 80 mg/dL (ref 70–99)
Potassium: 3.4 mEq/L — ABNORMAL LOW (ref 3.5–5.3)
Sodium: 141 mEq/L (ref 135–145)
Total Bilirubin: 0.8 mg/dL (ref 0.2–1.2)
Total Protein: 6.9 g/dL (ref 6.0–8.3)

## 2014-02-16 LAB — CBC WITH DIFFERENTIAL/PLATELET
Basophils Absolute: 0 10*3/uL (ref 0.0–0.1)
Basophils Relative: 0 % (ref 0–1)
Eosinophils Absolute: 0 10*3/uL (ref 0.0–0.7)
Eosinophils Relative: 0 % (ref 0–5)
HEMATOCRIT: 35.4 % — AB (ref 39.0–52.0)
HEMOGLOBIN: 12.3 g/dL — AB (ref 13.0–17.0)
LYMPHS ABS: 1.3 10*3/uL (ref 0.7–4.0)
Lymphocytes Relative: 21 % (ref 12–46)
MCH: 32.7 pg (ref 26.0–34.0)
MCHC: 34.7 g/dL (ref 30.0–36.0)
MCV: 94.1 fL (ref 78.0–100.0)
MONOS PCT: 8 % (ref 3–12)
Monocytes Absolute: 0.5 10*3/uL (ref 0.1–1.0)
NEUTROS PCT: 71 % (ref 43–77)
Neutro Abs: 4.5 10*3/uL (ref 1.7–7.7)
Platelets: 199 10*3/uL (ref 150–400)
RBC: 3.76 MIL/uL — AB (ref 4.22–5.81)
RDW: 13.3 % (ref 11.5–15.5)
WBC: 6.3 10*3/uL (ref 4.0–10.5)

## 2014-02-16 LAB — LIPID PANEL
CHOL/HDL RATIO: 3.2 ratio
CHOLESTEROL: 130 mg/dL (ref 0–200)
HDL: 41 mg/dL (ref >39)
LDL Cholesterol: 78 mg/dL (ref 0–99)
Triglycerides: 55 mg/dL (ref <150)
VLDL: 11 mg/dL (ref 0–40)

## 2014-02-16 MED ORDER — SULFAMETHOXAZOLE-TMP DS 800-160 MG PO TABS: 1.0000 | ORAL_TABLET | Freq: Two times a day (BID) | ORAL | Status: DC

## 2014-02-16 MED ORDER — POLYETHYLENE GLYCOL 3350 17 GM/SCOOP PO POWD: 17.0000 g | Freq: Every day | ORAL | Status: DC

## 2014-02-16 MED ORDER — TRAMADOL HCL 50 MG PO TABS: 50.0000 mg | ORAL_TABLET | Freq: Two times a day (BID) | ORAL | Status: DC | PRN

## 2014-02-16 NOTE — Patient Instructions (Signed)
Return on Monday for recheck Start antibiotics Miralax once a day for constipation

## 2014-02-16 NOTE — Progress Notes (Signed)
Patient ID: [name], male   DOB: 1926-02-28, 80 y.o.   MRN: [id number]  Subjective: This patient presents with his daughter complaining of painful toenails and painful keratoses  Objective: The toenails are elongated, hypertrophic, brittle, discolored 6-10 Nucleated plantar keratoses second and fifth MPJ and plantar heel right foot Nucleated keratoses plantar second MPJ left keratoses second right toe, cutaneous horn  Assessment: Symptomatic onychomycoses 6-10 Porokeratosis x4 After debridement keratoses second right toe granular base noted, pre-ulcerative  Plan: Nails x10 and Porro x4 debrided without any bleeding  Debrided keratoses second right toe and apply antibiotic ointment  Patient was instructed to continue apply antibiotic ointment to the second right toe  Reappoint x3 months

## 2014-02-16 NOTE — Progress Notes (Signed)
Patient ID: [name] Pursley, male   DOB: 07-11-1925, 80 y.o.   MRN: [id number]   Subjective:    Patient ID: [name] Shankel, male    DOB: 07-11-1925, 80 y.o.   MRN: [id number]  Patient presents for R Hand Swollen and Fasting Labs [facility] Pt here with son in law, unfortunately neither are good historians, ? If he was bitten by an insect of some sort this past weekend, since Saturday he has had swelling of his right hand, with throbbing pain.   Constipation- bowels hard to move, he takes stool softener with no improvement, no blood in stools     Review Of Systems:  GEN- denies fatigue, fever, weight loss,weakness, recent illness HEENT- denies eye drainage, change in vision, nasal discharge, CVS- denies chest pain, palpitations RESP- denies SOB, cough, wheeze ABD- denies N/V, +change in stools, abd pain GU- denies dysuria, hematuria, dribbling, incontinence MSK- + joint pain, muscle aches, injury Neuro- denies headache, dizziness, syncope, seizure activity       Objective:    BP 128/72  Pulse 76  Temp(Src) 97.6 F (36.4 C) (Oral)  Resp 14  Ht 5' 7.5" (1.715 m)  Wt 138 lb (62.596 kg)  BMI 21.28 kg/m2 GEN- NAD, alert and oriented x3 HEENT- PERRL, EOMI, non injected sclera, pink conjunctiva, MMM, oropharynx clear Neck- Supple, no LAD, fair ROM CVS- RRR, no murmur RESP-CTAB ABD-NABS,soft,NT,ND EXT- Right hand - swelling to wrist, with erythema and warmth, no induration, no specific bite mark or entry point noted Right foot 2nd toe- shallow based ulcer with foul odor, mild drainage, NT, hyperpigmented discoloration of toe Pulses- Radial 2+, DP-1+        Assessment & Plan:      Problem List Items Addressed This Visit   None    Visit Diagnoses   Cellulitis of hand, right    -  Primary    treat with bactrim, left messge  with pt daughter who is caregiver with red flags, instructions, f/u Monday for recheck    Right second toe ulcer, with fat layer exposed         followed by podiatry, reviewed notes, seen yesterday    HLD (hyperlipidemia)        Encounter for long-term (current) use of other medications           Note: This dictation was prepared with Dragon dictation along with smaller phrase technology. Any transcriptional errors that result from this process are unintentional.

## 2014-02-20 ENCOUNTER — Encounter: Payer: Self-pay | Admitting: Family Medicine

## 2014-02-20 ENCOUNTER — Ambulatory Visit (INDEPENDENT_AMBULATORY_CARE_PROVIDER_SITE_OTHER): Payer: Medicare Other | Admitting: Family Medicine

## 2014-02-20 VITALS — BP 130/72 | HR 72 | Temp 97.8°F | Resp 14 | Ht 67.5 in | Wt 138.0 lb

## 2014-02-20 DIAGNOSIS — L03119 Cellulitis of unspecified part of limb: Secondary | ICD-10-CM

## 2014-02-20 DIAGNOSIS — L03113 Cellulitis of right upper limb: Secondary | ICD-10-CM

## 2014-02-20 DIAGNOSIS — L97512 Non-pressure chronic ulcer of other part of right foot with fat layer exposed: Secondary | ICD-10-CM | POA: Insufficient documentation

## 2014-02-20 DIAGNOSIS — L97509 Non-pressure chronic ulcer of other part of unspecified foot with unspecified severity: Secondary | ICD-10-CM

## 2014-02-20 DIAGNOSIS — L02519 Cutaneous abscess of unspecified hand: Secondary | ICD-10-CM

## 2014-02-20 MED ORDER — SULFAMETHOXAZOLE-TMP DS 800-160 MG PO TABS: 1.0000 | ORAL_TABLET | Freq: Two times a day (BID) | ORAL | Status: DC

## 2014-02-20 NOTE — Progress Notes (Signed)
Patient ID: George SchaumannGordon Q Magana, male   DOB: 04-04-1926, 80 y.o.   MRN: [id number]   Subjective:    Patient ID: [name], male    DOB: 04-04-1926, 80 y.o.   MRN: [id number]  Patient presents for Cellulitis Pt here to f/u cellulitis, seen last Thursday with cellulitis of right hand and forearm, redness and swelling significantly improved, pain controlled with ultram, He is here today with his son's wife, who is a CMA.  He is also being followed for his right toe ulcer by podiatry Will complete day 5 of bactrim today. No fever Labs reviewed WBC normal. Has CPE on Thursday    Review Of Systems:  GEN- denies fatigue, fever, weight loss,weakness, recent illness HEENT- denies eye drainage, change in vision, nasal discharge, CVS- denies chest pain, palpitations RESP- denies SOB, cough, wheeze Neuro- denies headache, dizziness, syncope, seizure activity       Objective:    BP 130/72  Pulse 72  Temp(Src) 97.8 F (36.6 C) (Oral)  Resp 14  Ht 5' 7.5" (1.715 m)  Wt 138 lb (62.596 kg)  BMI 21.28 kg/m2 GEN- NAD, alert and oriented x3 EXT- Right hand - minimal  swelling to wrist, hand /arm NT improved erythema , no  warmth, no induration, good ROM wrist, able to make fist Right foot 2nd toe- shallow based ulcer no  drainage, NT, hyperpigmented discoloration of toe Pulses- Radial 2+, DP-1+       Assessment & Plan:      Problem List Items Addressed This Visit   None      Note: This dictation was prepared with Dragon dictation along with smaller phrase technology. Any transcriptional errors that result from this process are unintentional.

## 2014-02-20 NOTE — Assessment & Plan Note (Signed)
Significantly Improved cellulitis , will extend antibiotics for total of 10 days He will f/u Thursday as scheduled for his CPE and this can be rechecked,

## 2014-02-20 NOTE — Assessment & Plan Note (Signed)
Continue wound routine, Topical antibiotic, followed by podiatry

## 2014-02-20 NOTE — Patient Instructions (Signed)
Take another course of antibiotics- total of 10 days  Ultram for pain Hand looks much better! F/U as needed

## 2014-02-21 ENCOUNTER — Ambulatory Visit: Payer: Medicare Other | Admitting: Family Medicine

## 2014-02-23 ENCOUNTER — Encounter: Payer: Self-pay | Admitting: Family Medicine

## 2014-02-23 ENCOUNTER — Ambulatory Visit (INDEPENDENT_AMBULATORY_CARE_PROVIDER_SITE_OTHER): Payer: Medicare Other | Admitting: Family Medicine

## 2014-02-23 VITALS — BP 130/74 | HR 74 | Temp 97.7°F | Resp 16 | Ht 67.5 in | Wt 136.0 lb

## 2014-02-23 DIAGNOSIS — R002 Palpitations: Secondary | ICD-10-CM

## 2014-02-23 DIAGNOSIS — Z23 Encounter for immunization: Secondary | ICD-10-CM

## 2014-02-23 DIAGNOSIS — Z0000 Encounter for general adult medical examination without abnormal findings: Secondary | ICD-10-CM

## 2014-02-23 NOTE — Addendum Note (Signed)
Addended by: Legrand RamsWILLIS, SANDY B on: 02/23/2014 09:55 AM   Modules accepted: Orders

## 2014-02-23 NOTE — Progress Notes (Signed)
Subjective:    Patient ID: [name], male    DOB: Oct 22, 1925, 80 y.o.   MRN: [id number]  HPI Patient is here today for complete physical exam. He states that he is dealing with polyuria. When he goes to the restroom however he dribbles. He has a very weak urinary stream. He has to eat frequently throughout the day. He denies any dysuria or hematuria. I have the patient on Flomax and finasteride for BPH.  These do not seem to be helping with the patient's symptoms. On examination today his prostate is +2 in size but I do not feel any nodules or irregularity. Certainly overactive bladder is also a possibility. Also on examination today he has an irregular heartbeat. This is asymptomatic. Patient is due for Prevnar 13. Otherwise his vaccinations are up-to-date. He is also due for colonoscopy. Office Visit on 02/16/2014  Component Date Value Ref Range Status  . WBC 02/16/2014 6.3  4.0 - 10.5 K/uL Final  . RBC 02/16/2014 3.76* 4.22 - 5.81 MIL/uL Final  . Hemoglobin 02/16/2014 12.3* 13.0 - 17.0 g/dL Final  . HCT 02/16/2014 35.4* 39.0 - 52.0 % Final  . MCV 02/16/2014 94.1  78.0 - 100.0 fL Final  . MCH 02/16/2014 32.7  26.0 - 34.0 pg Final  . MCHC 02/16/2014 34.7  30.0 - 36.0 g/dL Final  . RDW 02/16/2014 13.3  11.5 - 15.5 % Final  . Platelets 02/16/2014 199  150 - 400 K/uL Final  . Neutrophils Relative % 02/16/2014 71  43 - 77 % Final  . Neutro Abs 02/16/2014 4.5  1.7 - 7.7 K/uL Final  . Lymphocytes Relative 02/16/2014 21  12 - 46 % Final  . Lymphs Abs 02/16/2014 1.3  0.7 - 4.0 K/uL Final  . Monocytes Relative 02/16/2014 8  3 - 12 % Final  . Monocytes Absolute 02/16/2014 0.5  0.1 - 1.0 K/uL Final  . Eosinophils Relative 02/16/2014 0  0 - 5 % Final  . Eosinophils Absolute 02/16/2014 0.0  0.0 - 0.7 K/uL Final  . Basophils Relative 02/16/2014 0  0 - 1 % Final  . Basophils Absolute 02/16/2014 0.0  0.0 - 0.1 K/uL Final  . Smear Review 02/16/2014 Criteria for review not met   Final  .  Cholesterol 02/16/2014 130  0 - 200 mg/dL Final   Comment: ATP III Classification:                                < 200        mg/dL        Desirable                               200 - 239     mg/dL        Borderline High                               >= 240        mg/dL        High                             . Triglycerides 02/16/2014 55  <150 mg/dL Final  . HDL 02/16/2014 41  >39 mg/dL Final  . Total CHOL/HDL Ratio 02/16/2014  3.2   Final  . VLDL 02/16/2014 11  0 - 40 mg/dL Final  . LDL Cholesterol 02/16/2014 78  0 - 99 mg/dL Final   Comment:                            Total Cholesterol/HDL Ratio:CHD Risk                                                 Coronary Heart Disease Risk Table                                                                 Men       Women                                   1/2 Average Risk              3.4        3.3                                       Average Risk              5.0        4.4                                    2X Average Risk              9.6        7.1                                    3X Average Risk             23.4       11.0                          Use the calculated Patient Ratio above and the CHD Risk table                           to determine the patient's CHD Risk.                          ATP III Classification (LDL):                                < 100        mg/dL         Optimal  100 - 129     mg/dL         Near or Above Optimal                               130 - 159     mg/dL         Borderline High                               160 - 189     mg/dL         High                                > 190        mg/dL         Very High                             . Sodium 02/16/2014 141  135 - 145 mEq/L Final  . Potassium 02/16/2014 3.4* 3.5 - 5.3 mEq/L Final  . Chloride 02/16/2014 108  96 - 112 mEq/L Final  . CO2 02/16/2014 25  19 - 32 mEq/L Final  . Glucose, Bld 02/16/2014 80  70 - 99 mg/dL Final   . BUN 02/16/2014 23  6 - 23 mg/dL Final  . Creat 02/16/2014 0.67  0.50 - 1.35 mg/dL Final  . Total Bilirubin 02/16/2014 0.8  0.2 - 1.2 mg/dL Final  . Alkaline Phosphatase 02/16/2014 55  39 - 117 U/L Final  . AST 02/16/2014 16  0 - 37 U/L Final  . ALT 02/16/2014 11  0 - 53 U/L Final  . Total Protein 02/16/2014 6.9  6.0 - 8.3 g/dL Final  . Albumin 02/16/2014 3.4* 3.5 - 5.2 g/dL Final  . Calcium 02/16/2014 9.0  8.4 - 10.5 mg/dL Final  . GFR, Est African American 02/16/2014 >89   Final  . GFR, Est Non African American 02/16/2014 87   Final   Comment:                            The estimated GFR is a calculation valid for adults (>=42 years old)                          that uses the CKD-EPI algorithm to adjust for age and sex. It is                            not to be used for children, pregnant women, hospitalized patients,                             patients on dialysis, or with rapidly changing kidney function.                          According to the NKDEP, eGFR >89 is normal, 60-89 shows mild                          impairment, 30-59 shows moderate impairment, 15-29 shows severe  impairment and <15 is ESRD.                              Past Medical History  Diagnosis Date  . BPH (benign prostatic hyperplasia)   . GERD (gastroesophageal reflux disease)   . Hyperlipidemia   . Fracture of femoral neck, left 02/17/2013   Past Surgical History  Procedure Laterality Date  . Colon surgery    . Percutaneous pinning Left 02/17/2013    Procedure: PERCUTANEOUS PINNING HIP;  Surgeon: Johnny Bridge, MD;  Location: [facility];  Service: Orthopedics;  Laterality: Left;   History   Social History  . Marital Status: Married    Spouse Name: N/A    Number of Children: N/A  . Years of Education: N/A   Occupational History  . Not on file.   Social History Main Topics  . Smoking status: Never Smoker   . Smokeless tobacco: Not on file  . Alcohol Use: No  . Drug  Use: No  . Sexual Activity: Not on file   Other Topics Concern  . Not on file   Social History Narrative  . No narrative on file   No family history on file. Current Outpatient Prescriptions on File Prior to Visit  Medication Sig Dispense Refill  . finasteride (PROSCAR) 5 MG tablet Take 5 mg by mouth daily.      . polyethylene glycol powder (GLYCOLAX/MIRALAX) powder Take 17 g by mouth daily.  3350 g  3  . tamsulosin (FLOMAX) 0.4 MG CAPS capsule Take 1 capsule (0.4 mg total) by mouth daily.  30 capsule  11  . traMADol (ULTRAM) 50 MG tablet Take 1 tablet (50 mg total) by mouth 2 (two) times daily as needed.  30 tablet  1   No current facility-administered medications on file prior to visit.   No Known Allergies    Review of Systems  All other systems reviewed and are negative.      Objective:   Physical Exam  Vitals reviewed. Constitutional: He is oriented to person, place, and time. He appears well-developed and well-nourished. No distress.  HENT:  Head: Normocephalic and atraumatic.  Right Ear: External ear normal.  Left Ear: External ear normal.  Nose: Nose normal.  Mouth/Throat: Oropharynx is clear and moist. No oropharyngeal exudate.  Eyes: Conjunctivae and EOM are normal. Pupils are equal, round, and reactive to light. Right eye exhibits no discharge. Left eye exhibits no discharge. No scleral icterus.  Neck: Normal range of motion. Neck supple. No JVD present. No tracheal deviation present. No thyromegaly present.  Cardiovascular: Normal rate, normal heart sounds and intact distal pulses.  An irregularly irregular rhythm present. Exam reveals no gallop and no friction rub.   No murmur heard. Pulmonary/Chest: Effort normal and breath sounds normal. No stridor. No respiratory distress. He has no wheezes. He has no rales. He exhibits no tenderness.  Abdominal: Soft. Bowel sounds are normal. He exhibits no distension and no mass. There is no tenderness. There is no rebound  and no guarding.  Genitourinary: Rectum normal. Prostate is enlarged. Prostate is not tender.  Musculoskeletal: Normal range of motion. He exhibits no edema and no tenderness.  Lymphadenopathy:    He has no cervical adenopathy.  Neurological: He is alert and oriented to person, place, and time. He has normal reflexes. He displays normal reflexes. No cranial nerve deficit. He exhibits normal muscle tone. Coordination normal.  Skin: Skin is warm.  No rash noted. He is not diaphoretic. No erythema. No pallor.  Psychiatric: He has a normal mood and affect. His behavior is normal. Judgment and thought content normal.          Assessment & Plan:  Routine general medical examination at a health care facility  Palpitations  Patient received Prevnar 13 today in the office. I recommended that we not proceed with colonoscopy given his age. Patient's prostate exam is significant BPH. However the prostate is not markedly enlarged. I will check a PSA. His PSA is low normal, consider empiric therapy for overactive bladder. A PSA is high normal R. elevated, consider urology consultation for possible TURP.  Patient has palpitations today on examination. I will obtain an EKG.  Patient does complain of problems performing ADLs due to immobility. However physical therapy has worked with the patient at home. He is not ready to go to a nursing home. Patient's family feel that they can care for him at home. Patient denies any depression.  EKG today confirms normal sinus rhythm with frequent PVCs. There is no evidence of atrial fibrillation.

## 2014-02-24 LAB — PSA: PSA: 0.92 ng/mL (ref <=4.00)

## 2014-02-27 ENCOUNTER — Other Ambulatory Visit: Payer: Self-pay | Admitting: *Deleted

## 2014-02-27 MED ORDER — SOLIFENACIN SUCCINATE 10 MG PO TABS: 10.0000 mg | ORAL_TABLET | Freq: Every day | ORAL | Status: DC

## 2014-03-08 ENCOUNTER — Emergency Department (HOSPITAL_COMMUNITY): Payer: Medicare Other

## 2014-03-08 ENCOUNTER — Inpatient Hospital Stay (HOSPITAL_COMMUNITY)
Admission: EM | Admit: 2014-03-08 | Discharge: 2014-03-14 | DRG: 871 | Disposition: A | Discharge status: Home Health | Payer: Medicare Other | Attending: Internal Medicine | Admitting: Internal Medicine

## 2014-03-08 ENCOUNTER — Encounter (HOSPITAL_COMMUNITY): Payer: Self-pay | Admitting: Emergency Medicine

## 2014-03-08 DIAGNOSIS — N400 Enlarged prostate without lower urinary tract symptoms: Secondary | ICD-10-CM | POA: Diagnosis present

## 2014-03-08 DIAGNOSIS — M064 Inflammatory polyarthropathy: Secondary | ICD-10-CM | POA: Diagnosis present

## 2014-03-08 DIAGNOSIS — D649 Anemia, unspecified: Secondary | ICD-10-CM | POA: Diagnosis present

## 2014-03-08 DIAGNOSIS — N390 Urinary tract infection, site not specified: Secondary | ICD-10-CM | POA: Diagnosis present

## 2014-03-08 DIAGNOSIS — IMO0002 Reserved for concepts with insufficient information to code with codable children: Secondary | ICD-10-CM | POA: Diagnosis not present

## 2014-03-08 DIAGNOSIS — E785 Hyperlipidemia, unspecified: Secondary | ICD-10-CM | POA: Diagnosis present

## 2014-03-08 DIAGNOSIS — E876 Hypokalemia: Secondary | ICD-10-CM | POA: Diagnosis present

## 2014-03-08 DIAGNOSIS — Z66 Do not resuscitate: Secondary | ICD-10-CM | POA: Diagnosis present

## 2014-03-08 DIAGNOSIS — E870 Hyperosmolality and hypernatremia: Secondary | ICD-10-CM | POA: Diagnosis not present

## 2014-03-08 DIAGNOSIS — T502X5A Adverse effect of carbonic-anhydrase inhibitors, benzothiadiazides and other diuretics, initial encounter: Secondary | ICD-10-CM | POA: Diagnosis present

## 2014-03-08 DIAGNOSIS — I5032 Chronic diastolic (congestive) heart failure: Secondary | ICD-10-CM

## 2014-03-08 DIAGNOSIS — A419 Sepsis, unspecified organism: Secondary | ICD-10-CM | POA: Diagnosis present

## 2014-03-08 DIAGNOSIS — K219 Gastro-esophageal reflux disease without esophagitis: Secondary | ICD-10-CM | POA: Diagnosis present

## 2014-03-08 DIAGNOSIS — R509 Fever, unspecified: Secondary | ICD-10-CM | POA: Diagnosis present

## 2014-03-08 DIAGNOSIS — I509 Heart failure, unspecified: Secondary | ICD-10-CM | POA: Diagnosis present

## 2014-03-08 DIAGNOSIS — E43 Unspecified severe protein-calorie malnutrition: Secondary | ICD-10-CM | POA: Diagnosis present

## 2014-03-08 DIAGNOSIS — I5033 Acute on chronic diastolic (congestive) heart failure: Secondary | ICD-10-CM | POA: Diagnosis present

## 2014-03-08 DIAGNOSIS — R7881 Bacteremia: Secondary | ICD-10-CM | POA: Diagnosis present

## 2014-03-08 DIAGNOSIS — L02519 Cutaneous abscess of unspecified hand: Secondary | ICD-10-CM

## 2014-03-08 DIAGNOSIS — R1310 Dysphagia, unspecified: Secondary | ICD-10-CM

## 2014-03-08 DIAGNOSIS — Z8249 Family history of ischemic heart disease and other diseases of the circulatory system: Secondary | ICD-10-CM

## 2014-03-08 DIAGNOSIS — L03119 Cellulitis of unspecified part of limb: Secondary | ICD-10-CM

## 2014-03-08 DIAGNOSIS — L03113 Cellulitis of right upper limb: Secondary | ICD-10-CM | POA: Diagnosis present

## 2014-03-08 DIAGNOSIS — D539 Nutritional anemia, unspecified: Secondary | ICD-10-CM | POA: Diagnosis present

## 2014-03-08 DIAGNOSIS — M1990 Unspecified osteoarthritis, unspecified site: Secondary | ICD-10-CM | POA: Diagnosis present

## 2014-03-08 LAB — TROPONIN I: Troponin I: <0.3 ng/mL (ref <0.30)

## 2014-03-08 LAB — COMPREHENSIVE METABOLIC PANEL
ALK PHOS: 69 U/L (ref 39–117)
ALT: 14 U/L (ref 0–53)
AST: 42 U/L — AB (ref 0–37)
Albumin: 3.2 g/dL — ABNORMAL LOW (ref 3.5–5.2)
Anion gap: 15 (ref 5–15)
BUN: 17 mg/dL (ref 6–23)
CO2: 22 mEq/L (ref 19–32)
Calcium: 8.9 mg/dL (ref 8.4–10.5)
Chloride: 103 mEq/L (ref 96–112)
Creatinine, Ser: 0.78 mg/dL (ref 0.50–1.35)
GFR calc Af Amer: >90 mL/min (ref >90)
GFR calc non Af Amer: 79 mL/min — ABNORMAL LOW (ref >90)
GLUCOSE: 100 mg/dL — AB (ref 70–99)
POTASSIUM: 3.5 meq/L — AB (ref 3.7–5.3)
Sodium: 140 mEq/L (ref 137–147)
TOTAL PROTEIN: 7.2 g/dL (ref 6.0–8.3)
Total Bilirubin: 1 mg/dL (ref 0.3–1.2)

## 2014-03-08 LAB — RETICULOCYTES
RBC.: 3.69 MIL/uL — ABNORMAL LOW (ref 4.22–5.81)
Retic Count, Absolute: 73.8 10*3/uL (ref 19.0–186.0)
Retic Ct Pct: 2 % (ref 0.4–3.1)

## 2014-03-08 LAB — CBC WITH DIFFERENTIAL/PLATELET
BASOS PCT: 0 % (ref 0–1)
Basophils Absolute: 0 10*3/uL (ref 0.0–0.1)
Eosinophils Absolute: 0 10*3/uL (ref 0.0–0.7)
Eosinophils Relative: 0 % (ref 0–5)
HCT: 25.6 % — ABNORMAL LOW (ref 39.0–52.0)
Hemoglobin: 8.8 g/dL — ABNORMAL LOW (ref 13.0–17.0)
LYMPHS ABS: 0.8 10*3/uL (ref 0.7–4.0)
Lymphocytes Relative: 9 % — ABNORMAL LOW (ref 12–46)
MCH: 32.8 pg (ref 26.0–34.0)
MCHC: 34.4 g/dL (ref 30.0–36.0)
MCV: 95.5 fL (ref 78.0–100.0)
Monocytes Absolute: 1.1 10*3/uL — ABNORMAL HIGH (ref 0.1–1.0)
Monocytes Relative: 12 % (ref 3–12)
NEUTROS PCT: 79 % — AB (ref 43–77)
Neutro Abs: 7.4 10*3/uL (ref 1.7–7.7)
PLATELETS: 203 10*3/uL (ref 150–400)
RBC: 2.68 MIL/uL — AB (ref 4.22–5.81)
RDW: 13.4 % (ref 11.5–15.5)
WBC: 9.3 10*3/uL (ref 4.0–10.5)

## 2014-03-08 LAB — URINALYSIS, ROUTINE W REFLEX MICROSCOPIC
Bilirubin Urine: NEGATIVE
GLUCOSE, UA: NEGATIVE mg/dL
Ketones, ur: NEGATIVE mg/dL
Nitrite: NEGATIVE
Protein, ur: 100 mg/dL — AB
SPECIFIC GRAVITY, URINE: 1.018 (ref 1.005–1.030)
Urobilinogen, UA: 1 mg/dL (ref 0.0–1.0)
pH: 6 (ref 5.0–8.0)

## 2014-03-08 LAB — URINE MICROSCOPIC-ADD ON

## 2014-03-08 LAB — POC OCCULT BLOOD, ED: FECAL OCCULT BLD: NEGATIVE

## 2014-03-08 LAB — TYPE AND SCREEN
ABO/RH(D): B POS
Antibody Screen: NEGATIVE

## 2014-03-08 LAB — LACTIC ACID, PLASMA: Lactic Acid, Venous: 1.4 mmol/L (ref 0.5–2.2)

## 2014-03-08 MED ORDER — PIPERACILLIN-TAZOBACTAM 3.375 G IVPB
3.3750 g | Freq: Three times a day (TID) | INTRAVENOUS | Status: DC
  Administered 2014-03-09 – 2014-03-10 (×4): 3.375 g via INTRAVENOUS
  Filled 2014-03-08 (×9): qty 50

## 2014-03-08 MED ORDER — VANCOMYCIN HCL IN DEXTROSE 1-5 GM/200ML-% IV SOLN
1000.0000 mg | INTRAVENOUS | Status: AC
  Administered 2014-03-08: 1000 mg via INTRAVENOUS
  Filled 2014-03-08: qty 200

## 2014-03-08 MED ORDER — COLCHICINE 0.6 MG PO TABS
0.6000 mg | ORAL_TABLET | Freq: Two times a day (BID) | ORAL | Status: DC
  Administered 2014-03-09 (×2): 0.6 mg via ORAL
  Filled 2014-03-08 (×7): qty 1

## 2014-03-08 MED ORDER — POTASSIUM CHLORIDE 10 MEQ/100ML IV SOLN
10.0000 meq | INTRAVENOUS | Status: AC
  Administered 2014-03-09 (×2): 10 meq via INTRAVENOUS
  Filled 2014-03-08 (×2): qty 100

## 2014-03-08 MED ORDER — ACETAMINOPHEN 650 MG RE SUPP
650.0000 mg | RECTAL | Status: DC | PRN
Start: 2014-03-08 — End: 2014-03-08

## 2014-03-08 MED ORDER — PIPERACILLIN-TAZOBACTAM 3.375 G IVPB 30 MIN
3.3750 g | INTRAVENOUS | Status: AC
  Administered 2014-03-08: 3.375 g via INTRAVENOUS
  Filled 2014-03-08: qty 50

## 2014-03-08 MED ORDER — SODIUM CHLORIDE 0.9 % IV BOLUS (SEPSIS)
1000.0000 mL | Freq: Once | INTRAVENOUS | Status: AC
  Administered 2014-03-08: 1000 mL via INTRAVENOUS

## 2014-03-08 MED ORDER — VANCOMYCIN HCL 500 MG IV SOLR
500.0000 mg | Freq: Two times a day (BID) | INTRAVENOUS | Status: DC
  Administered 2014-03-09 – 2014-03-10 (×3): 500 mg via INTRAVENOUS
  Filled 2014-03-08 (×4): qty 500

## 2014-03-08 NOTE — Progress Notes (Signed)
ANTIBIOTIC CONSULT NOTE - INITIAL  Pharmacy Consult for Vancomycin / Zosyn Indication: Sepsis  No Known Allergies  Patient Measurements: Height: 6' (182.9 cm) Weight: 140 lb (63.504 kg) IBW/kg (Calculated) : 77.6 Adjusted Body Weight:   Vital Signs: Temp: 100 F (37.8 C) (09/02 1736) Temp src: Rectal (09/02 1736) BP: 149/84 mmHg (09/02 1900) Pulse Rate: 91 (09/02 1900) Intake/Output from previous day:   Intake/Output from this shift:    Labs:  Recent Labs  03/08/14 1759  WBC 9.3  HGB 8.8*  PLT 203  CREATININE 0.78   Estimated Creatinine Clearance: 59.5 ml/min (by C-G formula based on Cr of 0.78). No results found for this basename: VANCOTROUGH, VANCOPEAK, VANCORANDOM, GENTTROUGH, GENTPEAK, GENTRANDOM, TOBRATROUGH, TOBRAPEAK, TOBRARND, AMIKACINPEAK, AMIKACINTROU, AMIKACIN,  in the last 72 hours   Microbiology: No results found for this or any previous visit (from the past 720 hour(s)).  Medical History: Past Medical History  Diagnosis Date  . BPH (benign prostatic hyperplasia)   . GERD (gastroesophageal reflux disease)   . Hyperlipidemia   . Fracture of femoral neck, left 02/17/2013   Assessment: 88 yoM presents to [facility] with CC of fever and fall.  PMHx includes BPH, GERD, HLD, femoral neck surgery, and recent antibiotic course of bactrim for hand cellulitis. CT of head and cervical spine negative for abnormalities or acute fracture.  Pharmacy consulted to dose IV vancomycin and zosyn for empiric sepsis.    9/2 >> Vancomycin  >> 9/2 >> Zosyn  >>    Tmax: 100 WBCs: WNL Renal: WNL, CrCl 60 (N68)  9/2 blood: collected 9/2 urine: collected   Goal of Therapy:  Vancomycin trough level 15-20 mcg/ml Eradication of infection  Plan:  Zosyn 3.375g IV q8h (infuse over 4 hours) Vancomycin 1g x1, then  IV q12h F/u renal fxn, cultures, clinical course  Haynes Hoehn, PharmD, BCPS 03/08/2014, 8:29 PM  Pager: [phone]

## 2014-03-08 NOTE — H&P (Signed)
PCP:  Leo Grosser, MD    Chief Complaint:  Was found down  HPI: [name] is a 80 y.o. male   has a past medical history of BPH (benign prostatic hyperplasia); GERD (gastroesophageal reflux disease); Hyperlipidemia; and Fracture of femoral neck, left (02/17/2013).   Presented with  Patient was found down on the the floor. The caretaker was out at the time. He was noted to be somewhat more somnolent. On arrival he was tachycrdic up to 140's Temperature up to 100. 2 weeks ago he was treated with bactrim for possible cellulitis of right wrist. He took 3 doses and got better but became swollen again today. No Hx of gout.  Plain films wee obtain suggestive of CPPD. In ER patietn was started on Zosyn and Vanc given sepsis  Hospitalist was called for admission for Cellulitis vs CPPD, UTI, sepsis  Review of Systems:    Pertinent positives include: confusion, urgency or frequency, weight loss   Constitutional:  No weight loss, night sweats, Fevers, chills, fatigue,  HEENT:  No headaches, Difficulty swallowing,Tooth/dental problems,Sore throat,  No sneezing, itching, ear ache, nasal congestion, post nasal drip,  Cardio-vascular:  No chest pain, Orthopnea, PND, anasarca, dizziness, palpitations.no Bilateral lower extremity swelling  GI:  No heartburn, indigestion, abdominal pain, nausea, vomiting, diarrhea, change in bowel habits, loss of appetite, melena, blood in stool, hematemesis Resp:  no shortness of breath at rest. No dyspnea on exertion, No excess mucus, no productive cough, No non-productive cough, No coughing up of blood.No change in color of mucus.No wheezing. Skin:  no rash or lesions. No jaundice GU:  no dysuria, change in color of urine, no . No straining to urinate.  No flank pain.  Musculoskeletal:  No joint pain or no joint swelling. No decreased range of motion. No back pain.  Psych:  No change in mood or affect. No depression or anxiety. No memory loss.    Neuro: no localizing neurological complaints, no tingling, no weakness, no double vision, no gait abnormality, no slurred speech,   Otherwise ROS are negative except for above, 10 systems were reviewed  Past Medical History: Past Medical History  Diagnosis Date  . BPH (benign prostatic hyperplasia)   . GERD (gastroesophageal reflux disease)   . Hyperlipidemia   . Fracture of femoral neck, left 02/17/2013   Past Surgical History  Procedure Laterality Date  . Colon surgery    . Percutaneous pinning Left 02/17/2013    Procedure: PERCUTANEOUS PINNING HIP;  Surgeon: Eulas Post, MD;  Location: [facility];  Service: Orthopedics;  Laterality: Left;     Medications: Prior to Admission medications   Medication Sig Start Date End Date Taking? Authorizing Provider  polyethylene glycol powder (GLYCOLAX/MIRALAX) powder Take 17 g by mouth daily as needed for mild constipation. 02/16/14  Yes Salley Scarlet, MD  tamsulosin (FLOMAX) 0.4 MG CAPS capsule Take 1 capsule (0.4 mg total) by mouth daily. 05/29/13  Yes Donita Brooks, MD  traMADol (ULTRAM) 50 MG tablet Take 1 tablet (50 mg total) by mouth 2 (two) times daily as needed. 02/16/14  Yes Salley Scarlet, MD  solifenacin (VESICARE) 10 MG tablet Take 1 tablet (10 mg total) by mouth daily. 02/27/14   Donita Brooks, MD    Allergies:  No Known Allergies  Social History:  Ambulatory  walker   Lives at home   With family and 24/7 care     reports that he has never smoked. He does not have  any smokeless tobacco history on file. He reports that he does not drink alcohol or use illicit drugs.    Family History: family history includes Hypertension in his sister; Migraines in his sister.    Physical Exam: Patient Vitals for the past 24 hrs:  BP Temp Temp src Pulse Resp SpO2 Height Weight  03/08/14 2105 152/96 mmHg - - 98 15 95 % - -  03/08/14 1900 149/84 mmHg - - 91 15 95 % - -  03/08/14 1845 148/90 mmHg - - 93 18 95 % - -  03/08/14  1830 145/80 mmHg - - 93 17 96 % - -  03/08/14 1736 - 100 F (37.8 C) Rectal - - - - -  03/08/14 1702 139/82 mmHg - - 103 - 93 % 6' (1.829 m) 63.504 kg (140 lb)    1. General:  in No Acute distress 2. Psychological: somnolent but  Oriented 3. Head/ENT:    Dry Mucous Membranes                          Head Non traumatic, neck supple                            Poor Dentition 4. SKIN:  decreased Skin turgor,  Skin clean Dry and intact Right wrist very tender to palpation, swollen and red 5. Heart: Regular rate and rhythm no Murmur, Rub or gallop 6. Lungs: Clear to auscultation bilaterally, no wheezes or crackles   7. Abdomen: Soft, non-tender, Non distended 8. Lower extremities: no clubbing, cyanosis, or edema 9. Neurologically Grossly intact, moving all 4 extremities equally 10. MSK: Normal range of motion  body mass index is 18.98 kg/(m^2).   Labs on Admission:   Recent Labs  03/08/14 1759  NA 140  K 3.5*  CL 103  CO2 22  GLUCOSE 100*  BUN 17  CREATININE 0.78  CALCIUM 8.9    Recent Labs  03/08/14 1759  AST 42*  ALT 14  ALKPHOS 69  BILITOT 1.0  PROT 7.2  ALBUMIN 3.2*   No results found for this basename: LIPASE, AMYLASE,  in the last 72 hours  Recent Labs  03/08/14 1759  WBC 9.3  NEUTROABS 7.4  HGB 8.8*  HCT 25.6*  MCV 95.5  PLT 203    Recent Labs  03/08/14 1759  TROPONINI <0.30   No results found for this basename: TSH, T4TOTAL, FREET3, T3FREE, THYROIDAB,  in the last 72 hours No results found for this basename: VITAMINB12, FOLATE, FERRITIN, TIBC, IRON, RETICCTPCT,  in the last 72 hours No results found for this basename: HGBA1C    Estimated Creatinine Clearance: 59.5 ml/min (by C-G formula based on Cr of 0.78). ABG No results found for this basename: phart, pco2, po2, hco3, tco2, acidbasedef, o2sat     No results found for this basename: DDIMER     Other results:  I have pearsonaly reviewed this: ECG REPORT  Rate 140  Rhythm: ectopic  tachycardia ST&T Change: no ischemia   UA evidence of UTI  BNP (last 3 results) No results found for this basename: PROBNP,  in the last 8760 hours  Filed Weights   03/08/14 1702  Weight: 63.504 kg (140 lb)     Cultures: No results found for this basename: sdes, specrequest, cult, reptstatus       Radiological Exams on Admission: Dg Chest 2 View  03/08/2014   CLINICAL DATA:  FEVER FALL  EXAM: CHEST - 2 VIEW  COMPARISON:  02/17/2013  FINDINGS: Lungs clear.  Heart size upper limits normal.  No pneumothorax. No effusion. Visualized skeletal structures are unremarkable.  IMPRESSION: No acute cardiopulmonary disease.   Electronically Signed   By: Oley Balm M.D.   On: 03/08/2014 20:24   Dg Pelvis 1-2 Views  03/08/2014   CLINICAL DATA:  fall  EXAM: PELVIS - 1-2 VIEW  COMPARISON:  02/17/2013  FINDINGS: Orthopedic screws across the left femoral neck as before. Bilateral hip osteoarthritis right worse than left. No acute fracture or dislocation. Bony pelvis intact. Increase in size and number of lamellated pelvic calcifications suggesting bladder calculi.  IMPRESSION: 1. Negative for fracture, dislocation, or other acute abnormality. 2. Degenerative and postop changes as above.   Electronically Signed   By: Oley Balm M.D.   On: 03/08/2014 20:26   Dg Shoulder Right  03/08/2014   CLINICAL DATA:  pain  EXAM: RIGHT SHOULDER - 2+ VIEW  COMPARISON:  None.  FINDINGS: There is no evidence of fracture or dislocation. Calcification in soft tissues near the rotator cuff insertion. No significant osseous degenerative change.  IMPRESSION: 1. Negative for fracture or dislocation. 2. Calcific tendinitis.   Electronically Signed   By: Oley Balm M.D.   On: 03/08/2014 20:23   Dg Elbow Complete Right  03/08/2014   CLINICAL DATA:  pain  EXAM: RIGHT ELBOW - COMPLETE 3+ VIEW  COMPARISON:  None.  FINDINGS: There is no evidence of fracture or dislocation. 6 mm corticated ossicle medial to the medial  epicondyle. Mild diffuse chondrocalcinosis. There is not a good lateral to exclude effusion. Soft tissues are unremarkable.  IMPRESSION: 1. No fracture or other acute abnormality. 2. Degenerative changes as above.   Electronically Signed   By: Oley Balm M.D.   On: 03/08/2014 20:22   Dg Wrist Complete Right  03/08/2014   CLINICAL DATA:  hand swelling and hot  EXAM: RIGHT WRIST - COMPLETE 3+ VIEW  COMPARISON:  None.  FINDINGS: There is no evidence of fracture or dislocation. Chondrocalcinosis in the wrist. Heterotopic calcification at the second, third, and fifth MCP joints. Carpal rows intact. Diffuse osteopenia. Soft tissues are unremarkable.  IMPRESSION: 1. Negative for fracture or other acute bone abnormality. 2. Diffuse osteopenia. 3. Extensive chondrocalcinosis suggesting CPPD.   Electronically Signed   By: Oley Balm M.D.   On: 03/08/2014 20:21   Ct Head Wo Contrast  03/08/2014   CLINICAL DATA:  Fall.  Head injury.  EXAM: CT HEAD WITHOUT CONTRAST  CT CERVICAL SPINE WITHOUT CONTRAST  TECHNIQUE: Multidetector CT imaging of the head and cervical spine was performed following the standard protocol without intravenous contrast. Multiplanar CT image reconstructions of the cervical spine were also generated.  COMPARISON:  Head CT from 02/17/2013  FINDINGS: CT HEAD FINDINGS  There is no evidence for acute hemorrhage, hydrocephalus, mass lesion, or abnormal extra-axial fluid collection. No definite CT evidence for acute infarction. Diffuse loss of parenchymal volume is consistent with atrophy. Patchy low attenuation in the deep hemispheric and periventricular white matter is nonspecific, but likely reflects chronic microvascular ischemic demyelination. No evidence for skull fracture  CT CERVICAL SPINE FINDINGS  Imaging was obtained from the skullbase through the T2 vertebral body. No evidence for fracture. There is diffuse degenerative disc disease. Trace retrolisthesis of C2 on 3 and C3 on 4 and is  compatible with the loss of disc height relative to the preservation of facet space. More advanced facet  osteoarthritis is seen at C4-5. No prevertebral soft tissue swelling. Pleural-parenchymal scarring is noted in the lung apices  IMPRESSION: No acute intracranial abnormality. There is atrophy with chronic small vessel white matter ischemic disease.  Degenerative changes in the upper cervical spine without evidence for fracture.   Electronically Signed   By: Kennith Center M.D.   On: 03/08/2014 19:26   Ct Cervical Spine Wo Contrast  03/08/2014   CLINICAL DATA:  Fall.  Head injury.  EXAM: CT HEAD WITHOUT CONTRAST  CT CERVICAL SPINE WITHOUT CONTRAST  TECHNIQUE: Multidetector CT imaging of the head and cervical spine was performed following the standard protocol without intravenous contrast. Multiplanar CT image reconstructions of the cervical spine were also generated.  COMPARISON:  Head CT from 02/17/2013  FINDINGS: CT HEAD FINDINGS  There is no evidence for acute hemorrhage, hydrocephalus, mass lesion, or abnormal extra-axial fluid collection. No definite CT evidence for acute infarction. Diffuse loss of parenchymal volume is consistent with atrophy. Patchy low attenuation in the deep hemispheric and periventricular white matter is nonspecific, but likely reflects chronic microvascular ischemic demyelination. No evidence for skull fracture  CT CERVICAL SPINE FINDINGS  Imaging was obtained from the skullbase through the T2 vertebral body. No evidence for fracture. There is diffuse degenerative disc disease. Trace retrolisthesis of C2 on 3 and C3 on 4 and is compatible with the loss of disc height relative to the preservation of facet space. More advanced facet osteoarthritis is seen at C4-5. No prevertebral soft tissue swelling. Pleural-parenchymal scarring is noted in the lung apices  IMPRESSION: No acute intracranial abnormality. There is atrophy with chronic small vessel white matter ischemic disease.   Degenerative changes in the upper cervical spine without evidence for fracture.   Electronically Signed   By: Kennith Center M.D.   On: 03/08/2014 19:26   Dg Abd 2 Views  03/08/2014   CLINICAL DATA:  FEVER FALL  EXAM: ABDOMEN - 2 VIEW  COMPARISON:  02/17/2013 and earlier studies  FINDINGS: Normal bowel gas pattern. No free air. Visualized lung bases clear. Bilateral hip osteoarthritis right worse than left. Orthopedic screws across the left femoral neck. Bladder calculi.  IMPRESSION: 1. No acute abnormality. 2. Degenerative and postop changes as above.   Electronically Signed   By: Oley Balm M.D.   On: 03/08/2014 20:27   Dg Hand Complete Right  03/08/2014   CLINICAL DATA:  Redness and swelling with pain  EXAM: RIGHT HAND - COMPLETE 3+ VIEW  COMPARISON:  None.  FINDINGS: No acute fracture or dislocation is noted. Generalized soft tissue swelling is noted. Diffuse periarticular calcifications are noted particularly at the MCP joints which may represent gouty tophi.  IMPRESSION: No acute fracture or dislocation is noted. Soft tissue periarticular calcifications are noted which may be related to gout.   Electronically Signed   By: Alcide Clever M.D.   On: 03/08/2014 20:21    Chart has been reviewed  Assessment/Plan  80 year old gentleman with history of BPH here after a fall unclear if due to syncope was found to have cellulitis versus pseudogout of the right wrist and urinary tract infection leading to sepsis.  Present on Admission:  . Sepsis  - continue IV fluids, continue Zosyn and vancomycin await results of blood and urine culture  . Cellulitis of right hand versus pseudogout. Spoke to hand surgery Dr Orlan Leavens who will see the patient in consult. For now cover vancomycin and Zosyn. In case it is due to pseudogout we'll start on colchicine  obtain sedimentation rate  . BPH (benign prostatic hyperplasia) continue Flomax obtain postvoid residuals  . Anemia obtain anemia panel transfuse as needed for  hemoglobin below 7 Hemoccult stool x3 in case it is a slow bleed obtain anemia panel  . UTI (lower urinary tract infection) cover Zosyn await results of urine culture  . Hypokalemia - will replace   fall unclear etiology most likely patient had trouble ambulating the walker secondary to right wrist pain. But cannot rule out syncope. Cycle cardiac enzymes monitor on telemetry obtain echo Low extremity swelling- low albumin noted will check prealbumin patient have had poor by mouth intake. Also we'll evaluate cardiac function with echo gram. Patient appears to be intravascularly depleted. Given worsening swelling overnight will obtain Dopplers. Given presenting tachycardia d-dimer is positive and oral Dopplers were positive for DVT patient should be evaluated for PE.  Prophylaxis: SCDs, Protonix  CODE STATUS:  FULL CODE    Other plan as per orders.  I have spent a total of 55 min on this admission  [name] 03/08/2014, 9:14 PM  [facility]  Pager [phone]   If 7AM-7PM, please contact the day team taking care of the patient  [email or web address]  Password [email or web address]

## 2014-03-08 NOTE — ED Notes (Signed)
PA at bedside. Family at bedside and given plan of care.

## 2014-03-08 NOTE — Consult Note (Signed)
Reason for Consult:right wrist swelling/pain Referring Physician: Hospitalist service  [name] is an 80 y.o. male.  HPI: The history is provided by the patient and a relative. No language interpreter was used.  [name] is an 80 y/o M with PMHx of BPH, GERD, HLD, femoral neck surgery presenting to the ED with a fall that occurred today. As per family, son and daughter at bedside - not really sure occurred. Reported that the sitter that stays at the house reported that the patient was found on the floor - family is unable to identify how he ended up on the floor or knew where he landed. Reported that the sitter stated that he went to the bathroom and was gone for 2 hours and decided to check up on him - family reported that they do not know the methods of how he fell. Patient reported that he felt mildly dizzy prior to falling. Stated that he has been having some diarrhea intermittently. Stated that he was recommended to come to the ED. Stated that his right hand has been increased in redness and tender to touch. Son reported that patient was seen and assessed regarding this approximately 2 weeks ago and was started on Bactrim - course has been completed. Denied chest pain, shortness of breath, difficulty breathing, abdominal pain, nausea, vomiting, melena, hematochezia. Family denied changes in behavior. Reported that he is able to hold a conversation - reported that he appears more fatigued.    Past Medical History  Diagnosis Date  . BPH (benign prostatic hyperplasia)   . GERD (gastroesophageal reflux disease)   . Hyperlipidemia   . Fracture of femoral neck, left 02/17/2013    Past Surgical History  Procedure Laterality Date  . Colon surgery    . Percutaneous pinning Left 02/17/2013    Procedure: PERCUTANEOUS PINNING HIP;  Surgeon: Johnny Bridge, MD;  Location: [facility];  Service: Orthopedics;  Laterality: Left;    Family History  Problem Relation Age of Onset  . Hypertension  Sister   . Migraines Sister     Social History:  reports that he has never smoked. He does not have any smokeless tobacco history on file. He reports that he does not drink alcohol or use illicit drugs.  Allergies: No Known Allergies  Medications: I have reviewed the patient's current medications.  Results for orders placed during the hospital encounter of 03/08/14 (from the past 48 hour(s))  LACTIC ACID, PLASMA     Status: None   Collection Time    03/08/14  5:25 PM      Result Value Ref Range   Lactic Acid, Venous 1.4  0.5 - 2.2 mmol/L  CBC WITH DIFFERENTIAL     Status: Abnormal   Collection Time    03/08/14  5:59 PM      Result Value Ref Range   WBC 9.3  4.0 - 10.5 K/uL   RBC 2.68 (*) 4.22 - 5.81 MIL/uL   Hemoglobin 8.8 (*) 13.0 - 17.0 g/dL   HCT 25.6 (*) 39.0 - 52.0 %   MCV 95.5  78.0 - 100.0 fL   MCH 32.8  26.0 - 34.0 pg   MCHC 34.4  30.0 - 36.0 g/dL   RDW 13.4  11.5 - 15.5 %   Platelets 203  150 - 400 K/uL   Neutrophils Relative % 79 (*) 43 - 77 %   Neutro Abs 7.4  1.7 - 7.7 K/uL   Lymphocytes Relative 9 (*) 12 - 46 %  Lymphs Abs 0.8  0.7 - 4.0 K/uL   Monocytes Relative 12  3 - 12 %   Monocytes Absolute 1.1 (*) 0.1 - 1.0 K/uL   Eosinophils Relative 0  0 - 5 %   Eosinophils Absolute 0.0  0.0 - 0.7 K/uL   Basophils Relative 0  0 - 1 %   Basophils Absolute 0.0  0.0 - 0.1 K/uL  COMPREHENSIVE METABOLIC PANEL     Status: Abnormal   Collection Time    03/08/14  5:59 PM      Result Value Ref Range   Sodium 140  137 - 147 mEq/L   Potassium 3.5 (*) 3.7 - 5.3 mEq/L   Chloride 103  96 - 112 mEq/L   CO2 22  19 - 32 mEq/L   Glucose, Bld 100 (*) 70 - 99 mg/dL   BUN 17  6 - 23 mg/dL   Creatinine, Ser 0.78  0.50 - 1.35 mg/dL   Calcium 8.9  8.4 - 10.5 mg/dL   Total Protein 7.2  6.0 - 8.3 g/dL   Albumin 3.2 (*) 3.5 - 5.2 g/dL   AST 42 (*) 0 - 37 U/L   ALT 14  0 - 53 U/L   Alkaline Phosphatase 69  39 - 117 U/L   Total Bilirubin 1.0  0.3 - 1.2 mg/dL   GFR calc non Af Amer  79 (*) >90 mL/min   GFR calc Af Amer >90  >90 mL/min   Comment: (NOTE)     The eGFR has been calculated using the CKD EPI equation.     This calculation has not been validated in all clinical situations.     eGFR's persistently <90 mL/min signify possible Chronic Kidney     Disease.   Anion gap 15  5 - 15  TROPONIN I     Status: None   Collection Time    03/08/14  5:59 PM      Result Value Ref Range   Troponin I <0.30  <0.30 ng/mL   Comment:            Due to the release kinetics of cTnI,     a negative result within the first hours     of the onset of symptoms does not rule out     myocardial infarction with certainty.     If myocardial infarction is still suspected,     repeat the test at appropriate intervals.  URINALYSIS, ROUTINE W REFLEX MICROSCOPIC     Status: Abnormal   Collection Time    03/08/14  6:34 PM      Result Value Ref Range   Color, Urine AMBER (*) YELLOW   Comment: BIOCHEMICALS MAY BE AFFECTED BY COLOR   APPearance TURBID (*) CLEAR   Specific Gravity, Urine 1.018  1.005 - 1.030   pH 6.0  5.0 - 8.0   Glucose, UA NEGATIVE  NEGATIVE mg/dL   Hgb urine dipstick LARGE (*) NEGATIVE   Bilirubin Urine NEGATIVE  NEGATIVE   Ketones, ur NEGATIVE  NEGATIVE mg/dL   Protein, ur 100 (*) NEGATIVE mg/dL   Urobilinogen, UA 1.0  0.0 - 1.0 mg/dL   Nitrite NEGATIVE  NEGATIVE   Leukocytes, UA LARGE (*) NEGATIVE  URINE MICROSCOPIC-ADD ON     Status: Abnormal   Collection Time    03/08/14  6:34 PM      Result Value Ref Range   WBC, UA TOO NUMEROUS TO COUNT  <3 WBC/hpf   RBC / HPF 21-50  <  3 RBC/hpf   Bacteria, UA FEW (*) RARE  POC OCCULT BLOOD, ED     Status: None   Collection Time    03/08/14  8:36 PM      Result Value Ref Range   Fecal Occult Bld NEGATIVE  NEGATIVE    Dg Chest 2 View  03/08/2014   CLINICAL DATA:  FEVER FALL  EXAM: CHEST - 2 VIEW  COMPARISON:  02/17/2013  FINDINGS: Lungs clear.  Heart size upper limits normal.  No pneumothorax. No effusion. Visualized  skeletal structures are unremarkable.  IMPRESSION: No acute cardiopulmonary disease.   Electronically Signed   By: Arne Cleveland M.D.   On: 03/08/2014 20:24   Dg Pelvis 1-2 Views  03/08/2014   CLINICAL DATA:  fall  EXAM: PELVIS - 1-2 VIEW  COMPARISON:  02/17/2013  FINDINGS: Orthopedic screws across the left femoral neck as before. Bilateral hip osteoarthritis right worse than left. No acute fracture or dislocation. Bony pelvis intact. Increase in size and number of lamellated pelvic calcifications suggesting bladder calculi.  IMPRESSION: 1. Negative for fracture, dislocation, or other acute abnormality. 2. Degenerative and postop changes as above.   Electronically Signed   By: Arne Cleveland M.D.   On: 03/08/2014 20:26   Dg Shoulder Right  03/08/2014   CLINICAL DATA:  pain  EXAM: RIGHT SHOULDER - 2+ VIEW  COMPARISON:  None.  FINDINGS: There is no evidence of fracture or dislocation. Calcification in soft tissues near the rotator cuff insertion. No significant osseous degenerative change.  IMPRESSION: 1. Negative for fracture or dislocation. 2. Calcific tendinitis.   Electronically Signed   By: Arne Cleveland M.D.   On: 03/08/2014 20:23   Dg Elbow Complete Right  03/08/2014   CLINICAL DATA:  pain  EXAM: RIGHT ELBOW - COMPLETE 3+ VIEW  COMPARISON:  None.  FINDINGS: There is no evidence of fracture or dislocation. 6 mm corticated ossicle medial to the medial epicondyle. Mild diffuse chondrocalcinosis. There is not a good lateral to exclude effusion. Soft tissues are unremarkable.  IMPRESSION: 1. No fracture or other acute abnormality. 2. Degenerative changes as above.   Electronically Signed   By: Arne Cleveland M.D.   On: 03/08/2014 20:22   Dg Wrist Complete Right  03/08/2014   CLINICAL DATA:  hand swelling and hot  EXAM: RIGHT WRIST - COMPLETE 3+ VIEW  COMPARISON:  None.  FINDINGS: There is no evidence of fracture or dislocation. Chondrocalcinosis in the wrist. Heterotopic calcification at the second,  third, and fifth MCP joints. Carpal rows intact. Diffuse osteopenia. Soft tissues are unremarkable.  IMPRESSION: 1. Negative for fracture or other acute bone abnormality. 2. Diffuse osteopenia. 3. Extensive chondrocalcinosis suggesting CPPD.   Electronically Signed   By: Arne Cleveland M.D.   On: 03/08/2014 20:21   Ct Head Wo Contrast  03/08/2014   CLINICAL DATA:  Fall.  Head injury.  EXAM: CT HEAD WITHOUT CONTRAST  CT CERVICAL SPINE WITHOUT CONTRAST  TECHNIQUE: Multidetector CT imaging of the head and cervical spine was performed following the standard protocol without intravenous contrast. Multiplanar CT image reconstructions of the cervical spine were also generated.  COMPARISON:  Head CT from 02/17/2013  FINDINGS: CT HEAD FINDINGS  There is no evidence for acute hemorrhage, hydrocephalus, mass lesion, or abnormal extra-axial fluid collection. No definite CT evidence for acute infarction. Diffuse loss of parenchymal volume is consistent with atrophy. Patchy low attenuation in the deep hemispheric and periventricular white matter is nonspecific, but likely reflects chronic microvascular ischemic  demyelination. No evidence for skull fracture  CT CERVICAL SPINE FINDINGS  Imaging was obtained from the skullbase through the T2 vertebral body. No evidence for fracture. There is diffuse degenerative disc disease. Trace retrolisthesis of C2 on 3 and C3 on 4 and is compatible with the loss of disc height relative to the preservation of facet space. More advanced facet osteoarthritis is seen at C4-5. No prevertebral soft tissue swelling. Pleural-parenchymal scarring is noted in the lung apices  IMPRESSION: No acute intracranial abnormality. There is atrophy with chronic small vessel white matter ischemic disease.  Degenerative changes in the upper cervical spine without evidence for fracture.   Electronically Signed   By: Misty Stanley M.D.   On: 03/08/2014 19:26   Ct Cervical Spine Wo Contrast  03/08/2014   CLINICAL  DATA:  Fall.  Head injury.  EXAM: CT HEAD WITHOUT CONTRAST  CT CERVICAL SPINE WITHOUT CONTRAST  TECHNIQUE: Multidetector CT imaging of the head and cervical spine was performed following the standard protocol without intravenous contrast. Multiplanar CT image reconstructions of the cervical spine were also generated.  COMPARISON:  Head CT from 02/17/2013  FINDINGS: CT HEAD FINDINGS  There is no evidence for acute hemorrhage, hydrocephalus, mass lesion, or abnormal extra-axial fluid collection. No definite CT evidence for acute infarction. Diffuse loss of parenchymal volume is consistent with atrophy. Patchy low attenuation in the deep hemispheric and periventricular white matter is nonspecific, but likely reflects chronic microvascular ischemic demyelination. No evidence for skull fracture  CT CERVICAL SPINE FINDINGS  Imaging was obtained from the skullbase through the T2 vertebral body. No evidence for fracture. There is diffuse degenerative disc disease. Trace retrolisthesis of C2 on 3 and C3 on 4 and is compatible with the loss of disc height relative to the preservation of facet space. More advanced facet osteoarthritis is seen at C4-5. No prevertebral soft tissue swelling. Pleural-parenchymal scarring is noted in the lung apices  IMPRESSION: No acute intracranial abnormality. There is atrophy with chronic small vessel white matter ischemic disease.  Degenerative changes in the upper cervical spine without evidence for fracture.   Electronically Signed   By: Misty Stanley M.D.   On: 03/08/2014 19:26   Dg Abd 2 Views  03/08/2014   CLINICAL DATA:  FEVER FALL  EXAM: ABDOMEN - 2 VIEW  COMPARISON:  02/17/2013 and earlier studies  FINDINGS: Normal bowel gas pattern. No free air. Visualized lung bases clear. Bilateral hip osteoarthritis right worse than left. Orthopedic screws across the left femoral neck. Bladder calculi.  IMPRESSION: 1. No acute abnormality. 2. Degenerative and postop changes as above.    Electronically Signed   By: Arne Cleveland M.D.   On: 03/08/2014 20:27   Dg Hand Complete Right  03/08/2014   CLINICAL DATA:  Redness and swelling with pain  EXAM: RIGHT HAND - COMPLETE 3+ VIEW  COMPARISON:  None.  FINDINGS: No acute fracture or dislocation is noted. Generalized soft tissue swelling is noted. Diffuse periarticular calcifications are noted particularly at the MCP joints which may represent gouty tophi.  IMPRESSION: No acute fracture or dislocation is noted. Soft tissue periarticular calcifications are noted which may be related to gout.   Electronically Signed   By: Inez Catalina M.D.   On: 03/08/2014 20:21    ROS as per chart Blood pressure 152/96, pulse 98, temperature 100 F (37.8 C), temperature source Rectal, resp. rate 15, height 6' (1.829 m), weight 63.504 kg (140 lb), SpO2 95.00%. Physical Exam Brief exam of right upper  extremity: Mild swelling and redness over dorsum of right hand.  Mild warmth, there is assymmetry compared to contralateral side. Fingers warm swollen, swelling over mp joints and radiocarpal and midcarpal joints. No open wounds No facial grimacing with passive motion of wrist or forearm rotation No ascending erythema or lymphangitis  Assessment/Plan: Right wrist inflammatory arthropathy, most consistent with cppd and or gout  Pt being admitted on iv antibiotics for uti Would immobilize wrist  Watch right now do not recommend further imaging or aspiration at current time Will continue to follow Ice, elevate, Medical treatment for cppd/gout  Linna Hoff 03/08/2014, 10:17 PM

## 2014-03-08 NOTE — ED Provider Notes (Signed)
CSN: [id number]     Arrival date & time 03/08/14  1654 History   First MD Initiated Contact with Patient 03/08/14 1818     Chief Complaint  Patient presents with  . Fever  . Fall     (Consider location/radiation/quality/duration/timing/severity/associated sxs/prior Treatment) The history is provided by the patient and a relative. No language interpreter was used.  [name] is an 80 y/o M with PMHx of BPH, GERD, HLD, femoral neck surgery presenting to the ED with a fall that occurred today. As per family, son and daughter at bedside - not really sure occurred. Reported that the sitter that stays at the house reported that the patient was found on the floor - family is unable to identify how he ended up on the floor or knew where he landed. Reported that the sitter stated that he went to the bathroom and was gone for 2 hours and decided to check up on him - family reported that they do not know the methods of how he fell. Patient reported that he felt mildly dizzy prior to falling. Stated that he has been having some diarrhea intermittently. Stated that he was recommended to come to the ED. Stated that his right hand has been increased in redness and tender to touch. Son reported that patient was seen and assessed regarding this approximately 2 weeks ago and was started on Bactrim - course has been completed. Denied chest pain, shortness of breath, difficulty breathing, abdominal pain, nausea, vomiting, melena, hematochezia. Family denied changes in behavior. Reported that he is able to hold a conversation - reported that he appears more fatigued.  PCP Dr. Tanya Nones  Past Medical History  Diagnosis Date  . BPH (benign prostatic hyperplasia)   . GERD (gastroesophageal reflux disease)   . Hyperlipidemia   . Fracture of femoral neck, left 02/17/2013   Past Surgical History  Procedure Laterality Date  . Colon surgery    . Percutaneous pinning Left 02/17/2013    Procedure: PERCUTANEOUS PINNING  HIP;  Surgeon: Eulas Post, MD;  Location: [facility] OR;  Service: Orthopedics;  Laterality: Left;   History reviewed. No pertinent family history. History  Substance Use Topics  . Smoking status: Never Smoker   . Smokeless tobacco: Not on file  . Alcohol Use: No    Review of Systems  Constitutional: Positive for fever and fatigue. Negative for chills.  Respiratory: Negative for chest tightness and shortness of breath.   Cardiovascular: Negative for chest pain.  Gastrointestinal: Negative for nausea, vomiting, abdominal pain and diarrhea.  Musculoskeletal: Positive for arthralgias (Right hand pain). Negative for back pain and neck pain.  Neurological: Negative for weakness and headaches.      Allergies  Review of patient's allergies indicates no known allergies.  Home Medications   Prior to Admission medications   Medication Sig Start Date End Date Taking? Authorizing Provider  polyethylene glycol powder (GLYCOLAX/MIRALAX) powder Take 17 g by mouth daily as needed for mild constipation. 02/16/14  Yes Salley Scarlet, MD  tamsulosin (FLOMAX) 0.4 MG CAPS capsule Take 1 capsule (0.4 mg total) by mouth daily. 05/29/13  Yes Donita Brooks, MD  traMADol (ULTRAM) 50 MG tablet Take 1 tablet (50 mg total) by mouth 2 (two) times daily as needed. 02/16/14  Yes Salley Scarlet, MD  solifenacin (VESICARE) 10 MG tablet Take 1 tablet (10 mg total) by mouth daily. 02/27/14   Donita Brooks, MD   BP 149/84  Pulse 91  Temp(Src) 100  F (37.8 C) (Rectal)  Resp 15  Ht 6' (1.829 m)  Wt 140 lb (63.504 kg)  BMI 18.98 kg/m2  SpO2 95% Physical Exam  Nursing note and vitals reviewed. Constitutional: He is oriented to person, place, and time. No distress.  Frail appearing elderly male, chronically ill-appearing  HENT:  Head: Normocephalic and atraumatic.  Mouth/Throat: No oropharyngeal exudate.  Dry mucous membranes  Negative palpation hematomas-negative signs of trauma or crepitus upon  palpation to the skull  Eyes: Conjunctivae and EOM are normal. Pupils are equal, round, and reactive to light. Right eye exhibits no discharge. Left eye exhibits no discharge.  Neck: Normal range of motion. Neck supple. No tracheal deviation present.  Negative neck stiffness Negative nuchal rigidity Negative cervical lymphadenopathy Negative meningeal signs  Cardiovascular: Normal rate, regular rhythm and normal heart sounds.  Exam reveals no friction rub.   No murmur heard. Pulses:      Radial pulses are 2+ on the right side, and 2+ on the left side.       Dorsalis pedis pulses are 2+ on the right side, and 2+ on the left side.  Cap refill less than 3 seconds Negative swelling or pitting edema identified to the lower extremities  Pulmonary/Chest: Effort normal and breath sounds normal. No respiratory distress. He has no wheezes. He has no rales. He exhibits no tenderness.  Abdominal: Soft. Bowel sounds are normal. He exhibits no distension. There is no tenderness. There is no rebound and no guarding.  Genitourinary:  Rectal exam: Strong sphincter tone. Negative blood on glove. Brown stools noted on glove. Exam chaperoned with tech  Musculoskeletal: He exhibits edema and tenderness.  Swelling, erythema, warmth upon palpation to the right hand and wrist with decreased range of motion secondary to pain. Patient is able to make a fist with the right hand. Decreased range of motion to the right wrist-flexion and extension secondary to pain. Decreased range of motion to the right elbow and right shoulder secondary to pain. Full range of motion to left upper extremity without difficulty.  Lymphadenopathy:    He has no cervical adenopathy.  Neurological: He is alert and oriented to person, place, and time. No cranial nerve deficit. He exhibits normal muscle tone. Coordination normal.  Cranial nerves III-XII grossly intact Strength 5+/5+ to upper and lower extremities bilaterally with resistance  applied, equal distribution noted Strength intact to MCP, PIP, DIP joints of bilateral hands Negative facial drooping Negative aphasia Slurred speech noted-according to family members this appears to be a chronic issue GCS 15 Patient follows commands without difficulty Patient able to answer questions appropriately  Skin: Skin is warm and dry. No rash noted. He is not diaphoretic. No erythema.  Psychiatric: He has a normal mood and affect. His behavior is normal. Thought content normal.    ED Course  Procedures (including critical care time)  Results for orders placed during the hospital encounter of 03/08/14  LACTIC ACID, PLASMA      Result Value Ref Range   Lactic Acid, Venous 1.4  0.5 - 2.2 mmol/L  CBC WITH DIFFERENTIAL      Result Value Ref Range   WBC 9.3  4.0 - 10.5 K/uL   RBC 2.68 (*) 4.22 - 5.81 MIL/uL   Hemoglobin 8.8 (*) 13.0 - 17.0 g/dL   HCT 16.1 (*) 09.6 - 04.5 %   MCV 95.5  78.0 - 100.0 fL   MCH 32.8  26.0 - 34.0 pg   MCHC 34.4  30.0 - 36.0  g/dL   RDW 16.1  09.6 - 04.5 %   Platelets 203  150 - 400 K/uL   Neutrophils Relative % 79 (*) 43 - 77 %   Neutro Abs 7.4  1.7 - 7.7 K/uL   Lymphocytes Relative 9 (*) 12 - 46 %   Lymphs Abs 0.8  0.7 - 4.0 K/uL   Monocytes Relative 12  3 - 12 %   Monocytes Absolute 1.1 (*) 0.1 - 1.0 K/uL   Eosinophils Relative 0  0 - 5 %   Eosinophils Absolute 0.0  0.0 - 0.7 K/uL   Basophils Relative 0  0 - 1 %   Basophils Absolute 0.0  0.0 - 0.1 K/uL  COMPREHENSIVE METABOLIC PANEL      Result Value Ref Range   Sodium 140  137 - 147 mEq/L   Potassium 3.5 (*) 3.7 - 5.3 mEq/L   Chloride 103  96 - 112 mEq/L   CO2 22  19 - 32 mEq/L   Glucose, Bld 100 (*) 70 - 99 mg/dL   BUN 17  6 - 23 mg/dL   Creatinine, Ser 4.09  0.50 - 1.35 mg/dL   Calcium 8.9  8.4 - 81.1 mg/dL   Total Protein 7.2  6.0 - 8.3 g/dL   Albumin 3.2 (*) 3.5 - 5.2 g/dL   AST 42 (*) 0 - 37 U/L   ALT 14  0 - 53 U/L   Alkaline Phosphatase 69  39 - 117 U/L   Total Bilirubin  1.0  0.3 - 1.2 mg/dL   GFR calc non Af Amer 79 (*) >90 mL/min   GFR calc Af Amer >90  >90 mL/min   Anion gap 15  5 - 15  TROPONIN I      Result Value Ref Range   Troponin I <0.30  <0.30 ng/mL  URINALYSIS, ROUTINE W REFLEX MICROSCOPIC      Result Value Ref Range   Color, Urine AMBER (*) YELLOW   APPearance TURBID (*) CLEAR   Specific Gravity, Urine 1.018  1.005 - 1.030   pH 6.0  5.0 - 8.0   Glucose, UA NEGATIVE  NEGATIVE mg/dL   Hgb urine dipstick LARGE (*) NEGATIVE   Bilirubin Urine NEGATIVE  NEGATIVE   Ketones, ur NEGATIVE  NEGATIVE mg/dL   Protein, ur 914 (*) NEGATIVE mg/dL   Urobilinogen, UA 1.0  0.0 - 1.0 mg/dL   Nitrite NEGATIVE  NEGATIVE   Leukocytes, UA LARGE (*) NEGATIVE  URINE MICROSCOPIC-ADD ON      Result Value Ref Range   WBC, UA TOO NUMEROUS TO COUNT  <3 WBC/hpf   RBC / HPF 21-50  <3 RBC/hpf   Bacteria, UA FEW (*) RARE    Labs Review Labs Reviewed  CBC WITH DIFFERENTIAL - Abnormal; Notable for the following:    RBC 2.68 (*)    Hemoglobin 8.8 (*)    HCT 25.6 (*)    Neutrophils Relative % 79 (*)    Lymphocytes Relative 9 (*)    Monocytes Absolute 1.1 (*)    All other components within normal limits  COMPREHENSIVE METABOLIC PANEL - Abnormal; Notable for the following:    Potassium 3.5 (*)    Glucose, Bld 100 (*)    Albumin 3.2 (*)    AST 42 (*)    GFR calc non Af Amer 79 (*)    All other components within normal limits  URINALYSIS, ROUTINE W REFLEX MICROSCOPIC - Abnormal; Notable for the following:    Color, Urine  AMBER (*)    APPearance TURBID (*)    Hgb urine dipstick LARGE (*)    Protein, ur 100 (*)    Leukocytes, UA LARGE (*)    All other components within normal limits  URINE MICROSCOPIC-ADD ON - Abnormal; Notable for the following:    Bacteria, UA FEW (*)    All other components within normal limits  CULTURE, BLOOD (ROUTINE X 2)  CULTURE, BLOOD (ROUTINE X 2)  URINE CULTURE  LACTIC ACID, PLASMA  TROPONIN I  OCCULT BLOOD X 1 CARD TO LAB, STOOL     Imaging Review Ct Head Wo Contrast  03/08/2014   CLINICAL DATA:  Fall.  Head injury.  EXAM: CT HEAD WITHOUT CONTRAST  CT CERVICAL SPINE WITHOUT CONTRAST  TECHNIQUE: Multidetector CT imaging of the head and cervical spine was performed following the standard protocol without intravenous contrast. Multiplanar CT image reconstructions of the cervical spine were also generated.  COMPARISON:  Head CT from 02/17/2013  FINDINGS: CT HEAD FINDINGS  There is no evidence for acute hemorrhage, hydrocephalus, mass lesion, or abnormal extra-axial fluid collection. No definite CT evidence for acute infarction. Diffuse loss of parenchymal volume is consistent with atrophy. Patchy low attenuation in the deep hemispheric and periventricular white matter is nonspecific, but likely reflects chronic microvascular ischemic demyelination. No evidence for skull fracture  CT CERVICAL SPINE FINDINGS  Imaging was obtained from the skullbase through the T2 vertebral body. No evidence for fracture. There is diffuse degenerative disc disease. Trace retrolisthesis of C2 on 3 and C3 on 4 and is compatible with the loss of disc height relative to the preservation of facet space. More advanced facet osteoarthritis is seen at C4-5. No prevertebral soft tissue swelling. Pleural-parenchymal scarring is noted in the lung apices  IMPRESSION: No acute intracranial abnormality. There is atrophy with chronic small vessel white matter ischemic disease.  Degenerative changes in the upper cervical spine without evidence for fracture.   Electronically Signed   By: Kennith Center M.D.   On: 03/08/2014 19:26   Ct Cervical Spine Wo Contrast  03/08/2014   CLINICAL DATA:  Fall.  Head injury.  EXAM: CT HEAD WITHOUT CONTRAST  CT CERVICAL SPINE WITHOUT CONTRAST  TECHNIQUE: Multidetector CT imaging of the head and cervical spine was performed following the standard protocol without intravenous contrast. Multiplanar CT image reconstructions of the cervical spine  were also generated.  COMPARISON:  Head CT from 02/17/2013  FINDINGS: CT HEAD FINDINGS  There is no evidence for acute hemorrhage, hydrocephalus, mass lesion, or abnormal extra-axial fluid collection. No definite CT evidence for acute infarction. Diffuse loss of parenchymal volume is consistent with atrophy. Patchy low attenuation in the deep hemispheric and periventricular white matter is nonspecific, but likely reflects chronic microvascular ischemic demyelination. No evidence for skull fracture  CT CERVICAL SPINE FINDINGS  Imaging was obtained from the skullbase through the T2 vertebral body. No evidence for fracture. There is diffuse degenerative disc disease. Trace retrolisthesis of C2 on 3 and C3 on 4 and is compatible with the loss of disc height relative to the preservation of facet space. More advanced facet osteoarthritis is seen at C4-5. No prevertebral soft tissue swelling. Pleural-parenchymal scarring is noted in the lung apices  IMPRESSION: No acute intracranial abnormality. There is atrophy with chronic small vessel white matter ischemic disease.  Degenerative changes in the upper cervical spine without evidence for fracture.   Electronically Signed   By: Kennith Center M.D.   On: 03/08/2014 19:26  EKG Interpretation None      MDM   Final diagnoses:  None    Medications  acetaminophen (TYLENOL) suppository 650 mg (not administered)  sodium chloride 0.9 % bolus 1,000 mL (1,000 mLs Intravenous New Bag/Given 03/08/14 1901)   Filed Vitals:   03/08/14 1736 03/08/14 1830 03/08/14 1845 03/08/14 1900  BP:  145/80 148/90 149/84  Pulse:  93 93 91  Temp: 100 F (37.8 C)     TempSrc: Rectal     Resp:  17 18 15   Height:      Weight:      SpO2:  96% 95% 95%   EKG tachycardia with a heart rate of 140 bpm with prolonged QT interval. Troponin negative elevation. CBC negative elevation of white blood cell count. Decrease in hemoglobin noted when compared to approximately 2 weeks  ago-hemoglobin 8.8 today, 2 weeks ago patient's hemoglobin was 12.3. CMP unremarkable - mildly low potassium 3.5. Lactic acid negative elevation. Fecal occult negative. CT head negative for acute intracranial abnormalities. CT cervical spine negative for acute fracture.  Patient started on IV antibiotics - Vanco and Zosyn - for urosepsis while in the ED setting, as well as IV fluids. Cannot rule out cellulitis of the right hand. Patient seen and assessed by attending physician, Dr. Ardeen Jourdain who agrees to plan of admission.  Heart rate has decreased from 140 beats per minute to 94 beats per minute while in ED setting. Vitals have been stable in the ED.   Plain films pending. Urine culture and blood cultures pending. Plan to admit patient. Discussed case with Mora Bellman, PA-C. Transfer of care to Mora Bellman, PA-C at change in shift.   Raymon Mutton, PA-C 03/08/14 2046

## 2014-03-08 NOTE — ED Notes (Signed)
Patient transported to X-ray 

## 2014-03-08 NOTE — ED Provider Notes (Signed)
Patient signed out to me by [place], PA-C at change of shift. Patient was found at home after fall. Hx of cellulitis to right wrist. Took 3 days of Bactrim. Patient has been more tired today. Patient's son last saw patient 3 days ago and he was doing well.  Patient is hemodynamically stable. Appropriate for tele at this point. Discussed with Dr. Adela Glimpse who agrees with admission. Admission is appreciated.   Results for orders placed during the hospital encounter of 03/08/14  LACTIC ACID, PLASMA      Result Value Ref Range   Lactic Acid, Venous 1.4  0.5 - 2.2 mmol/L  CBC WITH DIFFERENTIAL      Result Value Ref Range   WBC 9.3  4.0 - 10.5 K/uL   RBC 2.68 (*) 4.22 - 5.81 MIL/uL   Hemoglobin 8.8 (*) 13.0 - 17.0 g/dL   HCT 16.1 (*) 09.6 - 04.5 %   MCV 95.5  78.0 - 100.0 fL   MCH 32.8  26.0 - 34.0 pg   MCHC 34.4  30.0 - 36.0 g/dL   RDW 40.9  81.1 - 91.4 %   Platelets 203  150 - 400 K/uL   Neutrophils Relative % 79 (*) 43 - 77 %   Neutro Abs 7.4  1.7 - 7.7 K/uL   Lymphocytes Relative 9 (*) 12 - 46 %   Lymphs Abs 0.8  0.7 - 4.0 K/uL   Monocytes Relative 12  3 - 12 %   Monocytes Absolute 1.1 (*) 0.1 - 1.0 K/uL   Eosinophils Relative 0  0 - 5 %   Eosinophils Absolute 0.0  0.0 - 0.7 K/uL   Basophils Relative 0  0 - 1 %   Basophils Absolute 0.0  0.0 - 0.1 K/uL  COMPREHENSIVE METABOLIC PANEL      Result Value Ref Range   Sodium 140  137 - 147 mEq/L   Potassium 3.5 (*) 3.7 - 5.3 mEq/L   Chloride 103  96 - 112 mEq/L   CO2 22  19 - 32 mEq/L   Glucose, Bld 100 (*) 70 - 99 mg/dL   BUN 17  6 - 23 mg/dL   Creatinine, Ser 7.82  0.50 - 1.35 mg/dL   Calcium 8.9  8.4 - 95.6 mg/dL   Total Protein 7.2  6.0 - 8.3 g/dL   Albumin 3.2 (*) 3.5 - 5.2 g/dL   AST 42 (*) 0 - 37 U/L   ALT 14  0 - 53 U/L   Alkaline Phosphatase 69  39 - 117 U/L   Total Bilirubin 1.0  0.3 - 1.2 mg/dL   GFR calc non Af Amer 79 (*) >90 mL/min   GFR calc Af Amer >90  >90 mL/min   Anion gap 15  5 - 15  TROPONIN I      Result  Value Ref Range   Troponin I <0.30  <0.30 ng/mL  URINALYSIS, ROUTINE W REFLEX MICROSCOPIC      Result Value Ref Range   Color, Urine AMBER (*) YELLOW   APPearance TURBID (*) CLEAR   Specific Gravity, Urine 1.018  1.005 - 1.030   pH 6.0  5.0 - 8.0   Glucose, UA NEGATIVE  NEGATIVE mg/dL   Hgb urine dipstick LARGE (*) NEGATIVE   Bilirubin Urine NEGATIVE  NEGATIVE   Ketones, ur NEGATIVE  NEGATIVE mg/dL   Protein, ur 213 (*) NEGATIVE mg/dL   Urobilinogen, UA 1.0  0.0 - 1.0 mg/dL   Nitrite NEGATIVE  NEGATIVE  Leukocytes, UA LARGE (*) NEGATIVE  URINE MICROSCOPIC-ADD ON      Result Value Ref Range   WBC, UA TOO NUMEROUS TO COUNT  <3 WBC/hpf   RBC / HPF 21-50  <3 RBC/hpf   Bacteria, UA FEW (*) RARE  POC OCCULT BLOOD, ED      Result Value Ref Range   Fecal Occult Bld NEGATIVE  NEGATIVE   Dg Chest 2 View  03/08/2014   CLINICAL DATA:  FEVER FALL  EXAM: CHEST - 2 VIEW  COMPARISON:  02/17/2013  FINDINGS: Lungs clear.  Heart size upper limits normal.  No pneumothorax. No effusion. Visualized skeletal structures are unremarkable.  IMPRESSION: No acute cardiopulmonary disease.   Electronically Signed   By: Oley Balm M.D.   On: 03/08/2014 20:24   Dg Pelvis 1-2 Views  03/08/2014   CLINICAL DATA:  fall  EXAM: PELVIS - 1-2 VIEW  COMPARISON:  02/17/2013  FINDINGS: Orthopedic screws across the left femoral neck as before. Bilateral hip osteoarthritis right worse than left. No acute fracture or dislocation. Bony pelvis intact. Increase in size and number of lamellated pelvic calcifications suggesting bladder calculi.  IMPRESSION: 1. Negative for fracture, dislocation, or other acute abnormality. 2. Degenerative and postop changes as above.   Electronically Signed   By: Oley Balm M.D.   On: 03/08/2014 20:26   Dg Shoulder Right  03/08/2014   CLINICAL DATA:  pain  EXAM: RIGHT SHOULDER - 2+ VIEW  COMPARISON:  None.  FINDINGS: There is no evidence of fracture or dislocation. Calcification in soft  tissues near the rotator cuff insertion. No significant osseous degenerative change.  IMPRESSION: 1. Negative for fracture or dislocation. 2. Calcific tendinitis.   Electronically Signed   By: Oley Balm M.D.   On: 03/08/2014 20:23   Dg Elbow Complete Right  03/08/2014   CLINICAL DATA:  pain  EXAM: RIGHT ELBOW - COMPLETE 3+ VIEW  COMPARISON:  None.  FINDINGS: There is no evidence of fracture or dislocation. 6 mm corticated ossicle medial to the medial epicondyle. Mild diffuse chondrocalcinosis. There is not a good lateral to exclude effusion. Soft tissues are unremarkable.  IMPRESSION: 1. No fracture or other acute abnormality. 2. Degenerative changes as above.   Electronically Signed   By: Oley Balm M.D.   On: 03/08/2014 20:22   Dg Wrist Complete Right  03/08/2014   CLINICAL DATA:  hand swelling and hot  EXAM: RIGHT WRIST - COMPLETE 3+ VIEW  COMPARISON:  None.  FINDINGS: There is no evidence of fracture or dislocation. Chondrocalcinosis in the wrist. Heterotopic calcification at the second, third, and fifth MCP joints. Carpal rows intact. Diffuse osteopenia. Soft tissues are unremarkable.  IMPRESSION: 1. Negative for fracture or other acute bone abnormality. 2. Diffuse osteopenia. 3. Extensive chondrocalcinosis suggesting CPPD.   Electronically Signed   By: Oley Balm M.D.   On: 03/08/2014 20:21   Ct Head Wo Contrast  03/08/2014   CLINICAL DATA:  Fall.  Head injury.  EXAM: CT HEAD WITHOUT CONTRAST  CT CERVICAL SPINE WITHOUT CONTRAST  TECHNIQUE: Multidetector CT imaging of the head and cervical spine was performed following the standard protocol without intravenous contrast. Multiplanar CT image reconstructions of the cervical spine were also generated.  COMPARISON:  Head CT from 02/17/2013  FINDINGS: CT HEAD FINDINGS  There is no evidence for acute hemorrhage, hydrocephalus, mass lesion, or abnormal extra-axial fluid collection. No definite CT evidence for acute infarction. Diffuse loss of  parenchymal volume is consistent with atrophy. Patchy  low attenuation in the deep hemispheric and periventricular white matter is nonspecific, but likely reflects chronic microvascular ischemic demyelination. No evidence for skull fracture  CT CERVICAL SPINE FINDINGS  Imaging was obtained from the skullbase through the T2 vertebral body. No evidence for fracture. There is diffuse degenerative disc disease. Trace retrolisthesis of C2 on 3 and C3 on 4 and is compatible with the loss of disc height relative to the preservation of facet space. More advanced facet osteoarthritis is seen at C4-5. No prevertebral soft tissue swelling. Pleural-parenchymal scarring is noted in the lung apices  IMPRESSION: No acute intracranial abnormality. There is atrophy with chronic small vessel white matter ischemic disease.  Degenerative changes in the upper cervical spine without evidence for fracture.   Electronically Signed   By: Kennith Center M.D.   On: 03/08/2014 19:26   Ct Cervical Spine Wo Contrast  03/08/2014   CLINICAL DATA:  Fall.  Head injury.  EXAM: CT HEAD WITHOUT CONTRAST  CT CERVICAL SPINE WITHOUT CONTRAST  TECHNIQUE: Multidetector CT imaging of the head and cervical spine was performed following the standard protocol without intravenous contrast. Multiplanar CT image reconstructions of the cervical spine were also generated.  COMPARISON:  Head CT from 02/17/2013  FINDINGS: CT HEAD FINDINGS  There is no evidence for acute hemorrhage, hydrocephalus, mass lesion, or abnormal extra-axial fluid collection. No definite CT evidence for acute infarction. Diffuse loss of parenchymal volume is consistent with atrophy. Patchy low attenuation in the deep hemispheric and periventricular white matter is nonspecific, but likely reflects chronic microvascular ischemic demyelination. No evidence for skull fracture  CT CERVICAL SPINE FINDINGS  Imaging was obtained from the skullbase through the T2 vertebral body. No evidence for  fracture. There is diffuse degenerative disc disease. Trace retrolisthesis of C2 on 3 and C3 on 4 and is compatible with the loss of disc height relative to the preservation of facet space. More advanced facet osteoarthritis is seen at C4-5. No prevertebral soft tissue swelling. Pleural-parenchymal scarring is noted in the lung apices  IMPRESSION: No acute intracranial abnormality. There is atrophy with chronic small vessel white matter ischemic disease.  Degenerative changes in the upper cervical spine without evidence for fracture.   Electronically Signed   By: Kennith Center M.D.   On: 03/08/2014 19:26   Dg Abd 2 Views  03/08/2014   CLINICAL DATA:  FEVER FALL  EXAM: ABDOMEN - 2 VIEW  COMPARISON:  02/17/2013 and earlier studies  FINDINGS: Normal bowel gas pattern. No free air. Visualized lung bases clear. Bilateral hip osteoarthritis right worse than left. Orthopedic screws across the left femoral neck. Bladder calculi.  IMPRESSION: 1. No acute abnormality. 2. Degenerative and postop changes as above.   Electronically Signed   By: Oley Balm M.D.   On: 03/08/2014 20:27   Dg Hand Complete Right  03/08/2014   CLINICAL DATA:  Redness and swelling with pain  EXAM: RIGHT HAND - COMPLETE 3+ VIEW  COMPARISON:  None.  FINDINGS: No acute fracture or dislocation is noted. Generalized soft tissue swelling is noted. Diffuse periarticular calcifications are noted particularly at the MCP joints which may represent gouty tophi.  IMPRESSION: No acute fracture or dislocation is noted. Soft tissue periarticular calcifications are noted which may be related to gout.   Electronically Signed   By: Alcide Clever M.D.   On: 03/08/2014 20:21     Mora Bellman, PA-C 03/08/14 2057

## 2014-03-08 NOTE — ED Notes (Signed)
Bed: ZO10 Expected date:  Expected time:  Means of arrival:  Comments: Fall, fever

## 2014-03-08 NOTE — ED Notes (Signed)
Per EMS: Pt found at home next to caregiver after falling.  Pt reports taking antibiotics but does not know why he is taking them.  Pt had 20 pills left according to EMS. Pts right hand is red and swollen and hot to the touch.  Vitals: 149/89, HR 106, R16, spo2 99 on 3L, temp 103.8

## 2014-03-09 DIAGNOSIS — E43 Unspecified severe protein-calorie malnutrition: Secondary | ICD-10-CM | POA: Diagnosis present

## 2014-03-09 DIAGNOSIS — I5032 Chronic diastolic (congestive) heart failure: Secondary | ICD-10-CM

## 2014-03-09 DIAGNOSIS — M7989 Other specified soft tissue disorders: Secondary | ICD-10-CM

## 2014-03-09 DIAGNOSIS — I509 Heart failure, unspecified: Secondary | ICD-10-CM

## 2014-03-09 DIAGNOSIS — M064 Inflammatory polyarthropathy: Secondary | ICD-10-CM

## 2014-03-09 DIAGNOSIS — M1990 Unspecified osteoarthritis, unspecified site: Secondary | ICD-10-CM | POA: Diagnosis present

## 2014-03-09 LAB — CBC
HEMATOCRIT: 37.5 % — AB (ref 39.0–52.0)
Hemoglobin: 12.6 g/dL — ABNORMAL LOW (ref 13.0–17.0)
MCH: 32.1 pg (ref 26.0–34.0)
MCHC: 33.6 g/dL (ref 30.0–36.0)
MCV: 95.7 fL (ref 78.0–100.0)
Platelets: 156 10*3/uL (ref 150–400)
RBC: 3.92 MIL/uL — AB (ref 4.22–5.81)
RDW: 13.5 % (ref 11.5–15.5)
WBC: 8.5 10*3/uL (ref 4.0–10.5)

## 2014-03-09 LAB — OCCULT BLOOD X 1 CARD TO LAB, STOOL: Fecal Occult Bld: NEGATIVE

## 2014-03-09 LAB — COMPREHENSIVE METABOLIC PANEL
ALBUMIN: 3.2 g/dL — AB (ref 3.5–5.2)
ALT: 43 U/L (ref 0–53)
AST: 163 U/L — AB (ref 0–37)
Alkaline Phosphatase: 69 U/L (ref 39–117)
Anion gap: 15 (ref 5–15)
BUN: 16 mg/dL (ref 6–23)
CALCIUM: 9.3 mg/dL (ref 8.4–10.5)
CHLORIDE: 105 meq/L (ref 96–112)
CO2: 22 mEq/L (ref 19–32)
Creatinine, Ser: 0.73 mg/dL (ref 0.50–1.35)
GFR calc Af Amer: >90 mL/min (ref >90)
GFR calc non Af Amer: 82 mL/min — ABNORMAL LOW (ref >90)
Glucose, Bld: 97 mg/dL (ref 70–99)
Potassium: 3.9 mEq/L (ref 3.7–5.3)
Sodium: 142 mEq/L (ref 137–147)
Total Bilirubin: 1.2 mg/dL (ref 0.3–1.2)
Total Protein: 7.5 g/dL (ref 6.0–8.3)

## 2014-03-09 LAB — D-DIMER, QUANTITATIVE (NOT AT ARMC): D-Dimer, Quant: 2.09 ug/mL-FEU — ABNORMAL HIGH (ref 0.00–0.48)

## 2014-03-09 LAB — VITAMIN B12: Vitamin B-12: 494 pg/mL (ref 211–911)

## 2014-03-09 LAB — TROPONIN I
Troponin I: 0.43 ng/mL (ref <0.30)
Troponin I: 0.41 ng/mL (ref <0.30)

## 2014-03-09 LAB — MAGNESIUM: Magnesium: 2.1 mg/dL (ref 1.5–2.5)

## 2014-03-09 LAB — PREALBUMIN: Prealbumin: 12.5 mg/dL — ABNORMAL LOW (ref 17.0–34.0)

## 2014-03-09 LAB — ABO/RH: ABO/RH(D): B POS

## 2014-03-09 LAB — IRON AND TIBC
IRON: 16 ug/dL — AB (ref 42–135)
Saturation Ratios: 7 % — ABNORMAL LOW (ref 20–55)
TIBC: 241 ug/dL (ref 215–435)
UIBC: 225 ug/dL (ref 125–400)

## 2014-03-09 LAB — FOLATE: FOLATE: 17.6 ng/mL

## 2014-03-09 LAB — PHOSPHORUS: PHOSPHORUS: 2.6 mg/dL (ref 2.3–4.6)

## 2014-03-09 LAB — SEDIMENTATION RATE: SED RATE: 62 mm/h — AB (ref 0–16)

## 2014-03-09 LAB — HEMOGLOBIN A1C
HEMOGLOBIN A1C: 5.1 % (ref <5.7)
Mean Plasma Glucose: 100 mg/dL (ref <117)

## 2014-03-09 LAB — PRO B NATRIURETIC PEPTIDE: PRO B NATRI PEPTIDE: 3640 pg/mL — AB (ref 0–450)

## 2014-03-09 LAB — FERRITIN: Ferritin: 290 ng/mL (ref 22–322)

## 2014-03-09 LAB — URIC ACID: Uric Acid, Serum: 4.8 mg/dL (ref 4.0–7.8)

## 2014-03-09 LAB — TSH: TSH: 3.28 u[IU]/mL (ref 0.350–4.500)

## 2014-03-09 MED ORDER — TAMSULOSIN HCL 0.4 MG PO CAPS
0.4000 mg | ORAL_CAPSULE | Freq: Every day | ORAL | Status: DC
  Administered 2014-03-09 – 2014-03-14 (×3): 0.4 mg via ORAL
  Filled 2014-03-09 (×6): qty 1

## 2014-03-09 MED ORDER — ACETAMINOPHEN 325 MG PO TABS: 650.0000 mg | ORAL_TABLET | Freq: Four times a day (QID) | ORAL | Status: DC | PRN

## 2014-03-09 MED ORDER — POLYETHYLENE GLYCOL 3350 17 G PO PACK
17.0000 g | PACK | Freq: Every day | ORAL | Status: DC | PRN
  Filled 2014-03-09: qty 1

## 2014-03-09 MED ORDER — FUROSEMIDE 10 MG/ML IJ SOLN
20.0000 mg | Freq: Two times a day (BID) | INTRAMUSCULAR | Status: DC
  Administered 2014-03-09 – 2014-03-13 (×8): 20 mg via INTRAVENOUS
  Filled 2014-03-09 (×11): qty 2

## 2014-03-09 MED ORDER — POLYETHYLENE GLYCOL 3350 17 GM/SCOOP PO POWD
17.0000 g | Freq: Every day | ORAL | Status: DC | PRN
  Filled 2014-03-09: qty 255

## 2014-03-09 MED ORDER — ACETAMINOPHEN 650 MG RE SUPP: 650.0000 mg | Freq: Four times a day (QID) | RECTAL | Status: DC | PRN

## 2014-03-09 MED ORDER — TRAMADOL HCL 50 MG PO TABS: 50.0000 mg | ORAL_TABLET | Freq: Two times a day (BID) | ORAL | Status: DC | PRN

## 2014-03-09 MED ORDER — RAMIPRIL 1.25 MG PO CAPS
1.2500 mg | ORAL_CAPSULE | Freq: Every day | ORAL | Status: DC
  Administered 2014-03-09 – 2014-03-14 (×3): 1.25 mg via ORAL
  Filled 2014-03-09 (×7): qty 1

## 2014-03-09 MED ORDER — LORAZEPAM 2 MG/ML IJ SOLN
0.5000 mg | INTRAMUSCULAR | Status: DC | PRN
  Administered 2014-03-09 – 2014-03-13 (×4): 0.5 mg via INTRAVENOUS
  Filled 2014-03-09 (×4): qty 1

## 2014-03-09 MED ORDER — DARIFENACIN HYDROBROMIDE ER 7.5 MG PO TB24
7.5000 mg | ORAL_TABLET | Freq: Every day | ORAL | Status: DC
  Administered 2014-03-09 – 2014-03-14 (×3): 7.5 mg via ORAL
  Filled 2014-03-09 (×6): qty 1

## 2014-03-09 MED ORDER — SODIUM CHLORIDE 0.9 % IV SOLN
INTRAVENOUS | Status: AC
  Administered 2014-03-09: 05:00:00 via INTRAVENOUS

## 2014-03-09 MED ORDER — MORPHINE SULFATE 2 MG/ML IJ SOLN
1.0000 mg | Freq: Once | INTRAMUSCULAR | Status: AC
  Administered 2014-03-09: 1 mg via INTRAVENOUS
  Filled 2014-03-09: qty 1

## 2014-03-09 MED ORDER — DOCUSATE SODIUM 100 MG PO CAPS
100.0000 mg | ORAL_CAPSULE | Freq: Two times a day (BID) | ORAL | Status: DC
  Administered 2014-03-09 – 2014-03-14 (×5): 100 mg via ORAL
  Filled 2014-03-09 (×12): qty 1

## 2014-03-09 MED ORDER — ENSURE COMPLETE PO LIQD
237.0000 mL | Freq: Two times a day (BID) | ORAL | Status: DC
  Administered 2014-03-09 – 2014-03-14 (×6): 237 mL via ORAL

## 2014-03-09 MED ORDER — ONDANSETRON HCL 4 MG/2ML IJ SOLN: 4.0000 mg | Freq: Four times a day (QID) | INTRAMUSCULAR | Status: DC | PRN

## 2014-03-09 MED ORDER — ONDANSETRON HCL 4 MG PO TABS: 4.0000 mg | ORAL_TABLET | Freq: Four times a day (QID) | ORAL | Status: DC | PRN

## 2014-03-09 MED ORDER — HYDROCODONE-ACETAMINOPHEN 5-325 MG PO TABS: 1.0000 | ORAL_TABLET | ORAL | Status: DC | PRN

## 2014-03-09 MED ORDER — HYDRALAZINE HCL 20 MG/ML IJ SOLN
5.0000 mg | Freq: Four times a day (QID) | INTRAMUSCULAR | Status: DC | PRN
  Administered 2014-03-09: 5 mg via INTRAVENOUS
  Filled 2014-03-09: qty 1

## 2014-03-09 MED ORDER — SODIUM CHLORIDE 0.9 % IJ SOLN
3.0000 mL | Freq: Two times a day (BID) | INTRAMUSCULAR | Status: DC
  Administered 2014-03-09 – 2014-03-11 (×5): 3 mL via INTRAVENOUS

## 2014-03-09 MED ORDER — DILTIAZEM HCL 25 MG/5ML IV SOLN
10.0000 mg | Freq: Once | INTRAVENOUS | Status: AC
  Administered 2014-03-09: 10 mg via INTRAVENOUS
  Filled 2014-03-09: qty 5

## 2014-03-09 NOTE — Progress Notes (Signed)
Reported to Clinical research associate pt c/o pain radiating in right arm. On arrival to room pt denies any chest pain, only pain to right arm. Declines to have pain medication stating it knocks him out. STAT ECG, vitals obtained, and MD notified. Pt resting comfortably in bed at this time. No s/s of distress at this time.

## 2014-03-09 NOTE — Progress Notes (Signed)
Echocardiogram 2D Echocardiogram has been performed.  Rilen Shukla 03/09/2014, 10:58 AM

## 2014-03-09 NOTE — ED Provider Notes (Signed)
Medical screening examination/treatment/procedure(s) were conducted as a shared visit with non-physician practitioner(s) and myself.  I personally evaluated the patient during the encounter.   EKG Interpretation None      Pt with altered MS, temp 100, probably urosepsis with ?cellulitis of wrist.  Plan broad spectum abx, admit, fluids  Rolan Bucco, MD 03/09/14 0041

## 2014-03-09 NOTE — Progress Notes (Signed)
*  PRELIMINARY RESULTS* Vascular Ultrasound Lower extremity venous duplex has been completed.  Preliminary findings: no evidence of DVT.  Farrel Demark, RDMS, RVT  03/09/2014, 3:31 PM

## 2014-03-09 NOTE — ED Provider Notes (Signed)
Medical screening examination/treatment/procedure(s) were performed by non-physician practitioner and as supervising physician I was immediately available for consultation/collaboration.   EKG Interpretation None        Gust Eugene, MD 03/09/14 0040 

## 2014-03-09 NOTE — Progress Notes (Signed)
INITIAL NUTRITION ASSESSMENT  DOCUMENTATION CODES Per approved criteria  -Severe malnutrition in the context of chronic illness  Pt meets criteria for severe MALNUTRITION in the context of chronic illness as evidenced by severe muscle wasting and subcutaneous fat loss.   INTERVENTION: -Consider liberalizing diet to encourage PO intake -Recommend Ensure Complete po BID, each supplement provides 350 kcal and 13 grams of protein -Assisted in meal ordering -Will continue to monitor  NUTRITION DIAGNOSIS: Inadequate oral intake related to decreased appetite as evidenced by PO intake < 75%, wt loss.   Goal: Pt to meet >/= 90% of their estimated nutrition needs    Monitor:  Total protein/energy intake, labs, weights, diet order  Reason for Assessment: MST/Consult to Assess  80 y.o. male  Admitting Dx: <principal problem not specified>  ASSESSMENT: Patient was found down on the the floor. The caretaker was out at the time. He was noted to be somewhat more somnolent. On arrival he was tachycrdic up to 140's Temperature up to 100. 2 weeks ago he was treated with bactrim for possible cellulitis of right wrist. He took 3 doses and got better but became swollen again today. No Hx of gout.   -Pt reported overall decreased appetite, was unable to determine when it began. Noted some feelings of nausea. Has also experienced subsequent weight loss; however pt was unable to quantify amount -Diet recall indicated pt consuming two meals daily, typically of softer foods d/t lack of dentition. Meats/proteins have to be chopped up for pt to tolerate. -Will drink Ensure or Boost occasionally. Was willing to continue with supplement regimen during admit.  -Had not yet ordered breakfast during time of RD assessment, assisted in ordering, and encouraged intake of soft protein foods -Consider diet liberalization to encourage PO Nutrition Focused Physical Exam:  Subcutaneous Fat:  Orbital Region: moderate  wasting Upper Arm Region: severe Thoracic and Lumbar Region: severe  Muscle:  Temple Region: severe Clavicle Bone Region: severe Clavicle and Acromion Bone Region: severe Scapular Bone Region: severe Dorsal Hand: severe Patellar Region: severe Anterior Thigh Region: edema Posterior Calf Region: edema  Edema: +2RUE, +3 RLE, +2 LLE edemas    Height: Ht Readings from Last 1 Encounters:  03/08/14  (1.702 m)    Weight: Wt Readings from Last 1 Encounters:  03/08/14 135 lb 9.6 oz (61.508 kg)    Ideal Body Weight: 148 lbs  % Ideal Body Weight: 91%  Wt Readings from Last 10 Encounters:  03/08/14 135 lb 9.6 oz (61.508 kg)  02/23/14 136 lb (61.689 kg)  02/20/14 138 lb (62.596 kg)  02/16/14 138 lb (62.596 kg)  11/09/13 160 lb (72.576 kg)  05/11/13 147 lb (66.679 kg)  04/15/13 137 lb (62.143 kg)  04/08/13 137 lb (62.143 kg)  04/04/13 138 lb (62.596 kg)  03/23/13 139 lb (63.05 kg)    Usual Body Weight: unable to determine  % Usual Body Weight: unable to determine  BMI:  Body mass index is 21.23 kg/(m^2).  Estimated Nutritional Needs: Kcal: 1700-1900 Protein: 75-85 gram Fluid: >/=1700 ml/daily  Skin: +2 RUE edema, +3 RLE edema, +2 LLE edema  Diet Order: Cardiac  EDUCATION NEEDS: -No education needs identified at this time   Intake/Output Summary (Last 24 hours) at 03/09/14 1049 Last data filed at 03/09/14 1039  Gross per 24 hour  Intake    360 ml  Output   1175 ml  Net   -815 ml    Last BM: pta   Labs:   Recent Labs  Lab 03/08/14 1759 03/09/14 0315  NA 140 142  K 3.5* 3.9  CL 103 105  CO2 22 22  BUN 17 16  CREATININE 0.78 0.73  CALCIUM 8.9 9.3  MG  --  2.1  PHOS  --  2.6  GLUCOSE 100* 97    CBG (last 3)  No results found for this basename: GLUCAP,  in the last 72 hours  Scheduled Meds: . colchicine  0.6 mg Oral BID  . darifenacin  7.5 mg Oral Daily  . docusate sodium  100 mg Oral BID  . feeding supplement (ENSURE COMPLETE)  237  mL Oral BID BM  . piperacillin-tazobactam (ZOSYN)  IV  3.375 g Intravenous Q8H  . sodium chloride  3 mL Intravenous Q12H  . tamsulosin  0.4 mg Oral Daily  . vancomycin  500 mg Intravenous Q12H    Continuous Infusions: . sodium chloride 75 mL/hr at 03/09/14 0459    Past Medical History  Diagnosis Date  . BPH (benign prostatic hyperplasia)   . GERD (gastroesophageal reflux disease)   . Hyperlipidemia   . Fracture of femoral neck, left 02/17/2013    Past Surgical History  Procedure Laterality Date  . Colon surgery    . Percutaneous pinning Left 02/17/2013    Procedure: PERCUTANEOUS PINNING HIP;  Surgeon: Eulas Post, MD;  Location: [facility] OR;  Service: Orthopedics;  Laterality: Left;    Lloyd Huger MS RD LDN Clinical Dietitian Pager:[phone]

## 2014-03-09 NOTE — Progress Notes (Signed)
PROGRESS NOTE  [name] ZOX:[id number] DOB: [birth year] DOA: 03/08/2014 PCP: Leo Grosser, MD  HPI/Recap of past 57 hours: 80 year old male with past medical history of BPH, protein calorie malnutrition and recent swelling of right wrist treated with Bactrim for possible cellulitis from home with caretaker was found down on the floor after caretaker had stepped out. He likely had been down for no more than a few hours. In the emergency room, patient had signs consistent with sepsis felt to be secondary to UTI and also possible cellulitis of wrist. He was started on IV fluids and antibiotics and admitted to the hospitalist service. His hemoglobin was noted to be 8.8 on admission with previous hemoglobins being in the 11-12 range.  Overnight, no issues. Seen by hand surgery who recommended close monitoring. They felt that he had a right wrist inflammatory arthropathy consistent with CPPD versus gout. They recommended wrist immobilization and no further imaging or aspiration. On admission, initial troponin level normal but 6 hours later increased to 0.4. Followup troponin 6 hours after that also unchanged at 6.4  Assessment/Plan: Active Problems:   BPH (benign prostatic hyperplasia): Stable, patient continued on Flomax   Inflammatory arthropathy: Seen by hand surgery. As above, stable his hand. No further imaging or aspiration of fluid needed at this time. Doubt versus CPPD    Anemia: Curiously, repeat hemoglobin this morning was 12.6, much more consistent with patient's    UTI (lower urinary tract infection): IV Rocephin, awaiting urine cultures    Hypokalemia: Secondary dehydration. Mild. Followup potassium level this morning resolved    Sepsis: Stabilized. Patient criteria given elevated temperature, tachycardia and urine as source on admission. Pressures are much improved and his heart rate is normalized. White count staying normal.    Protein-calorie malnutrition, severe: Patient  is criteria in the context of chronic illness as evidenced by severe muscle wasting and subcutaneous fat loss. Seen by nutrition. Started on Ensure complete by mouth twice a day    Chronic diastolic CHF (congestive heart failure): Incidentally found on echocardiogram. Check BNP.  Elevated troponin I: Could be from patient's heart failure if it is elevated. Although on note, initial level was normal. He may have some mild demand ischemia secondary to underlying sepsis. Treatment of which would be treating the underlying problem. Continue to monitor troponin levels  Code Status: Full code  Family Communication: Left message with family  Disposition Plan: Here for several days. Need to ensure he is not in active heart failure. Awaiting urine cultures. Need to confirm troponin levels normalized. Physical therapy to see as well   Consultants:  None  Procedures:  Echocardiogram done 9/3: Grade 1 diastolic dysfunction  Antibiotics:  IV Rocephin 9/2-present  HPI/Subjective: Patient doing okay. Difficult to understand, complains of right wrist pain  Objective: BP 156/97  Pulse 111  Temp(Src) 100.4 F (38 C) (Oral)  Resp 16  Ht  (1.702 m)  Wt 61.508 kg (135 lb 9.6 oz)  BMI 21.23 kg/m2  SpO2 98%  Intake/Output Summary (Last 24 hours) at 03/09/14 1410 Last data filed at 03/09/14 1039  Gross per 24 hour  Intake    360 ml  Output   1175 ml  Net   -815 ml   Filed Weights   03/08/14 1702 03/08/14 2220  Weight: 63.504 kg (140 lb) 61.508 kg (135 lb 9.6 oz)    Exam:   General:  Alert and oriented x2, no acute distress  Cardiovascular: Regular rate and rhythm, S1-S2, soft 2/6  systolic ejection murmur  Respiratory: Clear to auscultation bilaterally  Abdomen: Soft, nontender, nondistended, positive bowel sounds  Musculoskeletal: Right wrist is swollen and somewhat erythematous mildly tender.   Data Reviewed: Basic Metabolic Panel:  Recent Labs Lab 03/08/14 1759  03/09/14 0315  NA 140 142  K 3.5* 3.9  CL 103 105  CO2 22 22  GLUCOSE 100* 97  BUN 17 16  CREATININE 0.78 0.73  CALCIUM 8.9 9.3  MG  --  2.1  PHOS  --  2.6   Liver Function Tests:  Recent Labs Lab 03/08/14 1759 03/09/14 0315  AST 42* 163*  ALT 14 43  ALKPHOS 69 69  BILITOT 1.0 1.2  PROT 7.2 7.5  ALBUMIN 3.2* 3.2*   No results found for this basename: LIPASE, AMYLASE,  in the last 168 hours No results found for this basename: AMMONIA,  in the last 168 hours CBC:  Recent Labs Lab 03/08/14 1759 03/09/14 0315  WBC 9.3 8.5  NEUTROABS 7.4  --   HGB 8.8* 12.6*  HCT 25.6* 37.5*  MCV 95.5 95.7  PLT 203 156   Cardiac Enzymes:    Recent Labs Lab 03/08/14 1759 03/09/14 0315 03/09/14 0900  TROPONINI <0.30 0.43* 0.41*   BNP (last 3 results) No results found for this basename: PROBNP,  in the last 8760 hours CBG: No results found for this basename: GLUCAP,  in the last 168 hours  Recent Results (from the past 240 hour(s))  CULTURE, BLOOD (ROUTINE X 2)     Status: None   Collection Time    03/08/14  6:24 PM      Result Value Ref Range Status   Specimen Description BLOOD BLOOD LEFT FOREARM   Final   Special Requests BOTTLES DRAWN AEROBIC AND ANAEROBIC 5CC   Final   Culture  Setup Time     Final   Value: 03/08/2014 23:22     Performed at [facility]   Culture     Final   Value:        BLOOD CULTURE RECEIVED NO GROWTH TO DATE CULTURE WILL BE HELD FOR 5 DAYS BEFORE ISSUING A FINAL NEGATIVE REPORT     Performed at [facility]   Report Status PENDING   Incomplete  CULTURE, BLOOD (ROUTINE X 2)     Status: None   Collection Time    03/08/14  6:29 PM      Result Value Ref Range Status   Specimen Description BLOOD BLOOD RIGHT FOREARM   Final   Special Requests BOTTLES DRAWN AEROBIC AND ANAEROBIC 5CC   Final   Culture  Setup Time     Final   Value: 03/08/2014 23:22     Performed at [facility]   Culture     Final   Value:        BLOOD  CULTURE RECEIVED NO GROWTH TO DATE CULTURE WILL BE HELD FOR 5 DAYS BEFORE ISSUING A FINAL NEGATIVE REPORT     Performed at [facility]   Report Status PENDING   Incomplete     Studies: Dg Chest 2 View  03/08/2014   CLINICAL DATA:  FEVER FALL  EXAM: CHEST - 2 VIEW  COMPARISON:  02/17/2013  FINDINGS: Lungs clear.  Heart size upper limits normal.  No pneumothorax. No effusion. Visualized skeletal structures are unremarkable.  IMPRESSION: No acute cardiopulmonary disease.   Electronically Signed   By: Oley Balm M.D.   On: 03/08/2014 20:24   Dg Pelvis  1-2 Views  03/08/2014   CLINICAL DATA:  fall  EXAM: PELVIS - 1-2 VIEW  COMPARISON:  02/17/2013  FINDINGS: Orthopedic screws across the left femoral neck as before. Bilateral hip osteoarthritis right worse than left. No acute fracture or dislocation. Bony pelvis intact. Increase in size and number of lamellated pelvic calcifications suggesting bladder calculi.  IMPRESSION: 1. Negative for fracture, dislocation, or other acute abnormality. 2. Degenerative and postop changes as above.   Electronically Signed   By: Oley Balm M.D.   On: 03/08/2014 20:26   Dg Shoulder Right  03/08/2014   CLINICAL DATA:  pain  EXAM: RIGHT SHOULDER - 2+ VIEW  COMPARISON:  None.  FINDINGS: There is no evidence of fracture or dislocation. Calcification in soft tissues near the rotator cuff insertion. No significant osseous degenerative change.  IMPRESSION: 1. Negative for fracture or dislocation. 2. Calcific tendinitis.   Electronically Signed   By: Oley Balm M.D.   On: 03/08/2014 20:23   Dg Elbow Complete Right  03/08/2014   CLINICAL DATA:  pain  EXAM: RIGHT ELBOW - COMPLETE 3+ VIEW  COMPARISON:  None.  FINDINGS: There is no evidence of fracture or dislocation. 6 mm corticated ossicle medial to the medial epicondyle. Mild diffuse chondrocalcinosis. There is not a good lateral to exclude effusion. Soft tissues are unremarkable.  IMPRESSION: 1. No fracture or  other acute abnormality. 2. Degenerative changes as above.   Electronically Signed   By: Oley Balm M.D.   On: 03/08/2014 20:22   Dg Wrist Complete Right  03/08/2014   CLINICAL DATA:  hand swelling and hot  EXAM: RIGHT WRIST - COMPLETE 3+ VIEW  COMPARISON:  None.  FINDINGS: There is no evidence of fracture or dislocation. Chondrocalcinosis in the wrist. Heterotopic calcification at the second, third, and fifth MCP joints. Carpal rows intact. Diffuse osteopenia. Soft tissues are unremarkable.  IMPRESSION: 1. Negative for fracture or other acute bone abnormality. 2. Diffuse osteopenia. 3. Extensive chondrocalcinosis suggesting CPPD.   Electronically Signed   By: Oley Balm M.D.   On: 03/08/2014 20:21   Ct Head Wo Contrast  03/08/2014   CLINICAL DATA:  Fall.  Head injury.  EXAM: CT HEAD WITHOUT CONTRAST  CT CERVICAL SPINE WITHOUT CONTRAST  TECHNIQUE: Multidetector CT imaging of the head and cervical spine was performed following the standard protocol without intravenous contrast. Multiplanar CT image reconstructions of the cervical spine were also generated.  COMPARISON:  Head CT from 02/17/2013  FINDINGS: CT HEAD FINDINGS  There is no evidence for acute hemorrhage, hydrocephalus, mass lesion, or abnormal extra-axial fluid collection. No definite CT evidence for acute infarction. Diffuse loss of parenchymal volume is consistent with atrophy. Patchy low attenuation in the deep hemispheric and periventricular white matter is nonspecific, but likely reflects chronic microvascular ischemic demyelination. No evidence for skull fracture  CT CERVICAL SPINE FINDINGS  Imaging was obtained from the skullbase through the T2 vertebral body. No evidence for fracture. There is diffuse degenerative disc disease. Trace retrolisthesis of C2 on 3 and C3 on 4 and is compatible with the loss of disc height relative to the preservation of facet space. More advanced facet osteoarthritis is seen at C4-5. No prevertebral soft  tissue swelling. Pleural-parenchymal scarring is noted in the lung apices  IMPRESSION: No acute intracranial abnormality. There is atrophy with chronic small vessel white matter ischemic disease.  Degenerative changes in the upper cervical spine without evidence for fracture.   Electronically Signed   By: Jamison Oka.D.  On: 03/08/2014 19:26   Ct Cervical Spine Wo Contrast  03/08/2014   CLINICAL DATA:  Fall.  Head injury.  EXAM: CT HEAD WITHOUT CONTRAST  CT CERVICAL SPINE WITHOUT CONTRAST  TECHNIQUE: Multidetector CT imaging of the head and cervical spine was performed following the standard protocol without intravenous contrast. Multiplanar CT image reconstructions of the cervical spine were also generated.  COMPARISON:  Head CT from 02/17/2013  FINDINGS: CT HEAD FINDINGS  There is no evidence for acute hemorrhage, hydrocephalus, mass lesion, or abnormal extra-axial fluid collection. No definite CT evidence for acute infarction. Diffuse loss of parenchymal volume is consistent with atrophy. Patchy low attenuation in the deep hemispheric and periventricular white matter is nonspecific, but likely reflects chronic microvascular ischemic demyelination. No evidence for skull fracture  CT CERVICAL SPINE FINDINGS  Imaging was obtained from the skullbase through the T2 vertebral body. No evidence for fracture. There is diffuse degenerative disc disease. Trace retrolisthesis of C2 on 3 and C3 on 4 and is compatible with the loss of disc height relative to the preservation of facet space. More advanced facet osteoarthritis is seen at C4-5. No prevertebral soft tissue swelling. Pleural-parenchymal scarring is noted in the lung apices  IMPRESSION: No acute intracranial abnormality. There is atrophy with chronic small vessel white matter ischemic disease.  Degenerative changes in the upper cervical spine without evidence for fracture.   Electronically Signed   By: Kennith Center M.D.   On: 03/08/2014 19:26   Dg Abd 2  Views  03/08/2014   CLINICAL DATA:  FEVER FALL  EXAM: ABDOMEN - 2 VIEW  COMPARISON:  02/17/2013 and earlier studies  FINDINGS: Normal bowel gas pattern. No free air. Visualized lung bases clear. Bilateral hip osteoarthritis right worse than left. Orthopedic screws across the left femoral neck. Bladder calculi.  IMPRESSION: 1. No acute abnormality. 2. Degenerative and postop changes as above.   Electronically Signed   By: Oley Balm M.D.   On: 03/08/2014 20:27   Dg Hand Complete Right  03/08/2014   CLINICAL DATA:  Redness and swelling with pain  EXAM: RIGHT HAND - COMPLETE 3+ VIEW  COMPARISON:  None.  FINDINGS: No acute fracture or dislocation is noted. Generalized soft tissue swelling is noted. Diffuse periarticular calcifications are noted particularly at the MCP joints which may represent gouty tophi.  IMPRESSION: No acute fracture or dislocation is noted. Soft tissue periarticular calcifications are noted which may be related to gout.   Electronically Signed   By: Alcide Clever M.D.   On: 03/08/2014 20:21    Scheduled Meds: . colchicine  0.6 mg Oral BID  . darifenacin  7.5 mg Oral Daily  . docusate sodium  100 mg Oral BID  . feeding supplement (ENSURE COMPLETE)  237 mL Oral BID BM  . piperacillin-tazobactam (ZOSYN)  IV  3.375 g Intravenous Q8H  . sodium chloride  3 mL Intravenous Q12H  . tamsulosin  0.4 mg Oral Daily  . vancomycin  500 mg Intravenous Q12H    Continuous Infusions:    Time spent: 35 minutes  Hollice Espy  [facility] Pager [phone]. If 7PM-7AM, please contact night-coverage at [email or web address], password [facility] 03/09/2014, 2:10 PM  LOS: 1 day

## 2014-03-10 DIAGNOSIS — I5033 Acute on chronic diastolic (congestive) heart failure: Secondary | ICD-10-CM | POA: Diagnosis present

## 2014-03-10 DIAGNOSIS — R1310 Dysphagia, unspecified: Secondary | ICD-10-CM | POA: Diagnosis present

## 2014-03-10 LAB — URINALYSIS, ROUTINE W REFLEX MICROSCOPIC
Bilirubin Urine: NEGATIVE
Glucose, UA: NEGATIVE mg/dL
KETONES UR: NEGATIVE mg/dL
NITRITE: NEGATIVE
PH: 6 (ref 5.0–8.0)
Protein, ur: NEGATIVE mg/dL
SPECIFIC GRAVITY, URINE: 1.009 (ref 1.005–1.030)
Urobilinogen, UA: 0.2 mg/dL (ref 0.0–1.0)

## 2014-03-10 LAB — BASIC METABOLIC PANEL
ANION GAP: 12 (ref 5–15)
BUN: 19 mg/dL (ref 6–23)
CHLORIDE: 107 meq/L (ref 96–112)
CO2: 25 meq/L (ref 19–32)
Calcium: 9.2 mg/dL (ref 8.4–10.5)
Creatinine, Ser: 0.91 mg/dL (ref 0.50–1.35)
GFR calc Af Amer: 86 mL/min — ABNORMAL LOW (ref >90)
GFR calc non Af Amer: 75 mL/min — ABNORMAL LOW (ref >90)
Glucose, Bld: 113 mg/dL — ABNORMAL HIGH (ref 70–99)
Potassium: 3.4 mEq/L — ABNORMAL LOW (ref 3.7–5.3)
SODIUM: 144 meq/L (ref 137–147)

## 2014-03-10 LAB — URINE MICROSCOPIC-ADD ON

## 2014-03-10 LAB — URINE CULTURE
COLONY COUNT: NO GROWTH
CULTURE: NO GROWTH
Special Requests: NORMAL

## 2014-03-10 LAB — TROPONIN I: Troponin I: 0.42 ng/mL (ref <0.30)

## 2014-03-10 MED ORDER — POTASSIUM CHLORIDE 10 MEQ/100ML IV SOLN
10.0000 meq | INTRAVENOUS | Status: AC
  Administered 2014-03-10 – 2014-03-11 (×2): 10 meq via INTRAVENOUS
  Filled 2014-03-10 (×2): qty 100

## 2014-03-10 MED ORDER — DEXTROSE 5 % IV SOLN
1.0000 g | INTRAVENOUS | Status: DC
  Administered 2014-03-10 – 2014-03-14 (×5): 1 g via INTRAVENOUS
  Filled 2014-03-10 (×6): qty 10

## 2014-03-10 NOTE — Progress Notes (Signed)
Hospitalist made aware that pt's HR increased to 130s, non-sustaining. Instructed to notify hospitalist if pt's HR increases to that rate again and is sustained.

## 2014-03-10 NOTE — Progress Notes (Signed)
Pt had a 5 beat run of v tach and later a 10 beat run of v tach. Asymptomatic, pt resting comfortably during both runs. Benedetto Coons notified. 2 runs of K ordered. Will continue to monitor. Edlin Ford, Lavone Orn, RN

## 2014-03-10 NOTE — Progress Notes (Signed)
CRITICAL VALUE ALERT  Critical value received:  Troponin 0.42  Date of notification:  03/10/14  Time of notification:  0526  Critical value read back:Yes.    Nurse who received alert:  Tina Griffiths, RN  MD notified (1st page):  Lenny Pastel, NP  Time of first page:  0530  MD notified (2nd page): n/a  Time of second page: n/a  Responding MD:  Lenny Pastel, NP  Time MD responded:  [phone] No new orders given.

## 2014-03-10 NOTE — Evaluation (Signed)
Occupational Therapy Evaluation Patient Details Name: [name] MRN: [id number] DOB: [birth year] Today's Date: 03/10/2014    History of Present Illness This 80 y.o. male admitted after being found down by caregiver.  Diagnosed with sepsis likely from UTI.  Recently diagnosed with cellulitis of wrist - immobilzation and treated with antibiotics   Clinical Impression   Pt admitted with above. He demonstrates the below listed deficits and will benefit from continued OT to maximize safety and independence with BADLs.  Pt presents to OT with significant cognitive deficits; impaired balance, and generalized weakness.  He maintains Rt hand in flexed position and is guarded.   He requires mod A to move to EOB, mod A to maintain EOB sitting.  He partially stood with mod A, but couldn't achieve full standing.  He requires mod - total A with BADLs.  He will need 24 hour physical assist at discharge.  No family was present during eval to determine LOC at discharge, nor pt's PLOF.   Based on his performance on eval anticipate he will need SNF level rehab.       Follow Up Recommendations  SNF;Supervision/Assistance - 24 hour    Equipment Recommendations  3 in 1 bedside comode    Recommendations for Other Services       Precautions / Restrictions Precautions Precautions: Fall      Mobility Bed Mobility Overal bed mobility: Needs Assistance Bed Mobility: Supine to Sit;Sit to Supine     Supine to sit: Mod assist Sit to supine: Min assist   General bed mobility comments: assist to move LEs off bed and assist to lift trunk   Transfers Overall transfer level: Needs assistance   Transfers: Sit to/from Stand Sit to Stand: Mod assist         General transfer comment: Only able to achieve partial stand.  Pt is very fearful of falling and would not agree to try to stand a second time.     Balance Overall balance assessment: Needs assistance Sitting-balance support: Feet  supported Sitting balance-Leahy Scale: Poor Sitting balance - Comments: requires min to mod A EOB.   Postural control: Right lateral lean;Posterior lean Standing balance support: Bilateral upper extremity supported Standing balance-Leahy Scale: Zero                              ADL Overall ADL's : Needs assistance/impaired Eating/Feeding: NPO   Grooming: Wash/dry face;Minimal assistance;Sitting (EOB)   Upper Body Bathing: Sitting;Maximal assistance   Lower Body Bathing: Maximal assistance;Sit to/from stand   Upper Body Dressing : Maximal assistance;Sitting   Lower Body Dressing: Total assistance;Sit to/from stand   Toilet Transfer: Total assistance (unable)   Toileting- Clothing Manipulation and Hygiene: Total assistance       Functional mobility during ADLs: Moderate assistance (sit to partial stand) General ADL Comments: Pt requires cues and assist for thoroughness and to perform activities asked     Vision                     Perception     Praxis      Pertinent Vitals/Pain Pain Assessment: Faces Faces Pain Scale: Hurts little more Pain Location: Rt UE - pt unable to describe pain Pain Intervention(s): Repositioned     Hand Dominance Right   Extremity/Trunk Assessment Upper Extremity Assessment Upper Extremity Assessment: RUE deficits/detail RUE Deficits / Details: Pt keeps hand fisted, but can extend digits.  Minimal edema  noted.  Pt guards Rt. UE so difficult to accurately assess RUE: Unable to fully assess due to pain RUE Coordination: decreased fine motor;decreased gross motor   Lower Extremity Assessment Lower Extremity Assessment: Defer to PT evaluation   Cervical / Trunk Assessment Cervical / Trunk Assessment: Kyphotic   Communication Communication Communication: Expressive difficulties (pt mumbles and low volume)   Cognition Arousal/Alertness: Awake/alert Behavior During Therapy: WFL for tasks assessed/performed Overall  Cognitive Status: Impaired/Different from baseline Area of Impairment: Attention;Following commands;Problem solving   Current Attention Level: Sustained   Following Commands: Follows one step commands consistently     Problem Solving: Slow processing;Decreased initiation;Requires verbal cues;Requires tactile cues General Comments: Pt will follow commands.  Unable to accurately asses orientation due to pt is difficult to understand   General Comments       Exercises       Shoulder Instructions      Home Living Family/patient expects to be discharged to:: Private residence Living Arrangements: Children (per RN)                               Additional Comments: Pt unable to provide info.   RN reports that pt lived with daughter and confusion/cognitive deficits are new      Prior Functioning/Environment          Comments: Pt unable to provide info and no family present    OT Diagnosis: Generalized weakness;Cognitive deficits;Acute pain   OT Problem List: Decreased strength;Decreased activity tolerance;Impaired balance (sitting and/or standing);Decreased coordination;Decreased cognition;Decreased safety awareness;Decreased knowledge of use of DME or AE   OT Treatment/Interventions: Self-care/ADL training;DME and/or AE instruction;Therapeutic exercise;Therapeutic activities;Cognitive remediation/compensation;Patient/family education;Balance training    OT Goals(Current goals can be found in the care plan section) Acute Rehab OT Goals OT Goal Formulation: Patient unable to participate in goal setting Time For Goal Achievement: 03/24/14 Potential to Achieve Goals: Fair ADL Goals Pt Will Perform Grooming: with supervision;sitting (EOB) Pt Will Perform Upper Body Bathing: with min assist;sitting Pt Will Perform Lower Body Bathing: with mod assist;sit to/from stand Pt Will Transfer to Toilet: with mod assist;stand pivot transfer;bedside commode Pt/caregiver will  Perform Home Exercise Program: Increased ROM;Right Upper extremity;With written HEP provided;With minimal assist  OT Frequency: Min 2X/week   Barriers to D/C:    uncertain of assist available at discharge       Co-evaluation              End of Session Nurse Communication: Mobility status  Activity Tolerance: Patient tolerated treatment well Patient left: in bed;with call bell/phone within reach;with bed alarm set   Time: 1641-1700 OT Time Calculation (min): 19 min Charges:  OT General Charges $OT Visit: 1 Procedure OT Evaluation $Initial OT Evaluation Tier I: 1 Procedure OT Treatments $Therapeutic Activity: 8-22 mins G-Codes:    Aizah Gehlhausen M 2014-03-21, 7:35 PM

## 2014-03-10 NOTE — Evaluation (Signed)
Clinical/Bedside Swallow Evaluation Patient Details  Name: [name] MRN: [id number] Date of Birth: Jun 17, 1926  Today's Date: 03/10/2014 Time: 4098-1191 SLP Time Calculation (min): 21 min  Past Medical History:  Past Medical History  Diagnosis Date  . BPH (benign prostatic hyperplasia)   . GERD (gastroesophageal reflux disease)   . Hyperlipidemia   . Fracture of femoral neck, left 02/17/2013   Past Surgical History:  Past Surgical History  Procedure Laterality Date  . Colon surgery    . Percutaneous pinning Left 02/17/2013    Procedure: PERCUTANEOUS PINNING HIP;  Surgeon: Eulas Post, MD;  Location: [facility];  Service: Orthopedics;  Laterality: Left;   HPI:  80 y.o. male with PMH:  BPH (benign prostatic hyperplasia); GERD, Hyperlipidemia; and Fracture of femoral neck, left (02/17/2013) admitted after fall.  Per MD noted physician called for cellulitis vs CPPD, UTI, sepsis.  CXR no acute cardiopulmonary disease.  Bedside swallow on 02/18/13 revealed primary esophageal dysphagia likely and Dys 3, thin recommended.   Assessment / Plan / Recommendation Clinical Impression  Aspiration is suspected due to immediate cough following water, wet vocal quality in addition to significantly weak laryngeal elevation and multiple swallows.  Recommend MBS (to be completed 9/5) and NPO until objective assessment.      Aspiration Risk  Severe    Diet Recommendation NPO        Other  Recommendations Recommended Consults: MBS Oral Care Recommendations: Oral care BID   Follow Up Recommendations   (TBD)    Frequency and Duration        Pertinent Vitals/Pain WDL        Swallow Study         Oral/Motor/Sensory Function Overall Oral Motor/Sensory Function:  (generalized weakness)   Ice Chips Ice chips: Impaired Presentation: Spoon Oral Phase Impairments: Reduced labial seal;Reduced lingual movement/coordination;Impaired anterior to posterior transit Oral Phase Functional  Implications: Prolonged oral transit Pharyngeal Phase Impairments: Suspected delayed Swallow;Decreased hyoid-laryngeal movement   Thin Liquid Thin Liquid: Impaired Presentation: Cup Pharyngeal  Phase Impairments: Suspected delayed Swallow;Decreased hyoid-laryngeal movement;Cough - Immediate;Multiple swallows    Nectar Thick Nectar Thick Liquid: Not tested   Honey Thick Honey Thick Liquid: Not tested   Puree Puree: Impaired Presentation: Spoon Oral Phase Impairments: Reduced labial seal;Reduced lingual movement/coordination Pharyngeal Phase Impairments: Suspected delayed Swallow;Decreased hyoid-laryngeal movement;Multiple swallows   Solid   GO    Solid: Not tested       Royce Macadamia 03/10/2014,3:07 PM  Breck Coons Lonell Face.Ed [employer] [phone]

## 2014-03-10 NOTE — Progress Notes (Addendum)
Last night, patient's HR kept jumping up to the 140's. Patient stated he had no chest pain. RN went back under tele alarms and noticed patient had went into a.fib with RVR around 1830 and then came out of it.EKG was done and it showed ST with frequent PVC's. On tele, it showed a very irregular rhythm. On call was notified and told that patient was also in pain and RN felt uncomfortable giving PO pain medicine since patient was having issues swallowing during the day. New orders given for a one time dose of Cardizem IV and one time dose of morphine IV. Since these medicines were given, patient's HR has been in the 90's. Will continue to monitor.

## 2014-03-10 NOTE — Progress Notes (Signed)
PROGRESS NOTE  [name] AVW:[id number] DOB: 1926-03-28 DOA: 03/08/2014 PCP: Leo Grosser, MD  HPI/Recap of past 31 hours: 80 year old male with past medical history of BPH, protein calorie malnutrition and recent swelling of right wrist treated with Bactrim for possible cellulitis from home with caretaker was found down on the floor after caretaker had stepped out. He likely had been down for no more than a few hours. In the emergency room, patient had signs consistent with sepsis felt to be secondary to UTI and also possible cellulitis of wrist. He was started on IV fluids and antibiotics and admitted to the hospitalist service. His hemoglobin was noted to be 8.8 on admission with previous hemoglobins being in the 11-12 range. Seen by hand surgery who  felt that he had a right wrist inflammatory arthropathy consistent with CPPD versus gout. They recommended wrist immobilization and no further imaging or aspiration. On admission, initial troponin level normal but 6 hours later increased to 0.4. Followup troponin 6 hours after that also unchanged at 6.4  BNP checked and found to be elevated consistent with acute diastolic heart failure. Patient started on Lasix and has diuresed 4.6 L so far. Seen by speech therapy and found to have severe dysphagia. Changed n.p.o. and modified barium swallow ordered for morning. Physical occupational therapy to see  Assessment/Plan: Active Problems:   BPH (benign prostatic hyperplasia): Stable, patient continued on Flomax  Dysphagia: By speech therapy. Signs of severe dysphagia, likely subacute chronic aspiration. Change diet n.p.o. and a modified barium swallow in the morning. Pending on outcome, this could greatly change plans for prognosis and long-term plans.   Inflammatory arthropathy: Seen by hand surgery. As above, stable his hand. No further imaging or aspiration of fluid needed at this time. Doubt versus CPPD    Anemia: Curiously, repeat  hemoglobin this morning was 12.6, much more consistent with patient's    UTI (lower urinary tract infection): Urine cultures unrevealing. Change antibiotic to IV Rocephin    Hypokalemia: Secondary dehydration. Mild. Followup potassium level this morning resolved    Sepsis: Stabilized. Patient criteria given elevated temperature, tachycardia and urine as source on admission. Pressures are much improved and his heart rate is normalized. White count staying normal.    Protein-calorie malnutrition, severe: Patient is criteria in the context of chronic illness as evidenced by severe muscle wasting and subcutaneous fat loss. Seen by nutrition. Started on Ensure complete by mouth twice a day    Chronic diastolic CHF (congestive heart failure): With elevated BNP, started Lasix. Patient has diuresed approximately 4.6 L already. Given improvement in renal function, we'll continue to diurese. Follow daily weights  Elevated troponin I: Likely from patient's heart failure Although on note, initial level was normal. He may have some mild demand ischemia secondary to underlying sepsis. Treatment of which would be treating the underlying problem. Continue to monitor troponin levels  Code Status: Full code  Family Communication: Spoke with daughter at the bedside  Disposition Plan: Here for several days. Need to further delineate dysphagia degree. Fully diurese.   Consultants:  Speech therapy  Procedures:  Echocardiogram done 9/3: Grade 1 diastolic dysfunction  Antibiotics:  IV vancomycin 9/2-9/4  IV Zosyn 9/2-9/4  IV Rocephin 9/4-present  HPI/Subjective: Patient doing okay. Difficult to understand, still has some right wrist pain  Objective: BP 105/49  Pulse 76  Temp(Src) 97.9 F (36.6 C) (Oral)  Resp 20  Ht  (1.702 m)  Wt 58.2 kg (128 lb 4.9 oz)  BMI  20.09 kg/m2  SpO2 99%  Intake/Output Summary (Last 24 hours) at 03/10/14 1518 Last data filed at 03/10/14 0558  Gross per 24  hour  Intake    350 ml  Output   3150 ml  Net  -2800 ml   Filed Weights   03/08/14 1702 03/08/14 2220 03/10/14 0552  Weight: 63.504 kg (140 lb) 61.508 kg (135 lb 9.6 oz) 58.2 kg (128 lb 4.9 oz)    Exam: Mostly unchanged  General:  Alert and oriented x2, no acute distress  Cardiovascular: Regular rate and rhythm, S1-S2, soft 2/6 systolic ejection murmur  Respiratory: Clear to auscultation bilaterally  Abdomen: Soft, nontender, nondistended, positive bowel sounds  Musculoskeletal: Right wrist is swollen and somewhat erythematous mildly tender.   Data Reviewed: Basic Metabolic Panel:  Recent Labs Lab 03/08/14 1759 03/09/14 0315 03/10/14 0435  NA 140 142 144  K 3.5* 3.9 3.4*  CL 103 105 107  CO2 GLUCOSE 100* 97 113*  BUN CREATININE 0.78 0.73 0.91  CALCIUM 8.9 9.3 9.2  MG  --  2.1  --   PHOS  --  2.6  --    Liver Function Tests:  Recent Labs Lab 03/08/14 1759 03/09/14 0315  AST 42* 163*  ALT 14 43  ALKPHOS 69 69  BILITOT 1.0 1.2  PROT 7.2 7.5  ALBUMIN 3.2* 3.2*   No results found for this basename: LIPASE, AMYLASE,  in the last 168 hours No results found for this basename: AMMONIA,  in the last 168 hours CBC:  Recent Labs Lab 03/08/14 1759 03/09/14 0315  WBC 9.3 8.5  NEUTROABS 7.4  --   HGB 8.8* 12.6*  HCT 25.6* 37.5*  MCV 95.5 95.7  PLT 203 156   Cardiac Enzymes:    Recent Labs Lab 03/08/14 1759 03/09/14 0315 03/09/14 0900 03/09/14 1502 03/10/14 0435  TROPONINI <0.30 0.43* 0.41* <0.30 0.42*   BNP (last 3 results)  Recent Labs  03/09/14 1501  PROBNP 3640.0*   CBG: No results found for this basename: GLUCAP,  in the last 168 hours     Studies: No results found.  Scheduled Meds: . cefTRIAXone (ROCEPHIN)  IV  1 g Intravenous Q24H  . colchicine  0.6 mg Oral BID  . darifenacin  7.5 mg Oral Daily  . docusate sodium  100 mg Oral BID  . feeding supplement (ENSURE COMPLETE)  237 mL Oral BID BM  . furosemide   20 mg Intravenous Q12H  . ramipril  1.25 mg Oral Daily  . sodium chloride  3 mL Intravenous Q12H  . tamsulosin  0.4 mg Oral Daily    Continuous Infusions:    Time spent: 25 minutes  Hollice Espy  [facility] Pager [phone]. If 7PM-7AM, please contact night-coverage at [email or web address], password [email or web address] 03/10/2014, 3:18 PM  LOS: 2 days

## 2014-03-11 ENCOUNTER — Inpatient Hospital Stay (HOSPITAL_COMMUNITY): Payer: Medicare Other

## 2014-03-11 LAB — BASIC METABOLIC PANEL
Anion gap: 16 — ABNORMAL HIGH (ref 5–15)
BUN: 27 mg/dL — AB (ref 6–23)
CO2: 24 mEq/L (ref 19–32)
Calcium: 9.1 mg/dL (ref 8.4–10.5)
Chloride: 106 mEq/L (ref 96–112)
Creatinine, Ser: 0.78 mg/dL (ref 0.50–1.35)
GFR, EST NON AFRICAN AMERICAN: 79 mL/min — AB (ref >90)
Glucose, Bld: 80 mg/dL (ref 70–99)
POTASSIUM: 3.3 meq/L — AB (ref 3.7–5.3)
SODIUM: 146 meq/L (ref 137–147)

## 2014-03-11 LAB — TROPONIN I: Troponin I: <0.3 ng/mL (ref <0.30)

## 2014-03-11 LAB — CBC
HCT: 34.9 % — ABNORMAL LOW (ref 39.0–52.0)
Hemoglobin: 11.4 g/dL — ABNORMAL LOW (ref 13.0–17.0)
MCH: 32 pg (ref 26.0–34.0)
MCHC: 32.7 g/dL (ref 30.0–36.0)
MCV: 98 fL (ref 78.0–100.0)
Platelets: 136 10*3/uL — ABNORMAL LOW (ref 150–400)
RBC: 3.56 MIL/uL — AB (ref 4.22–5.81)
RDW: 13.5 % (ref 11.5–15.5)
WBC: 6.4 10*3/uL (ref 4.0–10.5)

## 2014-03-11 LAB — URIC ACID: URIC ACID, SERUM: 5.4 mg/dL (ref 4.0–7.8)

## 2014-03-11 MED ORDER — VANCOMYCIN HCL 500 MG IV SOLR
500.0000 mg | Freq: Two times a day (BID) | INTRAVENOUS | Status: DC
  Administered 2014-03-12 – 2014-03-13 (×3): 500 mg via INTRAVENOUS
  Filled 2014-03-11 (×4): qty 500

## 2014-03-11 MED ORDER — VANCOMYCIN HCL IN DEXTROSE 750-5 MG/150ML-% IV SOLN
750.0000 mg | Freq: Once | INTRAVENOUS | Status: AC
  Administered 2014-03-11: 750 mg via INTRAVENOUS
  Filled 2014-03-11: qty 150

## 2014-03-11 MED ORDER — FLUCONAZOLE 100MG IVPB
100.0000 mg | INTRAVENOUS | Status: DC
  Administered 2014-03-11 – 2014-03-14 (×4): 100 mg via INTRAVENOUS
  Filled 2014-03-11 (×5): qty 50

## 2014-03-11 NOTE — Evaluation (Signed)
Physical Therapy Evaluation Patient Details Name: [name] MRN: [id number] DOB: 1926/04/19 Today's Date: 03/11/2014   History of Present Illness  This 80 y.o. male admitted after being found down by caregiver.  Diagnosed with sepsis likely from UTI.  Recently diagnosed with cellulitis of wrist - immobilzation and treated with antibiotics  Clinical Impression  Pt pleasant and cooperative and following simple cues.  Pt currently requiring +2 assist for mobilization  for safety 2* precarious balance with ambulation.  Recommending home with HHPT follow up vs SNF dependent on acute stay progress and level of assist available at home.    Follow Up Recommendations Home health PT;SNF    Equipment Recommendations  None recommended by PT    Recommendations for Other Services       Precautions / Restrictions Precautions Precautions: Fall Restrictions Weight Bearing Restrictions: No      Mobility  Bed Mobility Overal bed mobility: Needs Assistance Bed Mobility: Supine to Sit     Supine to sit: Min assist;+2 for physical assistance     General bed mobility comments: assist to move LEs off bed and assist to lift trunk   Transfers Overall transfer level: Needs assistance Equipment used: Rolling walker (2 wheeled) Transfers: Sit to/from Stand Sit to Stand: Min assist;+2 safety/equipment         General transfer comment: cues for transition position and use of UEs to self assist.  Ambulation/Gait Ambulation/Gait assistance: +2 safety/equipment;Min assist Ambulation Distance (Feet): 48 Feet Assistive device: Rolling walker (2 wheeled) Gait Pattern/deviations: Step-to pattern;Step-through pattern;Decreased step length - right;Decreased step length - left;Shuffle;Staggering left;Staggering right;Trunk flexed Gait velocity: decr   General Gait Details: cues for posture, position from RW and saftey awareness.    Stairs            Wheelchair Mobility    Modified  Rankin (Stroke Patients Only)       Balance Overall balance assessment: Needs assistance Sitting-balance support: Feet supported;No upper extremity supported Sitting balance-Leahy Scale: Fair     Standing balance support: Bilateral upper extremity supported Standing balance-Leahy Scale: Fair Standing balance comment: Pt able to stand with RW and min guard for saftey.  Balance deteriorates with initiation of steps                             Pertinent Vitals/Pain Pain Assessment: No/denies pain    Home Living Family/patient expects to be discharged to:: Private residence Living Arrangements: Children               Additional Comments: Pt unable to provide info.   RN reports that pt lived with daughter and confusion/cognitive deficits are new    Prior Function Level of Independence: Needs assistance         Comments: Used RW for ambulation - unclear on level of assist with ADL     Hand Dominance        Extremity/Trunk Assessment   Upper Extremity Assessment: Defer to OT evaluation           Lower Extremity Assessment: Generalized weakness      Cervical / Trunk Assessment: Kyphotic  Communication   Communication: Expressive difficulties (Speech largely unintelligible )  Cognition Arousal/Alertness: Awake/alert Behavior During Therapy: WFL for tasks assessed/performed Overall Cognitive Status: Impaired/Different from baseline Area of Impairment: Safety/judgement;Following commands       Following Commands: Follows one step commands consistently       General Comments: Requires repetition of  cues for follow through    General Comments      Exercises        Assessment/Plan    PT Assessment Patient needs continued PT services  PT Diagnosis Difficulty walking;Generalized weakness;Altered mental status   PT Problem List Decreased strength;Decreased range of motion;Decreased activity tolerance;Decreased mobility;Decreased  knowledge of use of DME  PT Treatment Interventions DME instruction;Gait training;Functional mobility training;Therapeutic activities;Therapeutic exercise;Patient/family education;Balance training   PT Goals (Current goals can be found in the Care Plan section) Acute Rehab PT Goals Patient Stated Goal: I'm hungry PT Goal Formulation: Patient unable to participate in goal setting Time For Goal Achievement: 03/25/14 Potential to Achieve Goals: Fair    Frequency Min 3X/week   Barriers to discharge        Co-evaluation               End of Session Equipment Utilized During Treatment: Gait belt Activity Tolerance: Patient tolerated treatment well;Patient limited by fatigue Patient left: in chair;with call bell/phone within reach;with chair alarm set Nurse Communication: Mobility status         Time: 6045-4098 PT Time Calculation (min): 19 min   Charges:   PT Evaluation $Initial PT Evaluation Tier I: 1 Procedure PT Treatments $Gait Training: 8-22 mins   PT G Codes:          [name] 03/11/2014, 8:56 AM

## 2014-03-11 NOTE — Progress Notes (Signed)
PROGRESS NOTE  [name] MVH:[id number] DOB: [birth year] DOA: 03/08/2014 PCP: Leo Grosser, MD  HPI/Recap of past 39 hours: 80 year old male with past medical history of BPH, protein calorie malnutrition and recent swelling of right wrist treated with Bactrim for possible cellulitis from home with caretaker was found down on the floor after caretaker had stepped out. He likely had been down for no more than a few hours. In the emergency room, patient had signs consistent with sepsis felt to be secondary to UTI and also possible cellulitis of wrist. He was started on IV fluids and antibiotics and admitted to the hospitalist service. His hemoglobin was noted to be 8.8 on admission with previous hemoglobins being in the 11-12 range. Seen by hand surgery who  felt that he had a right wrist inflammatory arthropathy consistent with CPPD versus gout. They recommended wrist immobilization and no further imaging or aspiration. On admission, initial troponin level normal but 6 hours later increased to 0.4. Followup troponin 6 hours after that also unchanged at 6.4  Patient found to have elevated BNP and with elevation, started on Lasix with good diuresis. Seen by speech therapy and found to have dysphagia so patient n.p.o. check a swallow eval.  Assessment/Plan: Active Problems:   BPH (benign prostatic hyperplasia): Stable, patient continued on Flomax  Dysphagia: By speech therapy. Signs of severe dysphagia, likely subacute chronic aspiration. Change diet n.p.o. and a modified barium swallow in the morning. Pending on outcome, this could greatly change plans for prognosis and long-term plans.   Inflammatory arthropathy: Seen by hand surgery. As above, stable his hand. No further imaging or aspiration of fluid needed at this time. gout versus CPPD. Farsi brought attention that patient was recalled seeing toxicity, so holding colchicine for now checking uric acid level    Anemia: Curiously, his  followup hemoglobin the next day was normal. It has stayed that way since    UTI (lower urinary tract infection): Urine cultures unrevealing. Change antibiotic to IV Rocephin. Noted yeast in repeat UA, so have added Diflucan    Hypokalemia: Secondary dehydration. Mild. Followup potassium level this morning resolved    Sepsis: Stabilized. Patient criteria given elevated temperature, tachycardia and urine as source on admission. Pressures are much improved and his heart rate is normalized. White count staying normal.    Protein-calorie malnutrition, severe: Patient is criteria in the context of chronic illness as evidenced by severe muscle wasting and subcutaneous fat loss. Seen by nutrition. Started on Ensure complete by mouth twice a day    Chronic diastolic CHF (congestive heart failure): With elevated BNP, started Lasix. Patient has diuresed approximately over 6 L already. Given improvement in renal function, we'll continue to diurese. Follow daily weights  Elevated troponin I: Likely from patient's heart failure Although on note, initial level was normal. He may have some mild demand ischemia secondary to underlying sepsis. Treatment of which would be treating the underlying problem. Continue to monitor troponin levels  Code Status: Full code  Family Communication: Spoke with daughter at the bedside  Disposition Plan: Here for several days. Continue diuresis and evaluate degree of dysphagia   Consultants:  Speech therapy  Procedures:  Echocardiogram done 9/3: Grade 1 diastolic dysfunction  Antibiotics:  IV vancomycin 9/2-9/4  IV Zosyn 9/2-9/4  IV Rocephin 9/4-present  HPI/Subjective: Patient doing about the same. Does not really have any complaints of some sore wrist  Objective: BP 132/81  Pulse 83  Temp(Src) 97.8 F (36.6 C) (Oral)  Resp 20  Ht  (1.702 m)  Wt 55.8 kg (123 lb 0.3 oz)  BMI 19.26 kg/m2  SpO2 96%  Intake/Output Summary (Last 24 hours) at  03/11/14 1401 Last data filed at 03/11/14 1322  Gross per 24 hour  Intake      0 ml  Output   2125 ml  Net  -2125 ml   Filed Weights   03/08/14 2220 03/10/14 0552 03/11/14 0500  Weight: 61.508 kg (135 lb 9.6 oz) 58.2 kg (128 lb 4.9 oz) 55.8 kg (123 lb 0.3 oz)    Exam: Mostly unchanged  General:  Alert and oriented x2, no acute distress  Cardiovascular: Regular rate and rhythm, S1-S2, soft 2/6 systolic ejection murmur  Respiratory: Clear to auscultation bilaterally  Abdomen: Soft, nontender, nondistended, positive bowel sounds  Musculoskeletal: Right wrist less swollen and mildly erythematous  Data Reviewed: Basic Metabolic Panel:  Recent Labs Lab 03/08/14 1759 03/09/14 0315 03/10/14 0435 03/11/14 0450  NA 140 142 144 146  K 3.5* 3.9 3.4* 3.3*  CL 103 105 107 106  CO2 GLUCOSE 100* 97 113* 80  BUN 27*  CREATININE 0.78 0.73 0.91 0.78  CALCIUM 8.9 9.3 9.2 9.1  MG  --  2.1  --   --   PHOS  --  2.6  --   --    Liver Function Tests:  Recent Labs Lab 03/08/14 1759 03/09/14 0315  AST 42* 163*  ALT 14 43  ALKPHOS 69 69  BILITOT 1.0 1.2  PROT 7.2 7.5  ALBUMIN 3.2* 3.2*   No results found for this basename: LIPASE, AMYLASE,  in the last 168 hours No results found for this basename: AMMONIA,  in the last 168 hours CBC:  Recent Labs Lab 03/08/14 1759 03/09/14 0315 03/11/14 0450  WBC 9.3 8.5 6.4  NEUTROABS 7.4  --   --   HGB 8.8* 12.6* 11.4*  HCT 25.6* 37.5* 34.9*  MCV 95.5 95.7 98.0  PLT 203 156 136*   Cardiac Enzymes:    Recent Labs Lab 03/09/14 0315 03/09/14 0900 03/09/14 1502 03/10/14 0435 03/11/14 0450  TROPONINI 0.43* 0.41* <0.30 0.42* <0.30   BNP (last 3 results)  Recent Labs  03/09/14 1501  PROBNP 3640.0*   CBG: No results found for this basename: GLUCAP,  in the last 168 hours     Studies: No results found.  Scheduled Meds: . cefTRIAXone (ROCEPHIN)  IV  1 g Intravenous Q24H  . colchicine  0.6 mg Oral  BID  . darifenacin  7.5 mg Oral Daily  . docusate sodium  100 mg Oral BID  . feeding supplement (ENSURE COMPLETE)  237 mL Oral BID BM  . fluconazole (DIFLUCAN) IV  100 mg Intravenous Q24H  . furosemide  20 mg Intravenous Q12H  . ramipril  1.25 mg Oral Daily  . sodium chloride  3 mL Intravenous Q12H  . tamsulosin  0.4 mg Oral Daily    Continuous Infusions:    Time spent: 15 minutes  Hollice Espy  [facility] Pager [phone]. If 7PM-7AM, please contact night-coverage at [email or web address], password [facility] 03/11/2014, 2:01 PM  LOS: 3 days

## 2014-03-11 NOTE — Progress Notes (Signed)
ANTIBIOTIC CONSULT NOTE - INITIAL  Pharmacy Consult for Vancomycin Indication: Bacteremia  No Known Allergies  Patient Measurements: Height:  (170.2 cm) Weight: 123 lb 0.3 oz (55.8 kg) IBW/kg (Calculated) : 66.1  Vital Signs: Temp: 97.8 F (36.6 C) (09/05 1319) Temp src: Oral (09/05 1319) BP: 132/81 mmHg (09/05 1319) Pulse Rate: 83 (09/05 1319) Intake/Output from previous day: 09/04 0701 - 09/05 0700 In: -  Out: 1600 [Urine:1600] Intake/Output from this shift: Total I/O In: 0  Out: 525 [Urine:525]  Labs:  Recent Labs  03/08/14 1759 03/09/14 0315 03/10/14 0435 03/11/14 0450  WBC 9.3 8.5  --  6.4  HGB 8.8* 12.6*  --  11.4*  PLT 203 156  --  136*  CREATININE 0.78 0.73 0.91 0.78   Estimated Creatinine Clearance: 52.3 ml/min (by C-G formula based on Cr of 0.78). No results found for this basename: VANCOTROUGH, Leodis Binet, VANCORANDOM, GENTTROUGH, GENTPEAK, GENTRANDOM, TOBRATROUGH, TOBRAPEAK, TOBRARND, AMIKACINPEAK, AMIKACINTROU, AMIKACIN,  in the last 72 hours   Microbiology: Recent Results (from the past 720 hour(s))  CULTURE, BLOOD (ROUTINE X 2)     Status: None   Collection Time    03/08/14  6:24 PM      Result Value Ref Range Status   Specimen Description BLOOD BLOOD LEFT FOREARM   Final   Special Requests BOTTLES DRAWN AEROBIC AND ANAEROBIC 5CC   Final   Culture  Setup Time     Final   Value: 03/08/2014 23:22     Performed at [facility]   Culture     Final   Value: GRAM POSITIVE RODS     Note: Gram Stain Report Called to,Read Back By and Verified With: KRISTEN PHILLIPS 03/11/14 @ 5:16PM BY RUSCOE A.     Performed at [facility]   Report Status PENDING   Incomplete  CULTURE, BLOOD (ROUTINE X 2)     Status: None   Collection Time    03/08/14  6:29 PM      Result Value Ref Range Status   Specimen Description BLOOD BLOOD RIGHT FOREARM   Final   Special Requests BOTTLES DRAWN AEROBIC AND ANAEROBIC 5CC   Final   Culture  Setup Time      Final   Value: 03/08/2014 23:22     Performed at [facility]   Culture     Final   Value:        BLOOD CULTURE RECEIVED NO GROWTH TO DATE CULTURE WILL BE HELD FOR 5 DAYS BEFORE ISSUING A FINAL NEGATIVE REPORT     Performed at [facility]   Report Status PENDING   Incomplete  URINE CULTURE     Status: None   Collection Time    03/08/14  6:34 PM      Result Value Ref Range Status   Specimen Description URINE, CATHETERIZED   Final   Special Requests Normal   Final   Culture  Setup Time     Final   Value: 03/09/2014 00:10     Performed at [facility] Count     Final   Value: NO GROWTH     Performed at [facility]   Culture     Final   Value: NO GROWTH     Performed at [facility]   Report Status 03/10/2014 FINAL   Final    Medical History: Past Medical History  Diagnosis Date  . BPH (benign prostatic hyperplasia)   .  GERD (gastroesophageal reflux disease)   . Hyperlipidemia   . Fracture of femoral neck, left 02/17/2013    Medications:  Scheduled:  . cefTRIAXone (ROCEPHIN)  IV  1 g Intravenous Q24H  . darifenacin  7.5 mg Oral Daily  . docusate sodium  100 mg Oral BID  . feeding supplement (ENSURE COMPLETE)  237 mL Oral BID BM  . fluconazole (DIFLUCAN) IV  100 mg Intravenous Q24H  . furosemide  20 mg Intravenous Q12H  . ramipril  1.25 mg Oral Daily  . sodium chloride  3 mL Intravenous Q12H  . tamsulosin  0.4 mg Oral Daily   Infusions:   PRN: acetaminophen, acetaminophen, hydrALAZINE, LORazepam, ondansetron (ZOFRAN) IV, ondansetron, polyethylene glycol, traMADol  Assessment: 70 yoM presents to [facility] with CC of fever and fall on 9/2. Note recent antibiotic course of bactrim for hand cellulitis. Initially started on vancomycin and zosyn for presumed sepsis, then narrowed to rocephin on 9/4. Today 9/5, MD adding back vancomycin for 1 of 2 blood cultures growing gram positive rods.  9/2 >> Vancomycin >> 9/4 9/2 >> Zosyn >>  9/4 9/4 >> Ceftriaxone >> 9/5 >> Fluconazole >> 9/5 >> Vancomycin resumed >>  Tmax: Afebrile WBCs: WNL Renal: SCr 0.78, CrCl 52 ml/min CG, 68 ml/min Normalized   9/2 blood: 1/2 GPR 9/2 urine: NGF 9/5 blood: sent  Goal of Therapy:  Vancomycin trough level 15-20 mcg/ml  Plan:   Vancomycin  IV x 1, then  IV q12h Check trough at steady state Follow up renal function & cultures, narrow cultures as appropriate  Loralee Pacas, PharmD, BCPS Pager: [phone] 03/11/2014,5:32 PM

## 2014-03-11 NOTE — Progress Notes (Signed)
03/11/2014 1445 NCM spoke to pt's son, [name]. States to speak to sister, [name]. Offered choice for [facility]. Left list HH agency in the room with son. States he will speak to his sister. Isidoro Donning RN CCM Case Mgmt phone [phone]

## 2014-03-11 NOTE — Clinical Social Work Psychosocial (Signed)
Clinical Social Work Department BRIEF PSYCHOSOCIAL ASSESSMENT 03/11/2014  Patient:  [name], [name]     Account Number:  [id number]     Admit date:  03/08/2014  Clinical Social Worker:  Lavell Luster  Date/Time:  03/11/2014 02:01 PM  Referred by:  Physician  Date Referred:  03/11/2014 Referred for  SNF Placement   Other Referral:   Interview type:  Family Other interview type:   Patient unable to contribute to assessment at this time. CSW spoke with patient's daughter Burna Mortimer to complete assessment.    PSYCHOSOCIAL DATA Living Status:  WIFE Admitted from facility:   Level of care:   Primary support name:  Burna Mortimer Primary support relationship to patient:  CHILD, ADULT Degree of support available:   Support is good.    CURRENT CONCERNS Current Concerns  Post-Acute Placement   Other Concerns:   NA    SOCIAL WORK ASSESSMENT / PLAN CSW spoke with patient's daughter by phone as no family present at bedside. Prior to admission patient was living with his wife in his own home. Per daughter patient is usually not as confused as he is right now. She states that the patient's wife has dementia and is followed by [facility]. Daughter states that the patient is "very stubborn" and has not had success in getting the patient involved with [facility]. CSW explained to daughter that PT has recommended either SNF or HHPT for patient at discharge. Daughter states that patient will be returning home with HHPT services at discharge. She states that her father has been in a facility in the past and he will not go back to a facility. Burna Mortimer states that she will pay for extra sitter services for the patient when he returns home. Daughter seems comfortable with patient returning home.   Assessment/plan status:  Psychosocial Support/Ongoing Assessment of Needs Other assessment/ plan:   Update RNCM.   Information/referral to community resources:   NONE    PATIENT'S/FAMILY'S RESPONSE TO  PLAN OF CARE: Patient's daughter plans for patient to discharge to home with HHPT once medically stable. CSW requested that daughter contact treatment team if plans change. CSW will sign off at this time.       Roddie Mc MSW, [place], [place], [phone]

## 2014-03-11 NOTE — Progress Notes (Signed)
Speech Language Pathology  Patient Details Name: [name] MRN: [id number] DOB: 1926-05-31 Today's Date: 03/11/2014 Time:  -     MBS scheduled for today around 1:45-2:00  Royce Macadamia M.Ed [employer] [phone]

## 2014-03-11 NOTE — Procedures (Signed)
Objective Swallowing Evaluation: Bedside swallow evaluation  Patient Details  Name: [name] MRN: [id number] Date of Birth: Nov 27, 1925  Today's Date: 03/11/2014 Time: 1400-1420 SLP Time Calculation (min): 20 min  Past Medical History:  Past Medical History  Diagnosis Date  . BPH (benign prostatic hyperplasia)   . GERD (gastroesophageal reflux disease)   . Hyperlipidemia   . Fracture of femoral neck, left 02/17/2013   Past Surgical History:  Past Surgical History  Procedure Laterality Date  . Colon surgery    . Percutaneous pinning Left 02/17/2013    Procedure: PERCUTANEOUS PINNING HIP;  Surgeon: Eulas Post, MD;  Location: [facility] OR;  Service: Orthopedics;  Laterality: Left;   HPI:  80 y.o. male with PMH:  BPH (benign prostatic hyperplasia); GERD, Hyperlipidemia; and Fracture of femoral neck, left (02/17/2013) admitted after fall.  Per MD noted physician called for cellulitis vs CPPD, UTI, sepsis.  CXR no acute cardiopulmonary disease.  Bedside swallow on 02/18/13 revealed primary esophageal dysphagia likely and Dys 3, thin recommended.     Assessment / Plan / Recommendation Clinical Impression  Dysphagia Diagnosis: Moderate oral phase dysphagia;Severe oral phase dysphagia;Severe pharyngeal phase dysphagia Clinical impression: Moderate-severe oral dysphagia as evidenced by lingual pumping, weak and delayed propulsion with spontaneous extension of neck to facilitate oral transit.  Pharyngeal phase characterized by motor impairments with reduced tongue base retraction, decreased pharyngeal contraction, decreased hyolaryngeal elevation leading to silent aspiration with consistencies thinner than puree.  Maximum vallecular residue increases aspiration risk with puree and penetration x 1 (flash) with puree barium. Pt. is at significant aspiration risk with puree and SLP discussed with MD who will speak with family re: wishes and plan.  SLP will follow up for decision.       Treatment  Recommendation  Therapy as outlined in treatment plan below    Diet Recommendation NPO        Other  Recommendations Oral Care Recommendations: Oral care BID   Follow Up Recommendations  None    Frequency and Duration min 1 x/week  2 weeks   Pertinent Vitals/Pain WDL           Reason for Referral Objectively evaluate swallowing function   Oral Phase Oral Preparation/Oral Phase Oral Phase: Impaired Oral - Honey Oral - Honey Teaspoon: Lingual pumping;Piecemeal swallowing;Lingual/palatal residue;Reduced posterior propulsion;Weak lingual manipulation;Delayed oral transit Oral - Honey Cup: Lingual pumping;Piecemeal swallowing;Lingual/palatal residue;Reduced posterior propulsion;Weak lingual manipulation;Delayed oral transit Oral - Thin Oral - Thin Cup: Weak lingual manipulation;Piecemeal swallowing Oral - Solids Oral - Puree: Lingual pumping;Piecemeal swallowing;Lingual/palatal residue;Reduced posterior propulsion;Weak lingual manipulation;Delayed oral transit   Pharyngeal Phase Pharyngeal Phase Pharyngeal Phase: Impaired Pharyngeal - Honey Pharyngeal - Honey Teaspoon: Pharyngeal residue - valleculae;Reduced tongue base retraction Pharyngeal - Honey Cup: Pharyngeal residue - valleculae;Penetration/Aspiration during swallow;Reduced laryngeal elevation;Reduced airway/laryngeal closure;Reduced tongue base retraction;Reduced anterior laryngeal mobility;Reduced pharyngeal peristalsis Penetration/Aspiration details (honey cup): Material enters airway, passes BELOW cords without attempt by patient to eject out (silent aspiration) Pharyngeal - Thin Pharyngeal - Thin Cup: Penetration/Aspiration during swallow;Pharyngeal residue - pyriform sinuses;Pharyngeal residue - valleculae;Reduced tongue base retraction;Reduced airway/laryngeal closure;Reduced laryngeal elevation Penetration/Aspiration details (thin cup): Material enters airway, passes BELOW cords without attempt by patient to eject  out (silent aspiration) Pharyngeal - Solids Pharyngeal - Puree: Pharyngeal residue - valleculae;Reduced tongue base retraction;Penetration/Aspiration during swallow;Reduced airway/laryngeal closure;Reduced laryngeal elevation;Reduced pharyngeal peristalsis (max) Penetration/Aspiration details (puree): Material enters airway, remains ABOVE vocal cords then ejected out  Cervical Esophageal Phase    GO    Cervical Esophageal  Phase Cervical Esophageal Phase: Impaired (probable osteophytes preventing efficient transit to esophag)         Royce Macadamia 03/11/2014, 4:40 PM Breck Coons Lonell Face.Ed [employer] [phone]

## 2014-03-12 DIAGNOSIS — R7881 Bacteremia: Secondary | ICD-10-CM | POA: Diagnosis present

## 2014-03-12 LAB — BASIC METABOLIC PANEL
Anion gap: 15 (ref 5–15)
BUN: 31 mg/dL — ABNORMAL HIGH (ref 6–23)
CHLORIDE: 110 meq/L (ref 96–112)
CO2: 28 mEq/L (ref 19–32)
CREATININE: 0.81 mg/dL (ref 0.50–1.35)
Calcium: 9.6 mg/dL (ref 8.4–10.5)
GFR calc non Af Amer: 78 mL/min — ABNORMAL LOW (ref >90)
Glucose, Bld: 87 mg/dL (ref 70–99)
Potassium: 3.4 mEq/L — ABNORMAL LOW (ref 3.7–5.3)
SODIUM: 153 meq/L — AB (ref 137–147)

## 2014-03-12 LAB — PRO B NATRIURETIC PEPTIDE: Pro B Natriuretic peptide (BNP): 409 pg/mL (ref 0–450)

## 2014-03-12 MED ORDER — CHLORHEXIDINE GLUCONATE 0.12 % MT SOLN
15.0000 mL | Freq: Two times a day (BID) | OROMUCOSAL | Status: DC
  Administered 2014-03-12 – 2014-03-14 (×5): 15 mL via OROMUCOSAL
  Filled 2014-03-12 (×7): qty 15

## 2014-03-12 MED ORDER — STARCH (THICKENING) PO POWD
ORAL | Status: DC | PRN
  Filled 2014-03-12: qty 227

## 2014-03-12 MED ORDER — CETYLPYRIDINIUM CHLORIDE 0.05 % MT LIQD
7.0000 mL | Freq: Two times a day (BID) | OROMUCOSAL | Status: DC
  Administered 2014-03-12 – 2014-03-14 (×6): 7 mL via OROMUCOSAL

## 2014-03-12 NOTE — Progress Notes (Signed)
CARE MANAGEMENT NOTE 03/12/2014  Patient:  [name], [name]   Account Number:  [id number]  Date Initiated:  03/10/2014  Documentation initiated by:  McGIBBONEY,COOKIE  Subjective/Objective Assessment:   pt admitted with confused, UTI     Action/Plan:   from home   Anticipated DC Date:  03/13/2014   Anticipated DC Plan:  HOME W HOME HEALTH SERVICES  In-house referral  Clinical Social Worker      DC Planning Services  CM consult      Choice offered to / List presented to:             Status of service:  In process, will continue to follow Medicare Important Message given?  YES (If response is "NO", the following Medicare IM given date fields will be blank) Date Medicare IM given:  03/10/2014 Medicare IM given by:  Ezekiel Ina Date Additional Medicare IM given:  03/12/2014 Additional Medicare IM given by:  Isidoro Donning  Discharge Disposition:    Per UR Regulation:  Reviewed for med. necessity/level of care/duration of stay  If discussed at Long Length of Stay Meetings, dates discussed:    Comments:  03/12/2014 1600 NCM spoke to dtr, [name]. [facility]. States pt will need hospital bed at home. Isidoro Donning RN CCM Case Mgmt phone [phone]  03/11/2014 1445 NCM spoke to pt's son, [name]. States to speak to sister, [name]. Offered choice for [facility]. Left list HH agency in the room with son. States he will speak to his sister. Isidoro Donning RN CCM Case Mgmt phone [phone]  03/10/14 MMcGibboney, RN, BSN Spoke with pt's daughter at bedside concerning opposition for discharge, Home Health or SNF.

## 2014-03-12 NOTE — Progress Notes (Addendum)
PROGRESS NOTE  [name] RUE:[id number] DOB: [birth year] DOA: 03/08/2014 PCP: Leo Grosser, MD  HPI/Recap of past 73 hours: 80 year old male with past medical history of BPH, protein calorie malnutrition and recent swelling of right wrist treated with Bactrim for possible cellulitis from home with caretaker was found down on the floor after caretaker had stepped out. He likely had been down for no more than a few hours. In the emergency room, patient had signs consistent with sepsis felt to be secondary to UTI and also possible cellulitis of wrist. He was started on IV fluids and antibiotics and admitted to the hospitalist service. His hemoglobin was noted to be 8.8 on admission with previous hemoglobins being in the 11-12 range. Seen by hand surgery who  felt that he had a right wrist inflammatory arthropathy consistent with CPPD versus gout. They recommended wrist immobilization and no further imaging or aspiration. On admission, initial troponin level normal but 6 hours later increased to 0.4 and has since normalized  Patient found to have elevated BNP and with elevation, started on Lasix with diuresis of 8 L so far. Seen by speech therapy and found to have dysphagia, confirmed with modified barium swallow is absolute. On 9/5 after noon, initial blood cultures done on admission note one bottle positive for gram-positive rods and patient started on vancomycin.  After speaking with patient's daughter who is a Engineer, civil (consulting), she understands the risks and concerns for aspiration. She is in the healthcare field. Agree that the best plan for this patient would be a restricted diet understanding aspiration risks. She agreed that continued to try to aggressively treat him every time he developed aspiration pneumonia would be futile and also prolonging suffering. She is going to touch base with other family members to confirm change in CODE STATUS, so for now we'll keep as full code but start dysphagia 3  nectar thick diet.  Assessment/Plan: Active Problems:   BPH (benign prostatic hyperplasia): Stable, patient continued on Flomax  Dysphagia: By speech therapy. Signs of severe dysphagia, likely subacute chronic aspiration. Modified barium swallow done 9/5 confirms absolute aspiration risk. Patient kept a full n.p.o. and plan is discussed with family.   Inflammatory arthropathy: Seen by hand surgery. As above, stable his hand. No further imaging or aspiration of fluid needed at this time. gout versus CPPD. Farsi brought attention that patient was recalled seeing toxicity, so holding colchicine for now checking uric acid level  Gram-positive bacteremia: Noted one bottle of blood culture from admission positive on 9/5 for gram-positive rods. Awaiting further specification and sensitivities Vancomycin added    Anemia: Curiously, his followup hemoglobin the next day was normal. It has stayed that way since    UTI (lower urinary tract infection): Urine cultures unrevealing. Change antibiotic to IV Rocephin. Noted yeast in repeat UA, so have added Diflucan    Hypokalemia: Secondary dehydration. Mild. Followup potassium level this morning resolved    Sepsis: Stabilized. Patient criteria given elevated temperature, tachycardia and urine as source on admission. Pressures are much improved and his heart rate is normalized. White count staying normal. Blood cultures above-noted    Protein-calorie malnutrition, severe: Patient is criteria in the context of chronic illness as evidenced by severe muscle wasting and subcutaneous fat loss. Seen by nutrition. Started on Ensure complete by mouth twice a day    Chronic diastolic CHF (congestive heart failure): With elevated BNP, started Lasix. Patient has diuresed approximately over 8 L already. Given improvement in renal function, we'll continue to  diurese. He is down 15 pounds since admission. BNP today normalize, however we'll continue diuresis until he has  reached his dry weight  Elevated troponin I: Likely from patient's heart failure Although on note, initial level was normal. He may have some mild demand ischemia secondary to underlying sepsis. Treatment of which would be treating the underlying problem. Continue to monitor troponin levels  Code Status: Full code  Family Communication: Spoke with son at the bedside yesterday  Disposition Plan: Here for several days. Continue diuresis, waiting for blood culture delineation and determine plan in regards to dysphagia  Consultants:  Speech therapy  Procedures:  Echocardiogram done 9/3: Grade 1 diastolic dysfunction  Antibiotics:  IV vancomycin 9/2-9/4  IV Zosyn 9/2-9/4  IV Rocephin 9/4-present  HPI/Subjective: Patient doing about the same. Complains of being very thirsty  Objective: BP 114/68  Pulse 92  Temp(Src) 97.8 F (36.6 C) (Oral)  Resp 15  Ht  (1.702 m)  Wt 54.6 kg (120 lb 5.9 oz)  BMI 18.85 kg/m2  SpO2 98%  Intake/Output Summary (Last 24 hours) at 03/12/14 1009 Last data filed at 03/12/14 1610  Gross per 24 hour  Intake      0 ml  Output   1925 ml  Net  -1925 ml   Filed Weights   03/10/14 0552 03/11/14 0500 03/12/14 0622  Weight: 58.2 kg (128 lb 4.9 oz) 55.8 kg (123 lb 0.3 oz) 54.6 kg (120 lb 5.9 oz)    Exam: Mostly unchanged  General:  Alert and oriented x2, no acute distress, mucous membranes quite dry  Cardiovascular: Regular rate and rhythm, S1-S2, soft 2/6 systolic ejection murmur  Respiratory: Clear to auscultation bilaterally  Abdomen: Soft, nontender, nondistended, positive bowel sounds  Musculoskeletal: Right wrist much less tender  Data Reviewed: Basic Metabolic Panel:  Recent Labs Lab 03/08/14 1759 03/09/14 0315 03/10/14 0435 03/11/14 0450 03/12/14 0602  NA 140 142 144 146 153*  K 3.5* 3.9 3.4* 3.3* 3.4*  CL 103 105 107 106 110  CO2 GLUCOSE 100* 97 113* 80 87  BUN 27* 31*  CREATININE 0.78  0.73 0.91 0.78 0.81  CALCIUM 8.9 9.3 9.2 9.1 9.6  MG  --  2.1  --   --   --   PHOS  --  2.6  --   --   --    Liver Function Tests:  Recent Labs Lab 03/08/14 1759 03/09/14 0315  AST 42* 163*  ALT 14 43  ALKPHOS 69 69  BILITOT 1.0 1.2  PROT 7.2 7.5  ALBUMIN 3.2* 3.2*   No results found for this basename: LIPASE, AMYLASE,  in the last 168 hours No results found for this basename: AMMONIA,  in the last 168 hours CBC:  Recent Labs Lab 03/08/14 1759 03/09/14 0315 03/11/14 0450  WBC 9.3 8.5 6.4  NEUTROABS 7.4  --   --   HGB 8.8* 12.6* 11.4*  HCT 25.6* 37.5* 34.9*  MCV 95.5 95.7 98.0  PLT 203 156 136*   Cardiac Enzymes:    Recent Labs Lab 03/09/14 0315 03/09/14 0900 03/09/14 1502 03/10/14 0435 03/11/14 0450  TROPONINI 0.43* 0.41* <0.30 0.42* <0.30   BNP (last 3 results)  Recent Labs  03/09/14 1501 03/12/14 0602  PROBNP 3640.0* 409.0   CBG: No results found for this basename: GLUCAP,  in the last 168 hours     Studies: Dg Swallowing Func-speech Pathology  03/11/2014    Clinical impression: Moderate-severe  oral dysphagia as evidenced  by lingual pumping, weak and delayed propulsion with spontaneous  extension of neck to facilitate oral transit.     Diet Recommendation NPO      Scheduled Meds: . antiseptic oral rinse  7 mL Mouth Rinse q12n4p  . cefTRIAXone (ROCEPHIN)  IV  1 g Intravenous Q24H  . chlorhexidine  15 mL Mouth Rinse BID  . darifenacin  7.5 mg Oral Daily  . docusate sodium  100 mg Oral BID  . feeding supplement (ENSURE COMPLETE)  237 mL Oral BID BM  . fluconazole (DIFLUCAN) IV  100 mg Intravenous Q24H  . furosemide  20 mg Intravenous Q12H  . ramipril  1.25 mg Oral Daily  . sodium chloride  3 mL Intravenous Q12H  . tamsulosin  0.4 mg Oral Daily  . vancomycin  500 mg Intravenous Q12H    Continuous Infusions:    Time spent: 15 minutes  Hollice Espy  [facility] Pager [phone]. If 7PM-7AM, please contact night-coverage at  [email or web address], password [facility] 03/12/2014, 10:09 AM  LOS: 4 days

## 2014-03-13 DIAGNOSIS — E870 Hyperosmolality and hypernatremia: Secondary | ICD-10-CM | POA: Diagnosis not present

## 2014-03-13 LAB — BASIC METABOLIC PANEL
ANION GAP: 16 — AB (ref 5–15)
BUN: 41 mg/dL — ABNORMAL HIGH (ref 6–23)
CHLORIDE: 111 meq/L (ref 96–112)
CO2: 28 mEq/L (ref 19–32)
Calcium: 10.3 mg/dL (ref 8.4–10.5)
Creatinine, Ser: 0.81 mg/dL (ref 0.50–1.35)
GFR calc non Af Amer: 78 mL/min — ABNORMAL LOW (ref >90)
Glucose, Bld: 127 mg/dL — ABNORMAL HIGH (ref 70–99)
Potassium: 3.7 mEq/L (ref 3.7–5.3)
Sodium: 155 mEq/L — ABNORMAL HIGH (ref 137–147)

## 2014-03-13 LAB — CULTURE, BLOOD (ROUTINE X 2)

## 2014-03-13 LAB — VANCOMYCIN, TROUGH: VANCOMYCIN TR: 10.9 ug/mL (ref 10.0–20.0)

## 2014-03-13 LAB — TROPONIN I: Troponin I: <0.3 ng/mL (ref <0.30)

## 2014-03-13 MED ORDER — VANCOMYCIN HCL IN DEXTROSE 750-5 MG/150ML-% IV SOLN
750.0000 mg | Freq: Two times a day (BID) | INTRAVENOUS | Status: DC
  Administered 2014-03-13 – 2014-03-14 (×2): 750 mg via INTRAVENOUS
  Filled 2014-03-13 (×3): qty 150

## 2014-03-13 MED ORDER — CARVEDILOL 3.125 MG PO TABS
3.1250 mg | ORAL_TABLET | Freq: Two times a day (BID) | ORAL | Status: DC
  Administered 2014-03-13 – 2014-03-14 (×3): 3.125 mg via ORAL
  Filled 2014-03-13 (×4): qty 1

## 2014-03-13 NOTE — Progress Notes (Signed)
Agree with previous RNs assessment. Will continue to monitor pt.

## 2014-03-13 NOTE — Progress Notes (Signed)
PROGRESS NOTE  [name] ZOX:[id number] DOB: [birth year] DOA: 03/08/2014 PCP: Leo Grosser, MD  HPI/Recap of past 81 hours: 80 year old male with past medical history of BPH, protein calorie malnutrition and recent swelling of right wrist treated with Bactrim for possible cellulitis from home with caretaker was found down on the floor after caretaker had stepped out. He likely had been down for no more than a few hours. In the emergency room, patient had signs consistent with sepsis felt to be secondary to UTI and also possible cellulitis of wrist. He was started on IV fluids and antibiotics and admitted to the hospitalist service. His hemoglobin was noted to be 8.8 on admission with previous hemoglobins being in the 11-12 range. Seen by hand surgery who  felt that he had a right wrist inflammatory arthropathy consistent with CPPD versus gout. They recommended wrist immobilization and no further imaging or aspiration. On admission, initial troponin level normal but 6 hours later increased to 0.4 and has since normalized  Patient found to have elevated BNP and with elevation, started on Lasix with diuresis of 8 L so far. Seen by speech therapy and found to have dysphagia, confirmed with modified barium swallow is absolute. On 9/5 after noon, initial blood cultures done on admission note one bottle positive for gram-positive rods and patient started on vancomycin.  After updating family about patient's severe dysphagia, he has made him a DO NOT RESUSCITATE. They also understood that he will aspirate regardless. The plan will be to put him on comfort feedings and restricted diet to try to minimize the risk.   Overnight, no major issues. Patient has had little urine output since yesterday. Sodium has started to trend up. All signs of overdiuresis. Has stopped Lasix and rechecking labs now.  Assessment/Plan: Active Problems:   BPH (benign prostatic hyperplasia): Stable, patient continued on  Flomax  Dysphagia: By speech therapy. Signs of severe dysphagia, likely subacute chronic aspiration. Mafter discussion with family, plan will be to put patient on a very restricted diet and family in agreement to not treat aspiration pneumonia as it happens    Inflammatory arthropathy: Seen by hand surgery. As above, stable his hand. No further imaging or aspiration of fluid needed at this time. gout versus CPPD. Farsi brought attention that patient was recalled seeing toxicity, so holding colchicine for now checking uric acid level  Gram-positive bacteremia: Noted one bottle of blood culture from admission positive on 9/5 for gram-positive rods. Awaiting further specification and sensitivities Vancomycin added. Only one bottle and no further specification yet.? Contaminant    Anemia: Curiously, his followup hemoglobin the next day was normal. It has stayed that way since    UTI (lower urinary tract infection): Urine cultures unrevealing. Change antibiotic to IV Rocephin. Noted yeast in repeat UA, so have added Diflucan    Hypokalemia: Secondary dehydration. Mild. Followup potassium level this morning resolved    Sepsis: Stabilized. Patient criteria given elevated temperature, tachycardia and urine as source on admission. Pressures are much improved and his heart rate is normalized. White count staying normal. Blood cultures above-noted    Protein-calorie malnutrition, severe: Patient is criteria in the context of chronic illness as evidenced by severe muscle wasting and subcutaneous fat loss. Seen by nutrition. Started on Ensure complete by mouth twice a day     acute on Chronic diastolic CHF (congestive heart failure): With elevated BNP, started Lasix. Patient has diuresed approximately over 8 L already. Given improvement in renal function, we'll continue to  diurese. He is down 15 pounds since admission.  signs have overdiuresis. We'll stop Lasix. Blood pressure still mildly elevated so have  added low-dose Coreg  Elevated troponin I: Likely from patient's heart failure Although on note, initial level was normal. He may have some mild demand ischemia secondary to underlying sepsis. Treatment of which would be treating the underlying problem. Continue to monitor troponin levels  Code Status: Full code  Family Communication:  updated daughter by phone yesterday   Disposition Plan:  waiting for final blood culture delineation. Suspect he can go home with home health to family tomorrow   Consultants:  Speech therapy  Procedures:  Echocardiogram done 9/3: Grade 1 diastolic dysfunction  Antibiotics:  IV vancomycin 9/2-9/4  IV Zosyn 9/2-9/4  IV Rocephin 9/4-present  HPI/Subjective:  a little more somnolent today.   Objective: BP 149/88  Pulse 98  Temp(Src) 97.7 F (36.5 C) (Oral)  Resp 18  Ht  (1.702 m)  Wt 55.566 kg (122 lb 8 oz)  BMI 19.18 kg/m2  SpO2 93%  Intake/Output Summary (Last 24 hours) at 03/13/14 1227 Last data filed at 03/13/14 1100  Gross per 24 hour  Intake    960 ml  Output   1425 ml  Net   -465 ml   Filed Weights   03/11/14 0500 03/12/14 0622 03/13/14 0547  Weight: 55.8 kg (123 lb 0.3 oz) 54.6 kg (120 lb 5.9 oz) 55.566 kg (122 lb 8 oz)    Exam:   General:  Alert and oriented x2, no acute distress,  at his membranes are dry Cardiovascular: Regular rate and rhythm, S1-S2, soft 2/6 systolic ejection murmur  Respiratory: Clear to auscultation bilaterally  Abdomen: Soft, nontender, nondistended, positive bowel sounds  Musculoskeletal:  swollen wrist looks to be resolved   Data Reviewed: Basic Metabolic Panel:  Recent Labs Lab 03/08/14 1759 03/09/14 0315 03/10/14 0435 03/11/14 0450 03/12/14 0602  NA 140 142 144 146 153*  K 3.5* 3.9 3.4* 3.3* 3.4*  CL 103 105 107 106 110  CO2 GLUCOSE 100* 97 113* 80 87  BUN 27* 31*  CREATININE 0.78 0.73 0.91 0.78 0.81  CALCIUM 8.9 9.3 9.2 9.1 9.6  MG  --  2.1   --   --   --   PHOS  --  2.6  --   --   --    Liver Function Tests:  Recent Labs Lab 03/08/14 1759 03/09/14 0315  AST 42* 163*  ALT 14 43  ALKPHOS 69 69  BILITOT 1.0 1.2  PROT 7.2 7.5  ALBUMIN 3.2* 3.2*   No results found for this basename: LIPASE, AMYLASE,  in the last 168 hours No results found for this basename: AMMONIA,  in the last 168 hours CBC:  Recent Labs Lab 03/08/14 1759 03/09/14 0315 03/11/14 0450  WBC 9.3 8.5 6.4  NEUTROABS 7.4  --   --   HGB 8.8* 12.6* 11.4*  HCT 25.6* 37.5* 34.9*  MCV 95.5 95.7 98.0  PLT 203 156 136*   Cardiac Enzymes:    Recent Labs Lab 03/09/14 0900 03/09/14 1502 03/10/14 0435 03/11/14 0450 03/13/14 0600  TROPONINI 0.41* <0.30 0.42* <0.30 <0.30   BNP (last 3 results)  Recent Labs  03/09/14 1501 03/12/14 0602  PROBNP 3640.0* 409.0   CBG: No results found for this basename: GLUCAP,  in the last 168 hours     Studies: Dg Swallowing Func-speech Pathology  03/11/2014  Clinical impression: Moderate-severe oral dysphagia as evidenced  by lingual pumping, weak and delayed propulsion with spontaneous  extension of neck to facilitate oral transit.     Diet Recommendation NPO      Scheduled Meds: . antiseptic oral rinse  7 mL Mouth Rinse q12n4p  . carvedilol  3.125 mg Oral BID WC  . cefTRIAXone (ROCEPHIN)  IV  1 g Intravenous Q24H  . chlorhexidine  15 mL Mouth Rinse BID  . darifenacin  7.5 mg Oral Daily  . docusate sodium  100 mg Oral BID  . feeding supplement (ENSURE COMPLETE)  237 mL Oral BID BM  . fluconazole (DIFLUCAN) IV  100 mg Intravenous Q24H  . ramipril  1.25 mg Oral Daily  . sodium chloride  3 mL Intravenous Q12H  . tamsulosin  0.4 mg Oral Daily  . vancomycin  500 mg Intravenous Q12H    Continuous Infusions:    Time spent: 25 minutes  Hollice Espy  [facility] Pager [phone]. If 7PM-7AM, please contact night-coverage at [email or web address], password [facility] 03/13/2014, 12:27 PM  LOS: 5 days

## 2014-03-13 NOTE — Progress Notes (Signed)
BRIEF PHARMACY NOTE  Assessment:  69 yoM presents to [facility] with CC of fever and fall on 9/2. Note recent antibiotic course of bactrim for hand cellulitis. Initially started on vancomycin and zosyn for presumed sepsis, then narrowed to rocephin on 9/4. On 9/5, MD added back vancomycin for 1 of 2 blood cultures growing gram positive rods.  9/2 >> Vancomycin >> 9/4 9/2 >> Zosyn >> 9/4 9/4 >> Ceftriaxone >> 9/5 >> Fluconazole >> 9/5 >> Vanc resumed >>  Tmax: AF WBCs: WNL Renal: SCr WNL/stable, CrCl 50CG, 65N   Levels / Dose changes: 9/7 1900 VT: 10.9 on  q12h (SUBtherapeutic)  Goal of Therapy:  Vancomycin trough level 15-20 mcg/ml   Plan:  1. Increase Vancomycin to  IV Q12H  2. Obtain new VT at steady state 3. F/u blood culture results.   Loma Boston, PharmD Pager: [phone] 03/13/2014 7:49 PM

## 2014-03-13 NOTE — Progress Notes (Signed)
ANTIBIOTIC CONSULT NOTE - Follow Up  Pharmacy Consult for Vancomycin Indication: Bacteremia  No Known Allergies  Patient Measurements: Height:  (170.2 cm) Weight: 122 lb 8 oz (55.566 kg) IBW/kg (Calculated) : 66.1  Vital Signs: Temp: 97.7 F (36.5 C) (09/07 0547) Temp src: Oral (09/07 0547) BP: 149/88 mmHg (09/07 0547) Pulse Rate: 98 (09/07 0547) Intake/Output from previous day: 09/06 0701 - 09/07 0700 In: 990 [P.O.:690; IV Piggyback:300] Out: 925 [Urine:925] Intake/Output from this shift: Total I/O In: 120 [P.O.:120] Out: -   Labs:  Recent Labs  03/11/14 0450 03/12/14 0602  WBC 6.4  --   HGB 11.4*  --   PLT 136*  --   CREATININE 0.78 0.81   Estimated Creatinine Clearance: 51.5 ml/min (by C-G formula based on Cr of 0.81). No results found for this basename: Rolm Gala, VANCORANDOM, GENTTROUGH, GENTPEAK, GENTRANDOM, TOBRATROUGH, TOBRAPEAK, TOBRARND, AMIKACINPEAK, AMIKACINTROU, AMIKACIN,  in the last 72 hours    Assessment: 67 yoM presents to [facility] with CC of fever and fall on 9/2. Note recent antibiotic course of bactrim for hand cellulitis. Initially started on vancomycin and zosyn for presumed sepsis, then narrowed to rocephin on 9/4. On 9/5, MD added back vancomycin for 1 of 2 blood cultures growing gram positive rods.  9/2 >> Vancomycin >> 9/4 9/2 >> Zosyn >> 9/4 9/4 >> Ceftriaxone >> 9/5 >> Fluconazole >> 9/5 >> Vanc resumed >>  Tmax: AF WBCs: WNL Renal: SCr WNL/stable, CrCl 50CG, 65N   9/2 blood: 1/2 GPR 9/2 urine: NGF 9/5 blood repeat: ngtd  Today is day #3 Vanc  q12h and day #4 Rocephin for sepsis, possible cellulitis of wrist, positive blood cx. Also, day #3 fluconazole for yeast in repeat UA.  Goal of Therapy:  Vancomycin trough level 15-20 mcg/ml  Plan:  1.  Check vancomycin trough this evening.  Will adjust dose as needed. 2.  F/u blood culture results.  Clance Boll, PharmD, BCPS Pager: [phone] 03/13/2014 11:02  AM

## 2014-03-13 NOTE — Progress Notes (Signed)
Physical Therapy Treatment Patient Details Name: [name] MRN: [id number] DOB: 04/25/1926 Today's Date: 03/13/2014    History of Present Illness This 80 y.o. male admitted after being found down by caregiver.  Diagnosed with sepsis likely from UTI.  Recently diagnosed with cellulitis of wrist - immobilzation and treated with antibiotics    PT Comments    Pt pleasant and cooperative.  Communication continues limited by garbled speech but pt with improved ability to follow cues.  Follow Up Recommendations  Home health PT;SNF     Equipment Recommendations  None recommended by PT    Recommendations for Other Services       Precautions / Restrictions Precautions Precautions: Fall Restrictions Weight Bearing Restrictions: No    Mobility  Bed Mobility Overal bed mobility: Needs Assistance Bed Mobility: Supine to Sit     Supine to sit: Min assist        Transfers Overall transfer level: Needs assistance Equipment used: Rolling walker (2 wheeled) Transfers: Sit to/from Stand Sit to Stand: Min assist;+2 safety/equipment         General transfer comment: cues for transition position and use of UEs to self assist.  Ambulation/Gait Ambulation/Gait assistance: +2 safety/equipment;Min assist Ambulation Distance (Feet): 55 Feet Assistive device: Rolling walker (2 wheeled) Gait Pattern/deviations: Step-through pattern;Decreased step length - right;Decreased step length - left;Shuffle;Trunk flexed Gait velocity: decr   General Gait Details: cues for posture, position from RW and saftey awareness.     Stairs            Wheelchair Mobility    Modified Rankin (Stroke Patients Only)       Balance     Sitting balance-Leahy Scale: Fair Sitting balance - Comments: balanced unassisted at EOB   Standing balance support: Bilateral upper extremity supported Standing balance-Leahy Scale: Fair                      Cognition Arousal/Alertness:  Awake/alert Behavior During Therapy: WFL for tasks assessed/performed   Area of Impairment: Safety/judgement;Following commands       Following Commands: Follows one step commands consistently Safety/Judgement: Decreased awareness of safety   Problem Solving: Slow processing;Decreased initiation;Requires verbal cues;Requires tactile cues General Comments: Requires repetition of cues for follow through    Exercises      General Comments        Pertinent Vitals/Pain Pain Assessment: No/denies pain    Home Living                      Prior Function            PT Goals (current goals can now be found in the care plan section) Acute Rehab PT Goals PT Goal Formulation: Patient unable to participate in goal setting Time For Goal Achievement: 03/25/14 Potential to Achieve Goals: Fair Progress towards PT goals: Progressing toward goals    Frequency  Min 3X/week    PT Plan Current plan remains appropriate    Co-evaluation             End of Session Equipment Utilized During Treatment: Gait belt Activity Tolerance: Patient tolerated treatment well;Patient limited by fatigue Patient left: in chair;with call bell/phone within reach;with chair alarm set     Time: 4098-1191 PT Time Calculation (min): 18 min  Charges:  $Gait Training: 8-22 mins                    G Codes:  [name] 03/13/2014, 10:45 AM

## 2014-03-14 DIAGNOSIS — E870 Hyperosmolality and hypernatremia: Secondary | ICD-10-CM

## 2014-03-14 LAB — BASIC METABOLIC PANEL
ANION GAP: 12 (ref 5–15)
BUN: 35 mg/dL — ABNORMAL HIGH (ref 6–23)
CHLORIDE: 113 meq/L — AB (ref 96–112)
CO2: 30 meq/L (ref 19–32)
CREATININE: 0.74 mg/dL (ref 0.50–1.35)
Calcium: 9.7 mg/dL (ref 8.4–10.5)
GFR calc non Af Amer: 81 mL/min — ABNORMAL LOW (ref >90)
Glucose, Bld: 116 mg/dL — ABNORMAL HIGH (ref 70–99)
Potassium: 3.8 mEq/L (ref 3.7–5.3)
SODIUM: 155 meq/L — AB (ref 137–147)

## 2014-03-14 LAB — CULTURE, BLOOD (ROUTINE X 2): Culture: NO GROWTH

## 2014-03-14 MED ORDER — RAMIPRIL 1.25 MG PO CAPS: 1.2500 mg | ORAL_CAPSULE | Freq: Every day | ORAL | Status: DC

## 2014-03-14 MED ORDER — ENSURE COMPLETE PO LIQD: 237.0000 mL | Freq: Two times a day (BID) | ORAL | Status: DC

## 2014-03-14 MED ORDER — SODIUM CHLORIDE 0.45 % IV SOLN
INTRAVENOUS | Status: DC
  Administered 2014-03-14: 75 mL/h via INTRAVENOUS

## 2014-03-14 MED ORDER — CARVEDILOL 3.125 MG PO TABS: 3.1250 mg | ORAL_TABLET | Freq: Two times a day (BID) | ORAL | Status: DC

## 2014-03-14 MED ORDER — STARCH (THICKENING) PO POWD: ORAL | Status: DC

## 2014-03-14 NOTE — Discharge Summary (Signed)
Discharge Summary  [name] QHU:[id number] DOB: [birth year]  PCP: Odette Fraction, MD  Admit date: 03/08/2014 Discharge date: 03/14/2014  Time spent: 25 minutes  Recommendations for Outpatient Follow-up:  1. New restricted diet: Dysphagia 1 with nectar thick liquids 2. Status change: Patient is now a DO NOT RESUSCITATE 3. Patient going home with home health skilled nursing, physical therapy, speech therapy and aide 4.  New medications: Coreg 3.125 by mouth twice a day plus gram upper 1.25 by mouth daily  Discharge Diagnoses:  Active Hospital Problems   Diagnosis Date Noted  . Acute on chronic diastolic heart failure 46/56/8127  . Hypernatremia 03/13/2014  . Bacteremia due to Gram-positive bacteria 03/12/2014  . Dysphagia, unspecified(787.20) 03/10/2014  . Protein-calorie malnutrition, severe 03/09/2014  . Inflammatory arthropathy 03/09/2014  . Anemia 03/08/2014  . UTI (lower urinary tract infection) 03/08/2014  . Hypokalemia 03/08/2014  . Sepsis 03/08/2014  . Cellulitis of right hand 02/20/2014  . BPH (benign prostatic hyperplasia)     Resolved Hospital Problems   Diagnosis Date Noted Date Resolved  No resolved problems to display.    Discharge Condition: Improved, being discharged home  Diet recommendation: Dysphagia 1 diet with nectar thick liquids plus Ensure complete twice a day  Filed Weights   03/12/14 0622 03/13/14 0547 03/14/14 5170  Weight: 54.6 kg (120 lb 5.9 oz) 55.566 kg (122 lb 8 oz) 53.933 kg (118 lb 14.4 oz)    History of present illness:  80 year old male with past medical history of BPH, protein calorie malnutrition and recent swelling of right wrist treated with Bactrim for possible cellulitis from home with caretaker was found down on the floor after caretaker had stepped out. He likely had been down for no more than a few hours. In the emergency room, patient had signs consistent with sepsis felt to be secondary to UTI and also possible  cellulitis of wrist. He was started on IV fluids and antibiotics and admitted to the hospitalist service. His hemoglobin was noted to be 8.8 on admission with previous hemoglobins being in the 11-12 range.   Hospital Course:  Principal Problem:   Acute on chronic diastolic heart failure: Patient found to have elevated BNP on admission. Echocardiogram confirmed grade 1 diastolic dysfunction. Started on Lasix and he diuresed approximately 8 L. Given restricted diet and hypernatremia, he is at high risk for acute renal failure and dehydration from Lasix so he will not be discharged on any diuretic. See below. ACE inhibitor and beta blocker added to patient's regimen for CHF, measures Active Problems:   BPH (benign prostatic hyperplasia): Stable, patient continued on Flomax.    Hypokalemia: Secondary diuresis. Replace.    Sepsis secondary to UTI: Patient met criteria given elevated temperature, tachycardia and urine as source on admission. With fluids and antibiotics, patient's heart rate normalized. Yeast also noted in urine so added on Diflucan. By day of discharge, patient completed 7 days of antibiotics.    Protein-calorie malnutrition, severe: Patient met criteria in the context of chronic illness as evidenced by severe muscle wasting and subcutaneous fat loss. He was seen by nutrition. Started on Ensure complete twice a day    Inflammatory arthropathy: Seen by hand surgery who felt that he had a right wrist inflammatory arthropathy consistent with CPPD versus gout. They recommended wrist immobilization and no further imaging or aspiration    Dysphagia, unspecified(787.20): See by speech therapy and found to have significant dysphagia confirmed with modified barium swallows absolute risk regardless of restriction. Discussed this with  patient's daughter. After she discussed with rest the family, I will be to put patient on restricted diet, make him DO NOT RESUSCITATE and understand that he is at  high risk for continued aspiration and they will not treat pneumonia which will likely occur.    Bacteremia due to Gram-positive bacteria: Patient's blood culture grew one out of 4 bottles on admission with gram-positive rods which ended up growing out only propriabacter species. I discussed this with infectious disease and likely this was a contaminant.    Hypernatremia: Following diuresis, patient's sodium increased to 155. This is likely secondary to overdiuresis. Started on gentle IV fluids   Procedures:  2-D echo done 9/3 noting grade 1 diastolic dysfunction  Consultations:  Speech therapy  Discharge Exam: BP 109/71  Pulse 86  Temp(Src) 97.8 F (36.6 C) (Oral)  Resp 20  Ht 5' 7"  (1.702 m)  Wt 53.933 kg (118 lb 14.4 oz)  BMI 18.62 kg/m2  SpO2 99%  General: Alert and oriented x2, no acute distress Cardiovascular: Regular rate and rhythm, C8-Y2, soft 2/6 systolic ejection murmur Respiratory: Clear auscultation bilaterally  Discharge Instructions You were cared for by a hospitalist during your hospital stay. If you have any questions about your discharge medications or the care you received while you were in the hospital after you are discharged, you can call the unit and asked to speak with the hospitalist on call if the hospitalist that took care of you is not available. Once you are discharged, your primary care physician will handle any further medical issues. Please note that NO REFILLS for any discharge medications will be authorized once you are discharged, as it is imperative that you return to your primary care physician (or establish a relationship with a primary care physician if you do not have one) for your aftercare needs so that they can reassess your need for medications and monitor your lab values.     Medication List         carvedilol 3.125 MG tablet  Commonly known as:  COREG  Take 1 tablet (3.125 mg total) by mouth 2 (two) times daily with a meal.      feeding supplement (ENSURE COMPLETE) Liqd  Take 237 mLs by mouth 2 (two) times daily between meals.     food thickener Powd  Commonly known as:  THICK IT  Use as directed for all liquids for nectar thick consistency     polyethylene glycol powder powder  Commonly known as:  GLYCOLAX/MIRALAX  Take 17 g by mouth daily as needed for mild constipation.     ramipril 1.25 MG capsule  Commonly known as:  ALTACE  Take 1 capsule (1.25 mg total) by mouth daily.     solifenacin 10 MG tablet  Commonly known as:  VESICARE  Take 1 tablet (10 mg total) by mouth daily.     tamsulosin 0.4 MG Caps capsule  Commonly known as:  FLOMAX  Take 1 capsule (0.4 mg total) by mouth daily.     traMADol 50 MG tablet  Commonly known as:  ULTRAM  Take 1 tablet (50 mg total) by mouth 2 (two) times daily as needed.       No Known Allergies     Follow-up Information   Follow up with [facility] TOM, MD In 1 month. (As needed)    Specialty:  Family Medicine   Contact information:   [address] [place] [address] [phone]  The results of significant diagnostics from this hospitalization (including imaging, microbiology, ancillary and laboratory) are listed below for reference.    Significant Diagnostic Studies: Dg Chest 2 View  03/08/2014   CLINICAL DATA:  FEVER FALL  EXAM: CHEST - 2 VIEW  COMPARISON:  02/17/2013  FINDINGS: Lungs clear.  Heart size upper limits normal.  No pneumothorax. No effusion. Visualized skeletal structures are unremarkable.  IMPRESSION: No acute cardiopulmonary disease.   Electronically Signed   By: Arne Cleveland M.D.   On: 03/08/2014 20:24   Dg Pelvis 1-2 Views  03/08/2014   CLINICAL DATA:  fall  EXAM: PELVIS - 1-2 VIEW  COMPARISON:  02/17/2013  FINDINGS: Orthopedic screws across the left femoral neck as before. Bilateral hip osteoarthritis right worse than left. No acute fracture or dislocation. Bony pelvis intact. Increase in size and number of  lamellated pelvic calcifications suggesting bladder calculi.  IMPRESSION: 1. Negative for fracture, dislocation, or other acute abnormality. 2. Degenerative and postop changes as above.   Electronically Signed   By: Arne Cleveland M.D.   On: 03/08/2014 20:26   Dg Shoulder Right  03/08/2014   CLINICAL DATA:  pain  EXAM: RIGHT SHOULDER - 2+ VIEW  COMPARISON:  None.  FINDINGS: There is no evidence of fracture or dislocation. Calcification in soft tissues near the rotator cuff insertion. No significant osseous degenerative change.  IMPRESSION: 1. Negative for fracture or dislocation. 2. Calcific tendinitis.   Electronically Signed   By: Arne Cleveland M.D.   On: 03/08/2014 20:23   Dg Elbow Complete Right  03/08/2014   CLINICAL DATA:  pain  EXAM: RIGHT ELBOW - COMPLETE 3+ VIEW  COMPARISON:  None.  FINDINGS: There is no evidence of fracture or dislocation. 6 mm corticated ossicle medial to the medial epicondyle. Mild diffuse chondrocalcinosis. There is not a good lateral to exclude effusion. Soft tissues are unremarkable.  IMPRESSION: 1. No fracture or other acute abnormality. 2. Degenerative changes as above.   Electronically Signed   By: Arne Cleveland M.D.   On: 03/08/2014 20:22   Dg Wrist Complete Right  03/08/2014   CLINICAL DATA:  hand swelling and hot  EXAM: RIGHT WRIST - COMPLETE 3+ VIEW  COMPARISON:  None.  FINDINGS: There is no evidence of fracture or dislocation. Chondrocalcinosis in the wrist. Heterotopic calcification at the second, third, and fifth MCP joints. Carpal rows intact. Diffuse osteopenia. Soft tissues are unremarkable.  IMPRESSION: 1. Negative for fracture or other acute bone abnormality. 2. Diffuse osteopenia. 3. Extensive chondrocalcinosis suggesting CPPD.   Electronically Signed   By: Arne Cleveland M.D.   On: 03/08/2014 20:21   Ct Head Wo Contrast  03/08/2014   CLINICAL DATA:  Fall.  Head injury.  EXAM: CT HEAD WITHOUT CONTRAST  CT CERVICAL SPINE WITHOUT CONTRAST  TECHNIQUE:  Multidetector CT imaging of the head and cervical spine was performed following the standard protocol without intravenous contrast. Multiplanar CT image reconstructions of the cervical spine were also generated.  COMPARISON:  Head CT from 02/17/2013  FINDINGS: CT HEAD FINDINGS  There is no evidence for acute hemorrhage, hydrocephalus, mass lesion, or abnormal extra-axial fluid collection. No definite CT evidence for acute infarction. Diffuse loss of parenchymal volume is consistent with atrophy. Patchy low attenuation in the deep hemispheric and periventricular white matter is nonspecific, but likely reflects chronic microvascular ischemic demyelination. No evidence for skull fracture  CT CERVICAL SPINE FINDINGS  Imaging was obtained from the skullbase through the T2 vertebral body. No evidence for  fracture. There is diffuse degenerative disc disease. Trace retrolisthesis of C2 on 3 and C3 on 4 and is compatible with the loss of disc height relative to the preservation of facet space. More advanced facet osteoarthritis is seen at C4-5. No prevertebral soft tissue swelling. Pleural-parenchymal scarring is noted in the lung apices  IMPRESSION: No acute intracranial abnormality. There is atrophy with chronic small vessel white matter ischemic disease.  Degenerative changes in the upper cervical spine without evidence for fracture.   Electronically Signed   By: Misty Stanley M.D.   On: 03/08/2014 19:26   Ct Cervical Spine Wo Contrast  03/08/2014   CLINICAL DATA:  Fall.  Head injury.  EXAM: CT HEAD WITHOUT CONTRAST  CT CERVICAL SPINE WITHOUT CONTRAST  TECHNIQUE: Multidetector CT imaging of the head and cervical spine was performed following the standard protocol without intravenous contrast. Multiplanar CT image reconstructions of the cervical spine were also generated.  COMPARISON:  Head CT from 02/17/2013  FINDINGS: CT HEAD FINDINGS  There is no evidence for acute hemorrhage, hydrocephalus, mass lesion, or abnormal  extra-axial fluid collection. No definite CT evidence for acute infarction. Diffuse loss of parenchymal volume is consistent with atrophy. Patchy low attenuation in the deep hemispheric and periventricular white matter is nonspecific, but likely reflects chronic microvascular ischemic demyelination. No evidence for skull fracture  CT CERVICAL SPINE FINDINGS  Imaging was obtained from the skullbase through the T2 vertebral body. No evidence for fracture. There is diffuse degenerative disc disease. Trace retrolisthesis of C2 on 3 and C3 on 4 and is compatible with the loss of disc height relative to the preservation of facet space. More advanced facet osteoarthritis is seen at C4-5. No prevertebral soft tissue swelling. Pleural-parenchymal scarring is noted in the lung apices  IMPRESSION: No acute intracranial abnormality. There is atrophy with chronic small vessel white matter ischemic disease.  Degenerative changes in the upper cervical spine without evidence for fracture.   Electronically Signed   By: Misty Stanley M.D.   On: 03/08/2014 19:26   Dg Abd 2 Views  03/08/2014   CLINICAL DATA:  FEVER FALL  EXAM: ABDOMEN - 2 VIEW  COMPARISON:  02/17/2013 and earlier studies  FINDINGS: Normal bowel gas pattern. No free air. Visualized lung bases clear. Bilateral hip osteoarthritis right worse than left. Orthopedic screws across the left femoral neck. Bladder calculi.  IMPRESSION: 1. No acute abnormality. 2. Degenerative and postop changes as above.   Electronically Signed   By: Arne Cleveland M.D.   On: 03/08/2014 20:27   Dg Hand Complete Right  03/08/2014   CLINICAL DATA:  Redness and swelling with pain  EXAM: RIGHT HAND - COMPLETE 3+ VIEW  COMPARISON:  None.  FINDINGS: No acute fracture or dislocation is noted. Generalized soft tissue swelling is noted. Diffuse periarticular calcifications are noted particularly at the MCP joints which may represent gouty tophi.  IMPRESSION: No acute fracture or dislocation is  noted. Soft tissue periarticular calcifications are noted which may be related to gout.   Electronically Signed   By: Inez Catalina M.D.   On: 03/08/2014 20:21   Dg Swallowing Func-speech Pathology  03/11/2014   Orbie Pyo Comfrey, CCC-SLP     03/11/2014  4:41 PM Objective Swallowing Evaluation: Bedside swallow evaluation  Patient Details  Name: MAK BONNY MRN: [id number] Date of Birth: Nov 05, 1925  Today's Date: 03/11/2014 Time: 1400-1420 SLP Time Calculation (min): 20 min  Past Medical History:  Past Medical History  Diagnosis Date  .  BPH (benign prostatic hyperplasia)   . GERD (gastroesophageal reflux disease)   . Hyperlipidemia   . Fracture of femoral neck, left 02/17/2013   Past Surgical History:  Past Surgical History  Procedure Laterality Date  . Colon surgery    . Percutaneous pinning Left 02/17/2013    Procedure: PERCUTANEOUS PINNING HIP;  Surgeon: Johnny Bridge,  MD;  Location: [facility];  Service: Orthopedics;  Laterality: Left;   HPI:  80 y.o. male with PMH:  BPH (benign prostatic hyperplasia); GERD,  Hyperlipidemia; and Fracture of femoral neck, left (02/17/2013)  admitted after fall.  Per MD noted physician called for  cellulitis vs CPPD, UTI, sepsis.  CXR no acute cardiopulmonary  disease.  Bedside swallow on 02/18/13 revealed primary esophageal  dysphagia likely and Dys 3, thin recommended.     Assessment / Plan / Recommendation Clinical Impression  Dysphagia Diagnosis: Moderate oral phase dysphagia;Severe oral  phase dysphagia;Severe pharyngeal phase dysphagia Clinical impression: Moderate-severe oral dysphagia as evidenced  by lingual pumping, weak and delayed propulsion with spontaneous  extension of neck to facilitate oral transit.  Pharyngeal phase  characterized by motor impairments with reduced tongue base  retraction, decreased pharyngeal contraction, decreased  hyolaryngeal elevation leading to silent aspiration with  consistencies thinner than puree.  Maximum vallecular residue  increases  aspiration risk with puree and penetration x 1 (flash)  with puree barium. Pt. is at significant aspiration risk with  puree and SLP discussed with MD who will speak with family re:  wishes and plan.  SLP will follow up for decision.       Treatment Recommendation  Therapy as outlined in treatment plan below    Diet Recommendation NPO        Other  Recommendations Oral Care Recommendations: Oral care BID   Follow Up Recommendations  None    Frequency and Duration min 1 x/week  2 weeks   Pertinent Vitals/Pain WDL           Reason for Referral Objectively evaluate swallowing function   Oral Phase Oral Preparation/Oral Phase Oral Phase: Impaired Oral - Honey Oral - Honey Teaspoon: Lingual pumping;Piecemeal  swallowing;Lingual/palatal residue;Reduced posterior  propulsion;Weak lingual manipulation;Delayed oral transit Oral - Honey Cup: Lingual pumping;Piecemeal  swallowing;Lingual/palatal residue;Reduced posterior  propulsion;Weak lingual manipulation;Delayed oral transit Oral - Thin Oral - Thin Cup: Weak lingual manipulation;Piecemeal swallowing Oral - Solids Oral - Puree: Lingual pumping;Piecemeal  swallowing;Lingual/palatal residue;Reduced posterior  propulsion;Weak lingual manipulation;Delayed oral transit   Pharyngeal Phase Pharyngeal Phase Pharyngeal Phase: Impaired Pharyngeal - Honey Pharyngeal - Honey Teaspoon: Pharyngeal residue -  valleculae;Reduced tongue base retraction Pharyngeal - Honey Cup: Pharyngeal residue -  valleculae;Penetration/Aspiration during swallow;Reduced  laryngeal elevation;Reduced airway/laryngeal closure;Reduced  tongue base retraction;Reduced anterior laryngeal  mobility;Reduced pharyngeal peristalsis Penetration/Aspiration details (honey cup): Material enters  airway, passes BELOW cords without attempt by patient to eject  out (silent aspiration) Pharyngeal - Thin Pharyngeal - Thin Cup: Penetration/Aspiration during  swallow;Pharyngeal residue - pyriform sinuses;Pharyngeal residue  -  valleculae;Reduced tongue base retraction;Reduced  airway/laryngeal closure;Reduced laryngeal elevation Penetration/Aspiration details (thin cup): Material enters  airway, passes BELOW cords without attempt by patient to eject  out (silent aspiration) Pharyngeal - Solids Pharyngeal - Puree: Pharyngeal residue - valleculae;Reduced  tongue base retraction;Penetration/Aspiration during  swallow;Reduced airway/laryngeal closure;Reduced laryngeal  elevation;Reduced pharyngeal peristalsis (max) Penetration/Aspiration details (puree): Material enters airway,  remains ABOVE vocal cords then ejected out  Cervical Esophageal Phase    GO    Cervical Esophageal Phase Cervical Esophageal Phase: Impaired (probable  osteophytes  preventing efficient transit to esophag)         Houston Siren 03/11/2014, 4:40 PM Orbie Pyo Colvin Caroli.Ed Engineer, agricultural [phone]      Microbiology: Recent Results (from the past 240 hour(s))  CULTURE, BLOOD (ROUTINE X 2)     Status: None   Collection Time    03/08/14  6:24 PM      Result Value Ref Range Status   Specimen Description BLOOD BLOOD LEFT FOREARM   Final   Special Requests BOTTLES DRAWN AEROBIC AND ANAEROBIC 5CC   Final   Culture  Setup Time     Final   Value: 03/08/2014 23:22     Performed at [facility]   Culture     Final   Value: PROPIONIBACTERIUM SPECIES     Note: Gram Stain Report Called to,Read Back By and Verified With: KRISTEN PHILLIPS 03/11/14 @ 5:16PM BY RUSCOE A.     Performed at [facility]   Report Status 03/13/2014 FINAL   Final  CULTURE, BLOOD (ROUTINE X 2)     Status: None   Collection Time    03/08/14  6:29 PM      Result Value Ref Range Status   Specimen Description BLOOD BLOOD RIGHT FOREARM   Final   Special Requests BOTTLES DRAWN AEROBIC AND ANAEROBIC 5CC   Final   Culture  Setup Time     Final   Value: 03/08/2014 23:22     Performed at [facility]   Culture     Final   Value: NO GROWTH 5 DAYS     Performed at  [facility]   Report Status 03/14/2014 FINAL   Final  URINE CULTURE     Status: None   Collection Time    03/08/14  6:34 PM      Result Value Ref Range Status   Specimen Description URINE, CATHETERIZED   Final   Special Requests Normal   Final   Culture  Setup Time     Final   Value: 03/09/2014 00:10     Performed at [place]     Final   Value: NO GROWTH     Performed at [facility]   Culture     Final   Value: NO GROWTH     Performed at [facility]   Report Status 03/10/2014 FINAL   Final  CULTURE, BLOOD (ROUTINE X 2)     Status: None   Collection Time    03/11/14  5:50 PM      Result Value Ref Range Status   Specimen Description BLOOD LEFT ARM  10 ML IN [facility] BOTTLE   Final   Special Requests NONE   Final   Culture  Setup Time     Final   Value: 03/12/2014 02:06     Performed at [facility]   Culture     Final   Value:        BLOOD CULTURE RECEIVED NO GROWTH TO DATE CULTURE WILL BE HELD FOR 5 DAYS BEFORE ISSUING A FINAL NEGATIVE REPORT     Performed at [facility]   Report Status PENDING   Incomplete  CULTURE, BLOOD (ROUTINE X 2)     Status: None   Collection Time    03/11/14  5:55 PM      Result Value Ref Range Status   Specimen Description BLOOD LEFT HAND  10 ML IN  AEROBIC ONLY   Final   Special Requests NONE   Final   Culture  Setup Time     Final   Value: 03/12/2014 02:06     Performed at [facility]   Culture     Final   Value:        BLOOD CULTURE RECEIVED NO GROWTH TO DATE CULTURE WILL BE HELD FOR 5 DAYS BEFORE ISSUING A FINAL NEGATIVE REPORT     Performed at [facility]   Report Status PENDING   Incomplete     Labs: Basic Metabolic Panel:  Recent Labs Lab 03/08/14 1759 03/09/14 0315 03/10/14 0435 03/11/14 0450 03/12/14 0602 03/13/14 0600 03/14/14 0515  NA 140 142 144 146 153* 155* 155*  K 3.5* 3.9 3.4* 3.3* 3.4* 3.7 3.8  CL 103 105 107 106 110 111 113*  CO2  22 22 25 24 28 28 30   GLUCOSE 100* 97 113* 80 87 127* 116*  BUN 17 16 19  27* 31* 41* 35*  CREATININE 0.78 0.73 0.91 0.78 0.81 0.81 0.74  CALCIUM 8.9 9.3 9.2 9.1 9.6 10.3 9.7  MG  --  2.1  --   --   --   --   --   PHOS  --  2.6  --   --   --   --   --    Liver Function Tests:  Recent Labs Lab 03/08/14 1759 03/09/14 0315  AST 42* 163*  ALT 14 43  ALKPHOS 69 69  BILITOT 1.0 1.2  PROT 7.2 7.5  ALBUMIN 3.2* 3.2*   No results found for this basename: LIPASE, AMYLASE,  in the last 168 hours No results found for this basename: AMMONIA,  in the last 168 hours CBC:  Recent Labs Lab 03/08/14 1759 03/09/14 0315 03/11/14 0450  WBC 9.3 8.5 6.4  NEUTROABS 7.4  --   --   HGB 8.8* 12.6* 11.4*  HCT 25.6* 37.5* 34.9*  MCV 95.5 95.7 98.0  PLT 203 156 136*   Cardiac Enzymes:  Recent Labs Lab 03/09/14 0900 03/09/14 1502 03/10/14 0435 03/11/14 0450 03/13/14 0600  TROPONINI 0.41* <0.30 0.42* <0.30 <0.30   BNP: BNP (last 3 results)  Recent Labs  03/09/14 1501 03/12/14 0602  PROBNP 3640.0* 409.0   CBG: No results found for this basename: GLUCAP,  in the last 168 hours     Signed:  Annita Brod  [facility] 03/14/2014, 10:58 AM

## 2014-03-14 NOTE — Discharge Instructions (Signed)
Dysphagia Level 1 Diet, Pureed °The dysphasia level 1 diet includes foods that are completely pureed and smooth. The foods have a pudding-like texture, such as the texture of pureed pancakes, mashed potatoes, and yogurt. The diet does not include foods with lumps or coarse textures. Liquids should be smooth and may either be thin, nectar-thick, honey-like, or spoon-thick. °This diet is helpful for people with moderate to severe swallowing problems. It reduces the risk of food getting caught in the windpipe, trachea, or lungs. You may need help or supervision during meals while following this diet. °WHAT DO I NEED TO KNOW ABOUT THIS DIET? °Foods °· You may eat foods that are soft and have a pudding-like texture. If a food does not have this texture, you may be able to eat the food after: °¨ Pureeing it. This can be done with a blender or whisk. °¨ Moistening it with liquid. For example, you may have bread if you soak it in milk or syrup. °· Avoid foods that are hard, dry, sticky, chunky, lumpy, or stringy. Also avoid foods with nuts, seeds, raisins, skins, and pulp. °· Do not eat foods that you have to chew. If you have to chew the food, then you cannot eat it. °· Eat a variety of foods to get all the nutrients you need. °Liquids °· You may drink liquids that are smooth. Your health care provider will tell you if you should drink thin or thickened liquids. °· To thicken a liquid, use a food and beverage thickener or a thickening food. Thickened liquids are usually a "pudding-like" consistency. °· Thin liquids include fruit juices, milk, coffee, tea, yogurts, shakes, and similar foods that melt to thin liquid at room temperature. °· Avoid liquids with seeds, pulp, or chunks. °See your dietitian or health care provider regularly for help with your dietary changes. °WHAT FOODS CAN I EAT? °Grains °Store-bought soft breads, pancakes, and French toast that have a smooth, moist texture and do not have nuts or seeds (you  will need to moisten the food with liquid). Cooked cereals that have a pudding-like consistency, such as cream of wheat or farina (no oatmeal). Pureed, well-cooked pasta, rice, and plain bread stuffing. °Vegetables °Pureed vegetables. Soft avocado. Smooth tomato paste or sauce. Strained or pureed soups (these may need to be thickened as directed). Mashed or pureed potatoes without skin (can be seasoned with butter, smooth gravy, margarine, or sour cream). °Fruits °Pureed fruits such as melons and apples without seeds or pulp. Mashed bananas. Smooth tomato paste or sauce. Fruit juices without pulp or seeds. Strained or pureed soups. °Meat and Other Protein Sources °Pureed meat. Smooth pate or liverwurst. Smooth souffles. Pureed beans (such as lentils). Pureed eggs. °Dairy °Yogurt. Smooth cheese sauces. Milk (may need to be thickened). Nutritional dairy drinks or shakes. Ask your health care provider whether you can have ice cream. °Condiments °Finely ground salt, pepper, and other ground spices. °Sweets/Desserts °Smooth puddings and custards. Pureed desserts. Souffles. Whipped topping. Ask your health care provider whether you can have frozen desserts. °Fats and Oils °Butter. Margarine. Smooth and strained gravy. Sour cream. Mayonnaise. Cream cheese. Whipped topping. Smooth sauces (such as white sauce, cheese sauce, or hollandaise sauce). °The items listed above may not be a complete list of recommended foods or beverages. Contact your dietitian for more options. °WHAT FOODS ARE NOT RECOMMENDED? °Grains °Oatmeal. Dry cereals. Hard breads. °Vegetables °Whole vegetables. Stringy vegetables (such as celery). Thin tomato sauce. °Fruits °Whole fresh, frozen, canned, or dried fruits that have   not been pureed. Stringy fruits (such as pineapple). °Meat and Other Protein Sources °Whole or ground meat, fish, or poultry. Dried or cooked lentils or legumes that have been cooked but not mashed or pureed. Non-pureed eggs. Nuts and  seeds. Peanut butter. °Dairy °Non-pureed cheese. Dairy products with lumps or chunks. Ask your health care provider whether you can have ice cream. °Condiments °Coarse or seeded herbs and spices. °Sweets/Desserts °Chunky preserves. Jams with seeds. Solid desserts. Sticky, chewy sweets (such as licorice and caramel). Ask your health care provider whether you can have frozen desserts. °Fats and Oils °Sauces of fats with lumps or chunks. °The items listed above may not be a complete list of foods and beverages to avoid. Contact your dietitian for more information. °Document Released: 06/23/2005 Document Revised: 11/07/2013 Document Reviewed: 06/06/2013 °Exit[employer]® Patient Information ©2015 [employer]. This information is not intended to replace advice given to you by your health care provider. Make sure you discuss any questions you have with your health care provider. ° °

## 2014-03-14 NOTE — Progress Notes (Signed)
Speech Language Pathology Treatment: Dysphagia  Patient Details Name: [name] MRN: [id number] DOB: 1925/09/19 Today's Date: 03/14/2014 Time: 8295-6213 SLP Time Calculation (min): 14 min  Assessment / Plan / Recommendation Clinical Impression  Pt resting in bed. No family present. Pt reported he was cold. SLP retrieved a warm blanket for pt. No family present at this time for education. RN present. Per MBS, pt demonstrated silent aspiration of all consistencies thinner than puree (03/11/14), however, family has decided to continue with comfort feeds with knowledge of high aspiration risk. SLP to return for family education as able.  SLP left information re: continuing with po intake with known risks of aspiration in the interim.   HPI HPI: 80 y.o. male with PMH:  BPH (benign prostatic hyperplasia); GERD, Hyperlipidemia; and Fracture of femoral neck, left (02/17/2013) admitted after fall.  Per MD noted physician called for cellulitis vs CPPD, UTI, sepsis.  CXR no acute cardiopulmonary disease.  MBS completed 03/11/14, with recommendation for NPO status. SLP in today for education regarding continuing po intake with known risks of aspiration.   Pertinent Vitals Faces Pain Scale: No hurt  SLP Plan  Continue with current plan of care (SLP to recheck later for family presence.)    Recommendations Diet recommendations: NPO (per MBS, due to silent aspiration across consistencies thinner than puree)              Oral Care Recommendations: Oral care BID Follow up Recommendations: None Plan: Continue with current plan of care (SLP to recheck later for family presence.)    GO   [name] B. Murvin Natal [facility], CCC-SLP [phone]  Leigh Aurora 03/14/2014, 12:55 PM

## 2014-03-14 NOTE — Progress Notes (Signed)
Discharge to home, son at bedside, d/c instructions for dysphagia diet and aspiration precaution discussed and  hand outs given   . PIV removed by NT no s/s of infiltration or swelling.

## 2014-03-14 NOTE — Progress Notes (Signed)
[facility]   [facility] is providing the following services: Hospital Bed  If patient discharges after hours, please call [phone].   [name] 03/14/2014, 11:41 AM

## 2014-03-14 NOTE — Progress Notes (Signed)
Occupational Therapy Treatment Patient Details Name: [name] MRN: [id number] DOB: [birth year] Today's Date: 03/14/2014    History of present illness This 80 y.o. male admitted after being found down by caregiver.  Diagnosed with sepsis likely from UTI.  Recently diagnosed with cellulitis of wrist - immobilzation and treated with antibiotics   OT comments  Pt very cooperative.  Fatiques easily.  Asked to lie back down after brushing teeth.    Follow Up Recommendations  SNF;Supervision/Assistance - 24 hour    Equipment Recommendations  3 in 1 bedside comode    Recommendations for Other Services      Precautions / Restrictions Precautions Precautions: Fall       Mobility Bed Mobility         Supine to sit: Min assist     General bed mobility comments: Min A for LEs and trunk to eob.  Pt scooted himself with min guard  Transfers                      Balance     Sitting balance-Leahy Scale: Fair Sitting balance - Comments: maintained balance most of the time--lifted feet from floor and needed cues/occasional min A                           ADL       Grooming: Set up;Sitting;Oral care;Minimal assistance (occasional min A for balance.  Pt used bil UEs for activity)                                        Vision                     Perception     Praxis      Cognition   Behavior During Therapy: [facility] for tasks assessed/performed                    General Comments: pt fearful of falling    Extremity/Trunk Assessment               Exercises     Shoulder Instructions       General Comments      Pertinent Vitals/ Pain       Faces Pain Scale: No hurt  Home Living                                          Prior Functioning/Environment              Frequency       Progress Toward Goals  OT Goals(current goals can now be found in the care plan section)  Progress  towards OT goals: Progressing toward goals     Plan      Co-evaluation                 End of Session     Activity Tolerance Patient limited by fatigue   Patient Left in bed;with call bell/phone within reach;with bed alarm set   Nurse Communication          Time: 4098-1191 OT Time Calculation (min): 17 min  Charges: OT General Charges $OT Visit: 1 Procedure OT Treatments $Self Care/Home Management : 8-22 mins  Chris Cripps 03/14/2014, 3:08 PM   Marica Otter, OTR/L [phone] 03/14/2014

## 2014-03-14 NOTE — Progress Notes (Signed)
CARE MANAGEMENT NOTE 03/14/2014  Patient:  [name], [name]   Account Number:  [id number]  Date Initiated:  03/10/2014  Documentation initiated by:  Ching Rabideau,COOKIE  Subjective/Objective Assessment:   pt admitted with confused, UTI     Action/Plan:   from home   Anticipated DC Date:  03/13/2014   Anticipated DC Plan:  HOME W HOME HEALTH SERVICES  In-house referral  Clinical Social Worker      DC Planning Services  CM consult      Choice offered to / List presented to:          [facility] arranged  HH-1 RN  HH-2 PT  HH-4 NURSE'S AIDE      HH agency  [facility]   Status of service:  In process, will continue to follow Medicare Important Message given?  YES (If response is "NO", the following Medicare IM given date fields will be blank) Date Medicare IM given:  03/10/2014 Medicare IM given by:  Ezekiel Ina Date Additional Medicare IM given:  03/12/2014 Additional Medicare IM given by:  Isidoro Donning  Discharge Disposition:    Per UR Regulation:  Reviewed for med. necessity/level of care/duration of stay  If discussed at Long Length of Stay Meetings, dates discussed:    Comments:  9/815 MMcGibboney, RN, BSN Spoke with daughter [phone].  She states pt will not need a hospital bed, however would like HHRN/NA/PT with [facility]. If MD agrees, will need orders for HHRN/NA/PT.  03/12/2014 1600 NCM spoke to dtr, [place]. Requested [facility] for [facility]. States pt will need hospital bed at home. Isidoro Donning RN CCM Case Mgmt phone [phone]  03/11/2014 1445 NCM spoke to pt's son, Zaydin Billey. States to speak to sister, [name]. Offered choice for [facility]. Left list HH agency in the room with son. States he will speak to his sister. Isidoro Donning RN CCM Case Mgmt phone [phone]  03/10/14 MMcGibboney, RN, BSN Spoke with pt's daughter at bedside concerning opposition for discharge, Home Health or SNF.

## 2014-03-15 ENCOUNTER — Encounter: Payer: Self-pay | Admitting: Family Medicine

## 2014-03-17 ENCOUNTER — Telehealth: Payer: Self-pay | Admitting: Family Medicine

## 2014-03-17 NOTE — Telephone Encounter (Signed)
Call placed to patient daughter Burna Mortimer.   States that she wanted to inform MD that patient will be going to [facility] at this time.

## 2014-03-17 NOTE — Telephone Encounter (Signed)
[phone]  Patients daughter calling to talk with someone regarding his hospital visit and his care now  Please call wanda back

## 2014-03-17 NOTE — Telephone Encounter (Signed)
Thanks, noted

## 2014-03-18 LAB — CULTURE, BLOOD (ROUTINE X 2)
CULTURE: NO GROWTH
Culture: NO GROWTH

## 2014-03-20 ENCOUNTER — Other Ambulatory Visit: Payer: Self-pay | Admitting: *Deleted

## 2014-03-20 ENCOUNTER — Encounter: Payer: Self-pay | Admitting: Internal Medicine

## 2014-03-20 ENCOUNTER — Non-Acute Institutional Stay (SKILLED_NURSING_FACILITY): Payer: Medicare Other | Admitting: Internal Medicine

## 2014-03-20 DIAGNOSIS — M064 Inflammatory polyarthropathy: Secondary | ICD-10-CM

## 2014-03-20 DIAGNOSIS — I5033 Acute on chronic diastolic (congestive) heart failure: Secondary | ICD-10-CM

## 2014-03-20 DIAGNOSIS — N390 Urinary tract infection, site not specified: Secondary | ICD-10-CM

## 2014-03-20 DIAGNOSIS — R1310 Dysphagia, unspecified: Secondary | ICD-10-CM

## 2014-03-20 DIAGNOSIS — E43 Unspecified severe protein-calorie malnutrition: Secondary | ICD-10-CM

## 2014-03-20 DIAGNOSIS — M1990 Unspecified osteoarthritis, unspecified site: Secondary | ICD-10-CM

## 2014-03-20 MED ORDER — TRAMADOL HCL 50 MG PO TABS: ORAL_TABLET | ORAL | Status: DC

## 2014-03-20 NOTE — Assessment & Plan Note (Signed)
Seen by hand surgery who felt that he had a right wrist inflammatory arthropathy consistent with CPPD versus gout. They recommended wrist immobilization and no further imaging or aspiration

## 2014-03-20 NOTE — Assessment & Plan Note (Signed)
2/2 UTI as above

## 2014-03-20 NOTE — Assessment & Plan Note (Signed)
Patient met criteria in the context of chronic illness as evidenced by severe muscle wasting and subcutaneous fat loss. He was seen by nutrition. Started on Ensure complete twice a day

## 2014-03-20 NOTE — Progress Notes (Signed)
MRN: [id number] Name: [name]  Sex: male Age: 80 y.o. DOB: Apr 26, 1926  [place] #: Helene Kelp Facility/Room: 112 Level Of Care: SNF Provider: Inocencio Homes D Emergency Contacts: Extended Emergency Contact Information Primary Emergency Contact: Manon Hilding [place] Phone: [phone] Mobile Phone: [phone] Relation: Daughter Secondary Emergency Contact: Guinevere Scarlet [place] Phone: [phone] Mobile Phone: [phone] Relation: Son  Code Status: DNR  Allergies: Review of patient's allergies indicates no known allergies.  Chief Complaint  Patient presents with  . New Admit To SNF    HPI: Patient is 80 y.o. male who is admitted to SNF after tx for urosepsis for OT/PT.  Past Medical History  Diagnosis Date  . BPH (benign prostatic hyperplasia)   . GERD (gastroesophageal reflux disease)   . Hyperlipidemia   . Fracture of femoral neck, left 02/17/2013    Past Surgical History  Procedure Laterality Date  . Colon surgery    . Percutaneous pinning Left 02/17/2013    Procedure: PERCUTANEOUS PINNING HIP;  Surgeon: Johnny Bridge, MD;  Location: [facility];  Service: Orthopedics;  Laterality: Left;      Medication List       This list is accurate as of: 03/20/14  7:16 PM.  Always use your most recent med list.               carvedilol 3.125 MG tablet  Commonly known as:  COREG  Take 1 tablet (3.125 mg total) by mouth 2 (two) times daily with a meal.     feeding supplement (ENSURE COMPLETE) Liqd  Take 237 mLs by mouth 2 (two) times daily between meals.     food thickener Powd  Commonly known as:  THICK IT  Use as directed for all liquids for nectar thick consistency     polyethylene glycol powder powder  Commonly known as:  GLYCOLAX/MIRALAX  Take 17 g by mouth daily as needed for mild constipation.     ramipril 1.25 MG capsule  Commonly known as:  ALTACE  Take 1 capsule (1.25 mg total) by mouth daily.      solifenacin 10 MG tablet  Commonly known as:  VESICARE  Take 1 tablet (10 mg total) by mouth daily.     tamsulosin 0.4 MG Caps capsule  Commonly known as:  FLOMAX  Take 1 capsule (0.4 mg total) by mouth daily.     traMADol 50 MG tablet  Commonly known as:  ULTRAM  Take one tablet by mouth twice daily as needed for pain        No orders of the defined types were placed in this encounter.    Immunization History  Administered Date(s) Administered  . Pneumococcal Conjugate-13 02/23/2014  . Pneumococcal Polysaccharide-23 02/21/2013    History  Substance Use Topics  . Smoking status: Never Smoker   . Smokeless tobacco: Not on file  . Alcohol Use: No    Family history is noncontributory    Review of Systems  DATA OBTAINED: from patient GENERAL: Feels well no fevers, fatigue, appetite changes SKIN: No itching, rash or wounds EYES: No eye pain, redness, discharge EARS: No earache, tinnitus, change in hearing NOSE: No congestion, drainage or bleeding  MOUTH/THROAT: No mouth or tooth pain RESPIRATORY: No cough, wheezing, SOB CARDIAC: No chest pain, palpitations, lower extremity edema  GI: No abdominal pain, No N/V/D or constipation, No heartburn or reflux  GU: No dysuria, frequency or urgency, or incontinence  MUSCULOSKELETAL: c/o a little of R  shoulder pain NEUROLOGIC: No headache, dizziness or focal weakness PSYCHIATRIC: No overt anxiety or sadness. Sleeps well. No behavior issue.   Filed Vitals:   03/20/14 1907  BP: 103/67  Pulse: 88  Temp: 97.1 F (36.2 C)  Resp: 18    Physical Exam  GENERAL APPEARANCE: Alert, mod conversant. Appropriately groomed. No acute distress.  SKIN: No diaphoresis rash HEAD: Normocephalic, atraumatic  EYES: Conjunctiva/lids clear. Pupils round, reactive. EOMs intact.  EARS: External exam WNL, canals clear. Hearing grossly normal.  NOSE: No deformity or discharge.  MOUTH/THROAT: Lips w/o lesions  RESPIRATORY: Breathing is even,  unlabored. Lung sounds are clear   CARDIOVASCULAR: Heart RRR no murmurs, rubs or gallops. No peripheral edema.   GASTROINTESTINAL: Abdomen is soft, non-tender, not distended w/ normal bowel sounds GENITOURINARY: Bladder non tender, not distended  MUSCULOSKELETAL: No abnormal joints or musculature NEUROLOGIC:  Cranial nerves 2-12 grossly intact. Moves all extremities no tremor. PSYCHIATRIC: Mood and affect appropriate to situation, no behavioral issues  Patient Active Problem List   Diagnosis Date Noted  . Hypernatremia 03/13/2014  . Bacteremia due to Gram-positive bacteria 03/12/2014  . Dysphagia, unspecified(787.20) 03/10/2014  . Acute on chronic diastolic heart failure 16/04/9603  . Protein-calorie malnutrition, severe 03/09/2014  . Chronic diastolic CHF (congestive heart failure) 03/09/2014  . Inflammatory arthropathy 03/09/2014  . Anemia 03/08/2014  . UTI (lower urinary tract infection) 03/08/2014  . Hypokalemia 03/08/2014  . Sepsis 03/08/2014  . Cellulitis of right hand 02/20/2014  . Chronic ulcer of toe of right foot with fat layer exposed 02/20/2014  . Fracture of femoral neck, left 02/17/2013  . BPH (benign prostatic hyperplasia)   . GERD (gastroesophageal reflux disease)   . Hyperlipidemia     CBC    Component Value Date/Time   WBC 6.4 03/11/2014 0450   RBC 3.56* 03/11/2014 0450   RBC 3.69* 03/08/2014 2100   HGB 11.4* 03/11/2014 0450   HCT 34.9* 03/11/2014 0450   PLT 136* 03/11/2014 0450   MCV 98.0 03/11/2014 0450   LYMPHSABS 0.8 03/08/2014 1759   MONOABS 1.1* 03/08/2014 1759   EOSABS 0.0 03/08/2014 1759   BASOSABS 0.0 03/08/2014 1759    CMP     Component Value Date/Time   NA 155* 03/14/2014 0515   K 3.8 03/14/2014 0515   CL 113* 03/14/2014 0515   CO2 30 03/14/2014 0515   GLUCOSE 116* 03/14/2014 0515   BUN 35* 03/14/2014 0515   CREATININE 0.74 03/14/2014 0515   CREATININE 0.67 02/16/2014 1031   CALCIUM 9.7 03/14/2014 0515   PROT 7.5 03/09/2014 0315   ALBUMIN 3.2* 03/09/2014 0315   AST 163*  03/09/2014 0315   ALT 43 03/09/2014 0315   ALKPHOS 69 03/09/2014 0315   BILITOT 1.2 03/09/2014 0315   GFRNONAA 81* 03/14/2014 0515   GFRNONAA 87 02/16/2014 1031   GFRAA >90 03/14/2014 0515   GFRAA >89 02/16/2014 1031    Assessment and Plan  Acute on chronic diastolic heart failure Patient found to have elevated BNP on admission. Echocardiogram confirmed grade 1 diastolic dysfunction. Started on Lasix and he diuresed approximately 8 L. Given restricted diet and hypernatremia, he is at high risk for acute renal failure and dehydration from Lasix so he will not be discharged on any diuretic. See below. ACE inhibitor and beta blocker added to patient's regimen for CHF, measures   UTI (lower urinary tract infection) Patient met criteria given elevated temperature, tachycardia and urine as source on admission. With fluids and antibiotics, patient's heart rate normalized. Yeast  also noted in urine so added on Diflucan. By day of discharge, patient completed 7 days of antibiotics   Sepsis 2/2 UTI as above  Protein-calorie malnutrition, severe Patient met criteria in the context of chronic illness as evidenced by severe muscle wasting and subcutaneous fat loss. He was seen by nutrition. Started on Ensure complete twice a day   Inflammatory arthropathy Seen by hand surgery who felt that he had a right wrist inflammatory arthropathy consistent with CPPD versus gout. They recommended wrist immobilization and no further imaging or aspiration   Dysphagia, unspecified(787.20) See by speech therapy and found to have significant dysphagia confirmed with modified barium swallows absolute risk regardless of restriction. Discussed this with patient's daughter. After she discussed with rest the family, I will be to put patient on restricted diet, make him DO NOT RESUSCITATE and understand that he is at high risk for continued aspiration and they will not treat pneumonia which will likely occur.     Hennie Duos, MD

## 2014-03-20 NOTE — Telephone Encounter (Signed)
[facility] 

## 2014-03-20 NOTE — Assessment & Plan Note (Signed)
See by speech therapy and found to have significant dysphagia confirmed with modified barium swallows absolute risk regardless of restriction. Discussed this with patient's daughter. After she discussed with rest the family, I will be to put patient on restricted diet, make him DO NOT RESUSCITATE and understand that he is at high risk for continued aspiration and they will not treat pneumonia which will likely occur.

## 2014-03-20 NOTE — Assessment & Plan Note (Signed)
Patient met criteria given elevated temperature, tachycardia and urine as source on admission. With fluids and antibiotics, patient's heart rate normalized. Yeast also noted in urine so added on Diflucan. By day of discharge, patient completed 7 days of antibiotics

## 2014-03-20 NOTE — Assessment & Plan Note (Signed)
Patient found to have elevated BNP on admission. Echocardiogram confirmed grade 1 diastolic dysfunction. Started on Lasix and he diuresed approximately 8 L. Given restricted diet and hypernatremia, he is at high risk for acute renal failure and dehydration from Lasix so he will not be discharged on any diuretic. See below. ACE inhibitor and beta blocker added to patient's regimen for CHF, measures

## 2014-04-07 ENCOUNTER — Non-Acute Institutional Stay (SKILLED_NURSING_FACILITY): Payer: Medicare Other | Admitting: Nurse Practitioner

## 2014-04-07 DIAGNOSIS — R1310 Dysphagia, unspecified: Secondary | ICD-10-CM

## 2014-04-07 DIAGNOSIS — N390 Urinary tract infection, site not specified: Secondary | ICD-10-CM

## 2014-04-07 DIAGNOSIS — R5381 Other malaise: Secondary | ICD-10-CM

## 2014-04-07 DIAGNOSIS — I5032 Chronic diastolic (congestive) heart failure: Secondary | ICD-10-CM

## 2014-04-07 DIAGNOSIS — I10 Essential (primary) hypertension: Secondary | ICD-10-CM

## 2014-04-07 DIAGNOSIS — N400 Enlarged prostate without lower urinary tract symptoms: Secondary | ICD-10-CM

## 2014-04-07 NOTE — Progress Notes (Signed)
Patient ID: [name], male   DOB: May 08, 1926, 80 y.o.   MRN: [id number]    Nursing Home Location:  [facility]   Place of Service: SNF (31)  PCP: Leo GrosserPICKARD,WARREN TOM, MD  No Known Allergies  Chief Complaint  Patient presents with  . Discharge Note    HPI:  [name] is 80 y.o. male with a pmh of BPH, protein calorie malnutrition who is admitted to SNF after tx for urosepsis for OT/PT. Pt stay at [facility] has been without acute issues. Patient currently doing well with therapy, now stable to discharge home with home health and family support.   Review of Systems:  Review of Systems  Constitutional: Negative for activity change, appetite change, fatigue and unexpected weight change.  HENT: Negative for congestion and hearing loss.   Eyes: Negative.   Respiratory: Negative for cough and shortness of breath.   Cardiovascular: Negative for chest pain, palpitations and leg swelling.  Gastrointestinal: Negative for abdominal pain, diarrhea and constipation.  Genitourinary: Negative for dysuria and difficulty urinating.  Musculoskeletal: Negative for arthralgias and myalgias.  Skin: Negative for color change and wound.  Neurological: Negative for dizziness and weakness.  Psychiatric/Behavioral: Negative for behavioral problems, confusion and agitation.    Past Medical History  Diagnosis Date  . BPH (benign prostatic hyperplasia)   . GERD (gastroesophageal reflux disease)   . Hyperlipidemia   . Fracture of femoral neck, left 02/17/2013   Past Surgical History  Procedure Laterality Date  . Colon surgery    . Percutaneous pinning Left 02/17/2013    Procedure: PERCUTANEOUS PINNING HIP;  Surgeon: Eulas PostJoshua P Landau, MD;  [facility];  Service: Orthopedics;  Laterality: Left;   Social History:   reports that he has never smoked. He does not have any smokeless tobacco history on file. He reports that he does not drink alcohol or use illicit drugs.  Family History   Problem Relation Age of Onset  . Hypertension Sister   . Migraines Sister     Medications: Patient's Medications  New Prescriptions   No medications on file  Previous Medications   CARVEDILOL (COREG) 3.125 MG TABLET    Take 1 tablet (3.125 mg total) by mouth 2 (two) times daily with a meal.   FEEDING SUPPLEMENT, ENSURE COMPLETE, (ENSURE COMPLETE) LIQD    Take 237 mLs by mouth 2 (two) times daily between meals.   FOOD THICKENER (THICK IT) POWD    Use as directed for all liquids for nectar thick consistency   POLYETHYLENE GLYCOL POWDER (GLYCOLAX/MIRALAX) POWDER    Take 17 g by mouth daily as needed for mild constipation.   RAMIPRIL (ALTACE) 1.25 MG CAPSULE    Take 1 capsule (1.25 mg total) by mouth daily.   SOLIFENACIN (VESICARE) 10 MG TABLET    Take 1 tablet (10 mg total) by mouth daily.   TAMSULOSIN (FLOMAX) 0.4 MG CAPS CAPSULE    Take 1 capsule (0.4 mg total) by mouth daily.   TRAMADOL (ULTRAM) 50 MG TABLET    Take one tablet by mouth twice daily as needed for pain  Modified Medications   No medications on file  Discontinued Medications   No medications on file     Physical Exam: Filed Vitals:   04/07/14 1437  BP: 152/78  Pulse: 96  Temp: 98 F (36.7 C)  Resp: 20    Physical Exam GENERAL APPEARANCE: Alert, mod conversant. Appropriately groomed. No acute distress.  SKIN: No diaphoresis rash  HEAD: Normocephalic,  atraumatic  EYES: Conjunctiva/lids clear. Pupils round, reactive. EOMs intact.  EARS: External exam WNL, canals clear. Hearing grossly normal.  NOSE: No deformity or discharge.  MOUTH/THROAT: Lips w/o lesions  RESPIRATORY: Breathing is even, unlabored. Lung sounds are clear  CARDIOVASCULAR: Heart RRR. No peripheral edema.  GASTROINTESTINAL: Abdomen is soft, non-tender, not distended w/ normal bowel sounds  GENITOURINARY: Bladder non tender, not distended  MUSCULOSKELETAL: No abnormal joints or musculature  NEUROLOGIC: Cranial nerves 2-12 grossly intact. Moves  all extremities no tremor.  PSYCHIATRIC: Mood and affect appropriate to situation, no behavioral issues  Labs reviewed: Basic Metabolic Panel:  Recent Labs  16/10/96 1759 03/09/14 0315  03/12/14 0602 03/13/14 0600 03/14/14 0515  NA 140 142  < > 153* 155* 155*  K 3.5* 3.9  < > 3.4* 3.7 3.8  CL 103 105  < > 110 111 113*  CO2 22 22  < > 28 28 30   GLUCOSE 100* 97  < > 87 127* 116*  BUN 17 16  < > 31* 41* 35*  CREATININE 0.78 0.73  < > 0.81 0.81 0.74  CALCIUM 8.9 9.3  < > 9.6 10.3 9.7  MG  --  2.1  --   --   --   --   PHOS  --  2.6  --   --   --   --   < > = values in this interval not displayed. Liver Function Tests:  Recent Labs  02/16/14 1031 03/08/14 1759 03/09/14 0315  AST 16 42* 163*  ALT 11 14 43  ALKPHOS 55 69 69  BILITOT 0.8 1.0 1.2  PROT 6.9 7.2 7.5  ALBUMIN 3.4* 3.2* 3.2*   No results found for this basename: LIPASE, AMYLASE,  in the last 8760 hours No results found for this basename: AMMONIA,  in the last 8760 hours CBC:  Recent Labs  02/16/14 1031 03/08/14 1759 03/09/14 0315 03/11/14 0450  WBC 6.3 9.3 8.5 6.4  NEUTROABS 4.5 7.4  --   --   HGB 12.3* 8.8* 12.6* 11.4*  HCT 35.4* 25.6* 37.5* 34.9*  MCV 94.1 95.5 95.7 98.0  PLT 199 203 156 136*   TSH:  Recent Labs  03/09/14 0315  TSH 3.280   A1C: Lab Results  Component Value Date   HGBA1C 5.1 03/09/2014   Lipid Panel:  Recent Labs  02/16/14 1031  CHOL 130  HDL 41  LDLCALC 78  TRIG 55  CHOLHDL 3.2     Assessment/Plan 1. Chronic diastolic CHF (congestive heart failure) -remains stable off medications  2. Dysphagia conts on precautions. conts on dysphagia 1 with thick it to make fluids nectar thick.    3. BPH (benign prostatic hyperplasia) -stable on vesicare and flomax  4. UTI (lower urinary tract infection) Resolved  5. Essential hypertension Stable for discharge, conts on coreg and altace,   6. Debility -has improved while at [facility] with therapy and pt is stable for  discharge-will need PT/OT per home health. DME needed includes WC, tub seat, 3:1. BSC, rolling walker. Rx written.  will need to follow up with PCP within 2 weeks.

## 2014-04-15 DIAGNOSIS — D649 Anemia, unspecified: Secondary | ICD-10-CM

## 2014-04-15 DIAGNOSIS — E43 Unspecified severe protein-calorie malnutrition: Secondary | ICD-10-CM

## 2014-04-15 DIAGNOSIS — R269 Unspecified abnormalities of gait and mobility: Secondary | ICD-10-CM

## 2014-04-15 DIAGNOSIS — I5032 Chronic diastolic (congestive) heart failure: Secondary | ICD-10-CM

## 2014-04-15 DIAGNOSIS — M6281 Muscle weakness (generalized): Secondary | ICD-10-CM

## 2014-04-17 ENCOUNTER — Inpatient Hospital Stay: Payer: Medicare Other | Admitting: Family Medicine

## 2014-04-17 ENCOUNTER — Ambulatory Visit (INDEPENDENT_AMBULATORY_CARE_PROVIDER_SITE_OTHER): Payer: Medicare Other | Admitting: Family Medicine

## 2014-04-17 ENCOUNTER — Encounter: Payer: Self-pay | Admitting: Family Medicine

## 2014-04-17 VITALS — BP 126/74 | HR 74 | Temp 97.9°F | Resp 20 | Ht 67.0 in | Wt 123.0 lb

## 2014-04-17 DIAGNOSIS — Z9189 Other specified personal risk factors, not elsewhere classified: Secondary | ICD-10-CM | POA: Insufficient documentation

## 2014-04-17 DIAGNOSIS — Z09 Encounter for follow-up examination after completed treatment for conditions other than malignant neoplasm: Secondary | ICD-10-CM

## 2014-04-17 DIAGNOSIS — R300 Dysuria: Secondary | ICD-10-CM

## 2014-04-17 DIAGNOSIS — I5189 Other ill-defined heart diseases: Secondary | ICD-10-CM | POA: Insufficient documentation

## 2014-04-17 NOTE — Progress Notes (Signed)
Subjective:    Patient ID: [name], male    DOB: 07-24-1925, 80 y.o.   MRN: [id number]  HPI  3 weeks ago, the patient was discharged from [facility] after one week of hospitalization for urosepsis. He also had diastolic congestive heart failure with an elevated BNP. Patient was treated appropriately with antibiotics for urosepsis and his urosepsis resolved. He was diuresed with Lasix approximately 4-8 L and developed hypernatremia. Lasix was discontinued prior to discharge to a skilled nursing facility. He was discharged to a skilled nursing facility for physical therapy and occupational therapy. He was discharged on carvedilol as well as [place] for his congestive heart failure. The decision was made to ablate diuretics due to the risk of dehydration. Patient is on he had his flu shot and pneumonia shot. However in the hospital, workup revealed that the patient is at high risk for aspiration regardless of precautions. The family has discussed this and has decided to continue feeding the patient regardless. They're currently feeding him that thick liquids and pured food try to minimize the risk of aspiration although they noted that there is risk of aspiration even with these precautions. The patient is taking 3 cans of Ensure per day. Physical therapy is coming to the house to work with the patient. They're scheduled to see a Child psychotherapistsocial worker later this week. He denies any fever or dysuria. He does have some pain in his right shoulder which he is currently managing using tramadol. Past Medical History  Diagnosis Date  . BPH (benign prostatic hyperplasia)   . GERD (gastroesophageal reflux disease)   . Hyperlipidemia   . Fracture of femoral neck, left 02/17/2013  . Diastolic dysfunction   . Aspiration precautions    Past Surgical History  Procedure Laterality Date  . Colon surgery    . Percutaneous pinning Left 02/17/2013    Procedure: PERCUTANEOUS PINNING HIP;  Surgeon: Eulas PostJoshua P  Landau, MD;  [facility];  Service: Orthopedics;  Laterality: Left;   Current Outpatient Prescriptions on File Prior to Visit  Medication Sig Dispense Refill  . carvedilol (COREG) 3.125 MG tablet Take 1 tablet (3.125 mg total) by mouth 2 (two) times daily with a meal.  60 tablet  1  . feeding supplement, ENSURE COMPLETE, (ENSURE COMPLETE) LIQD Take 237 mLs by mouth 2 (two) times daily between meals.  60 Bottle  1  . food thickener (THICK IT) POWD Use as directed for all liquids for nectar thick consistency  850 g  4  . polyethylene glycol powder (GLYCOLAX/MIRALAX) powder Take 17 g by mouth daily as needed for mild constipation.      . ramipril (ALTACE) 1.25 MG capsule Take 1 capsule (1.25 mg total) by mouth daily.  30 capsule  1  . solifenacin (VESICARE) 10 MG tablet Take 1 tablet (10 mg total) by mouth daily.  30 tablet  3  . tamsulosin (FLOMAX) 0.4 MG CAPS capsule Take 1 capsule (0.4 mg total) by mouth daily.  30 capsule  11  . traMADol (ULTRAM) 50 MG tablet Take one tablet by mouth twice daily as needed for pain  60 tablet  5   No current facility-administered medications on file prior to visit.   No Known Allergies History   Social History  . Marital Status: Married    Spouse Name: N/A    Number of Children: N/A  . Years of Education: N/A   Occupational History  . Not on file.   Social History Main  Topics  . Smoking status: Never Smoker   . Smokeless tobacco: Not on file  . Alcohol Use: No  . Drug Use: No  . Sexual Activity: No   Other Topics Concern  . Not on file   Social History Narrative  . No narrative on file   Family History  Problem Relation Age of Onset  . Hypertension Sister   . Migraines Sister      Review of Systems  All other systems reviewed and are negative.      Objective:   Physical Exam  Vitals reviewed. Constitutional: He appears well-developed and well-nourished.  Cardiovascular: Normal rate, regular rhythm and normal heart sounds.     No murmur heard. Pulmonary/Chest: Effort normal and breath sounds normal. No respiratory distress. He has no wheezes. He has no rales. He exhibits no tenderness.  Abdominal: Soft. Bowel sounds are normal. He exhibits no distension and no mass. There is no tenderness. There is no rebound and no guarding.  Musculoskeletal: He exhibits no edema.          Assessment & Plan:  Hospital discharge follow-up - Plan: CBC with Differential, COMPLETE METABOLIC PANEL WITH GFR   At the present time there is no evidence of fluid overload on exam. The patient does not appear dehydrated although he does have protein calorie malnutrition. I encouraged 3 cans of Ensure per day to help corrfect his protein cal malnutrition.  I will also check a urinalysis given occasional dysuria that he reports to rule out urinary tract infection. I would have a low threshold for treating the patient presumptively for aspiration pneumonia should he develop worsening cough.  Recheck in 3 months or as needed.

## 2014-04-18 ENCOUNTER — Other Ambulatory Visit: Payer: Self-pay | Admitting: Family Medicine

## 2014-04-18 LAB — CBC WITH DIFFERENTIAL/PLATELET
BASOS PCT: 1 % (ref 0–1)
Basophils Absolute: 0.1 10*3/uL (ref 0.0–0.1)
Eosinophils Absolute: 0.1 10*3/uL (ref 0.0–0.7)
Eosinophils Relative: 1 % (ref 0–5)
HEMATOCRIT: 35.1 % — AB (ref 39.0–52.0)
HEMOGLOBIN: 12 g/dL — AB (ref 13.0–17.0)
LYMPHS PCT: 34 % (ref 12–46)
Lymphs Abs: 1.8 10*3/uL (ref 0.7–4.0)
MCH: 31.9 pg (ref 26.0–34.0)
MCHC: 34.2 g/dL (ref 30.0–36.0)
MCV: 93.4 fL (ref 78.0–100.0)
MONO ABS: 0.6 10*3/uL (ref 0.1–1.0)
MONOS PCT: 11 % (ref 3–12)
NEUTROS ABS: 2.9 10*3/uL (ref 1.7–7.7)
NEUTROS PCT: 53 % (ref 43–77)
Platelets: 216 10*3/uL (ref 150–400)
RBC: 3.76 MIL/uL — AB (ref 4.22–5.81)
RDW: 14 % (ref 11.5–15.5)
WBC: 5.4 10*3/uL (ref 4.0–10.5)

## 2014-04-18 LAB — COMPLETE METABOLIC PANEL WITH GFR
ALK PHOS: 67 U/L (ref 39–117)
ALT: 8 U/L (ref 0–53)
AST: 14 U/L (ref 0–37)
Albumin: 3.4 g/dL — ABNORMAL LOW (ref 3.5–5.2)
BUN: 15 mg/dL (ref 6–23)
CO2: 26 mEq/L (ref 19–32)
Calcium: 8.9 mg/dL (ref 8.4–10.5)
Chloride: 104 mEq/L (ref 96–112)
Creat: 0.89 mg/dL (ref 0.50–1.35)
GFR, EST NON AFRICAN AMERICAN: 77 mL/min
GFR, Est African American: 89 mL/min
GLUCOSE: 87 mg/dL (ref 70–99)
POTASSIUM: 4.3 meq/L (ref 3.5–5.3)
Sodium: 138 mEq/L (ref 135–145)
TOTAL PROTEIN: 6.5 g/dL (ref 6.0–8.3)
Total Bilirubin: 0.4 mg/dL (ref 0.2–1.2)

## 2014-04-18 NOTE — Addendum Note (Signed)
Addended by: Phillips OdorSIX, CHRISTINA H on: 04/18/2014 04:44 PM   Modules accepted: Orders

## 2014-04-19 LAB — URINALYSIS, ROUTINE W REFLEX MICROSCOPIC
Bilirubin Urine: NEGATIVE
GLUCOSE, UA: NEGATIVE mg/dL
KETONES UR: NEGATIVE mg/dL
Nitrite: NEGATIVE
PH: 7 (ref 5.0–8.0)
PROTEIN: NEGATIVE mg/dL
Specific Gravity, Urine: 1.012 (ref 1.005–1.030)
Urobilinogen, UA: 0.2 mg/dL (ref 0.0–1.0)

## 2014-04-19 LAB — URINALYSIS, MICROSCOPIC ONLY
CASTS: NONE SEEN
Crystals: NONE SEEN
Squamous Epithelial / LPF: NONE SEEN
WBC, UA: >50 WBC/hpf — AB (ref <3)

## 2014-04-22 LAB — URINE CULTURE

## 2014-05-02 IMAGING — CR DG WRIST COMPLETE 3+V*L*
4 series · 4 of 4 positions shown · non-contrast
Comparison: None.

CLINICAL DATA: Left wrist pain.

EXAM:
LEFT WRIST - COMPLETE 3+ VIEW

[view not recorded (1 of 4)]
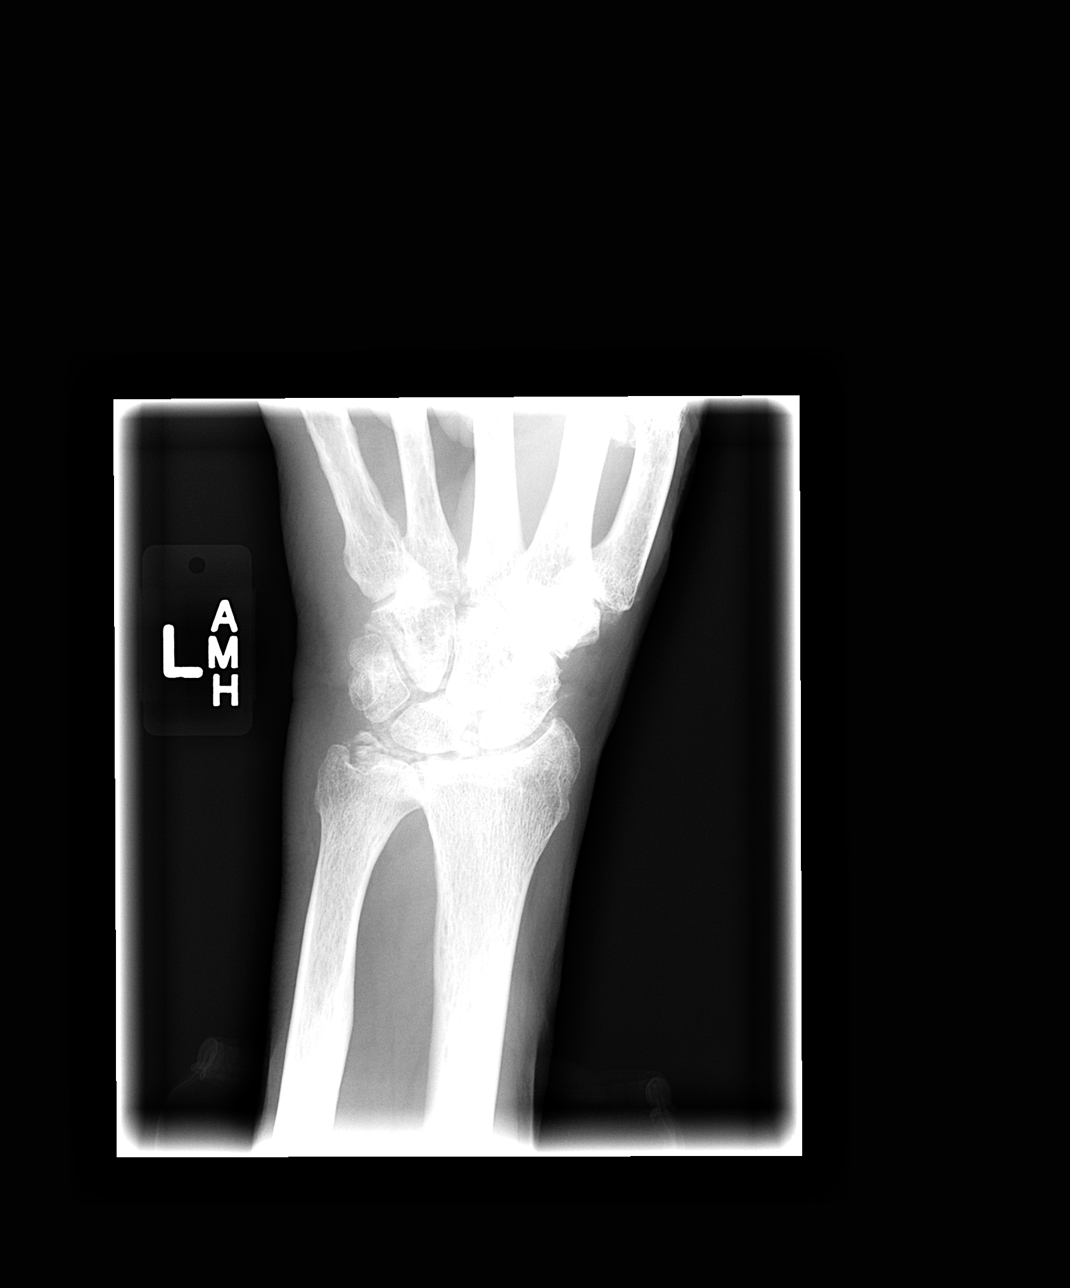

[view not recorded (2 of 4)]
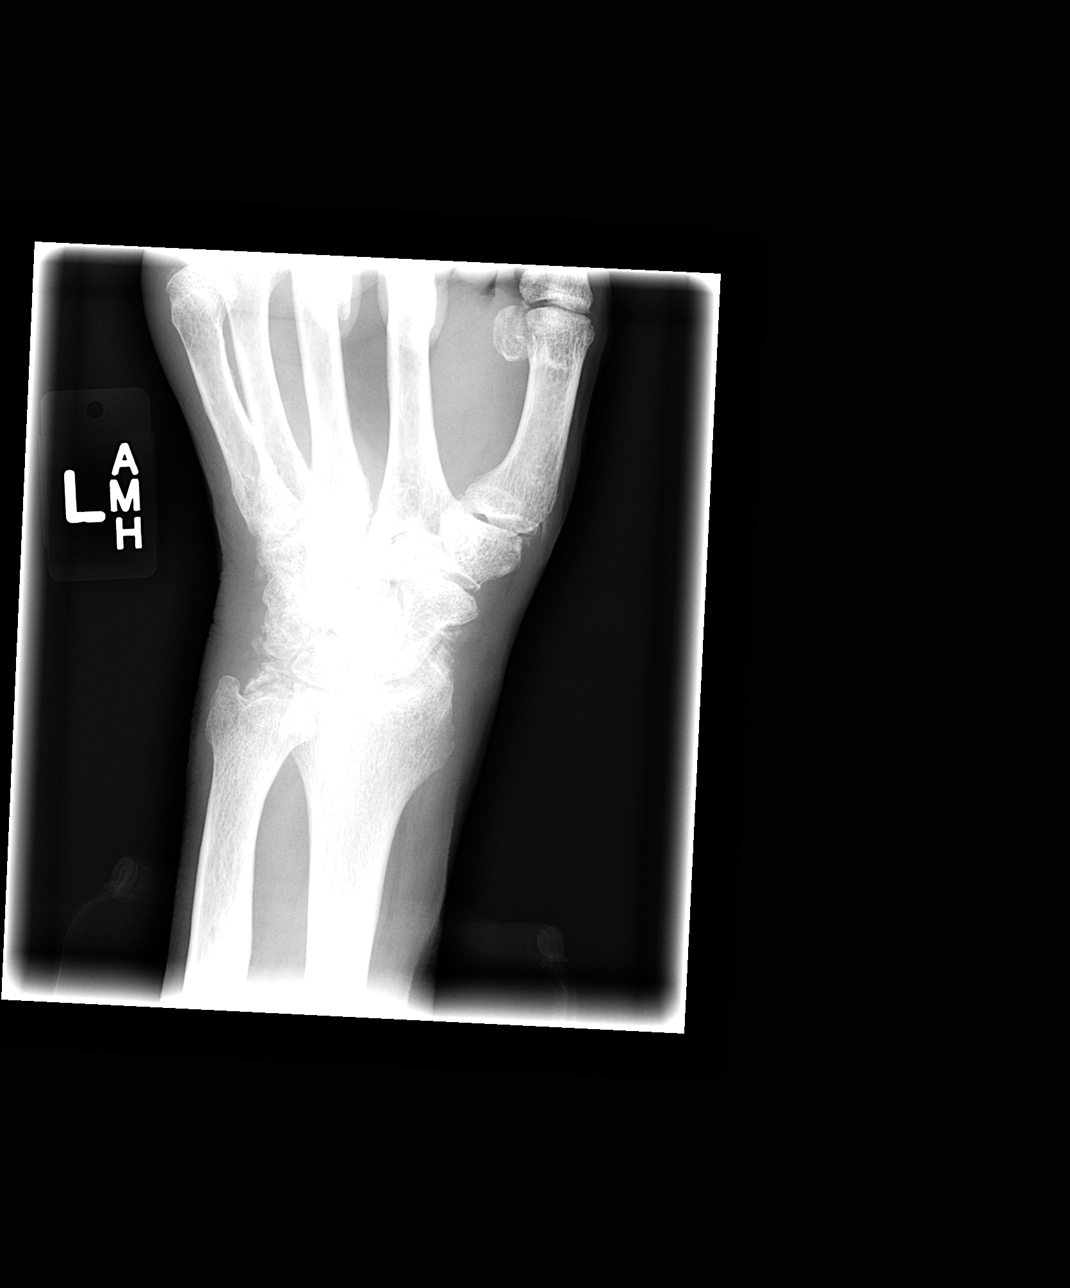

[view not recorded (3 of 4)]
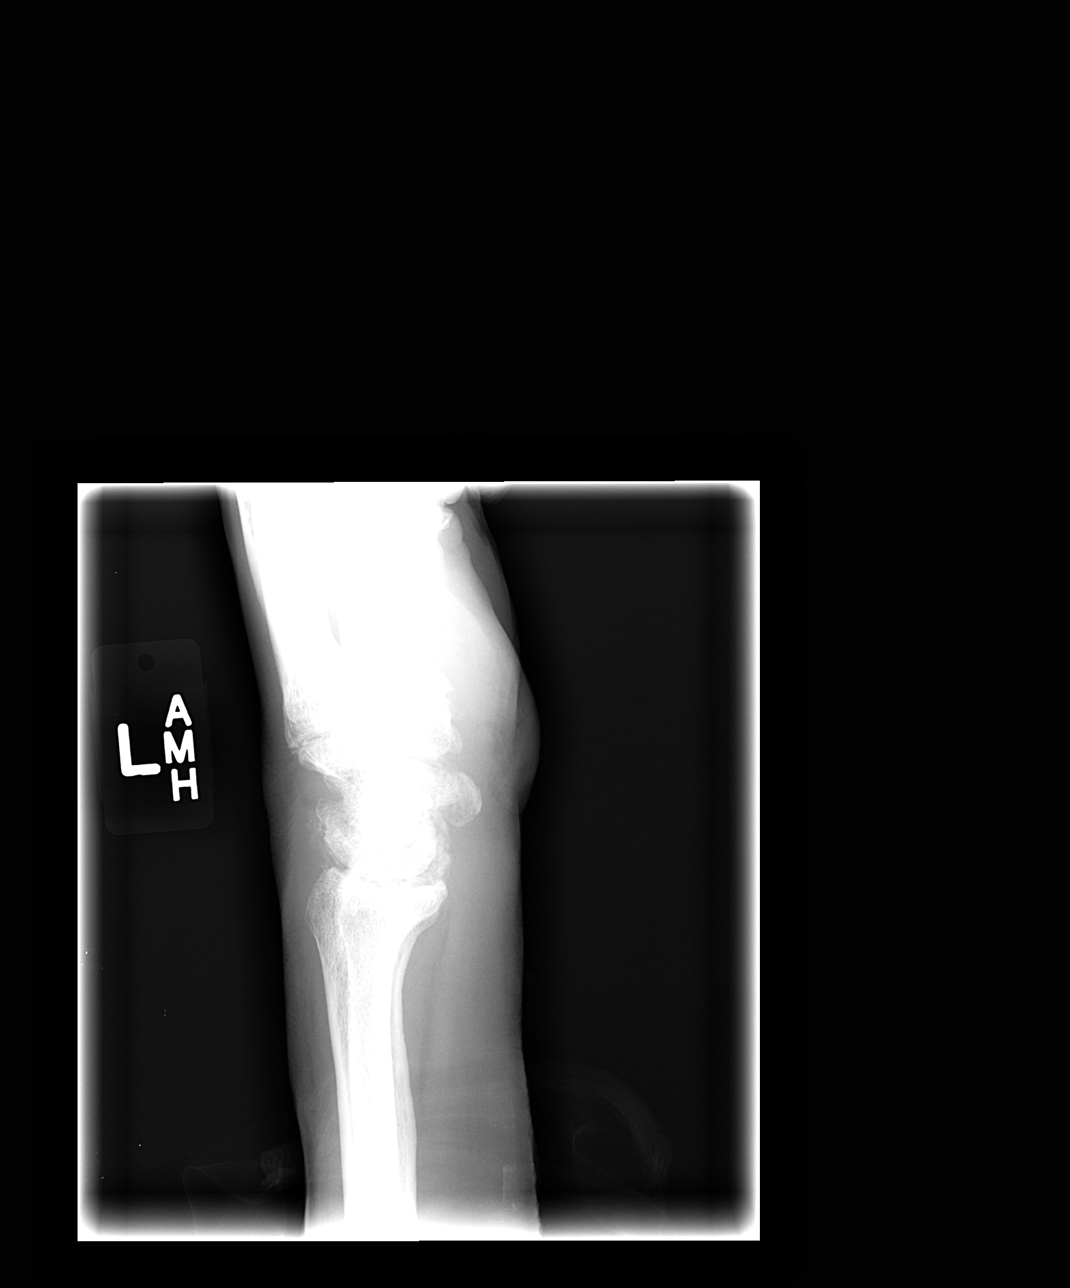

[view not recorded (4 of 4)]
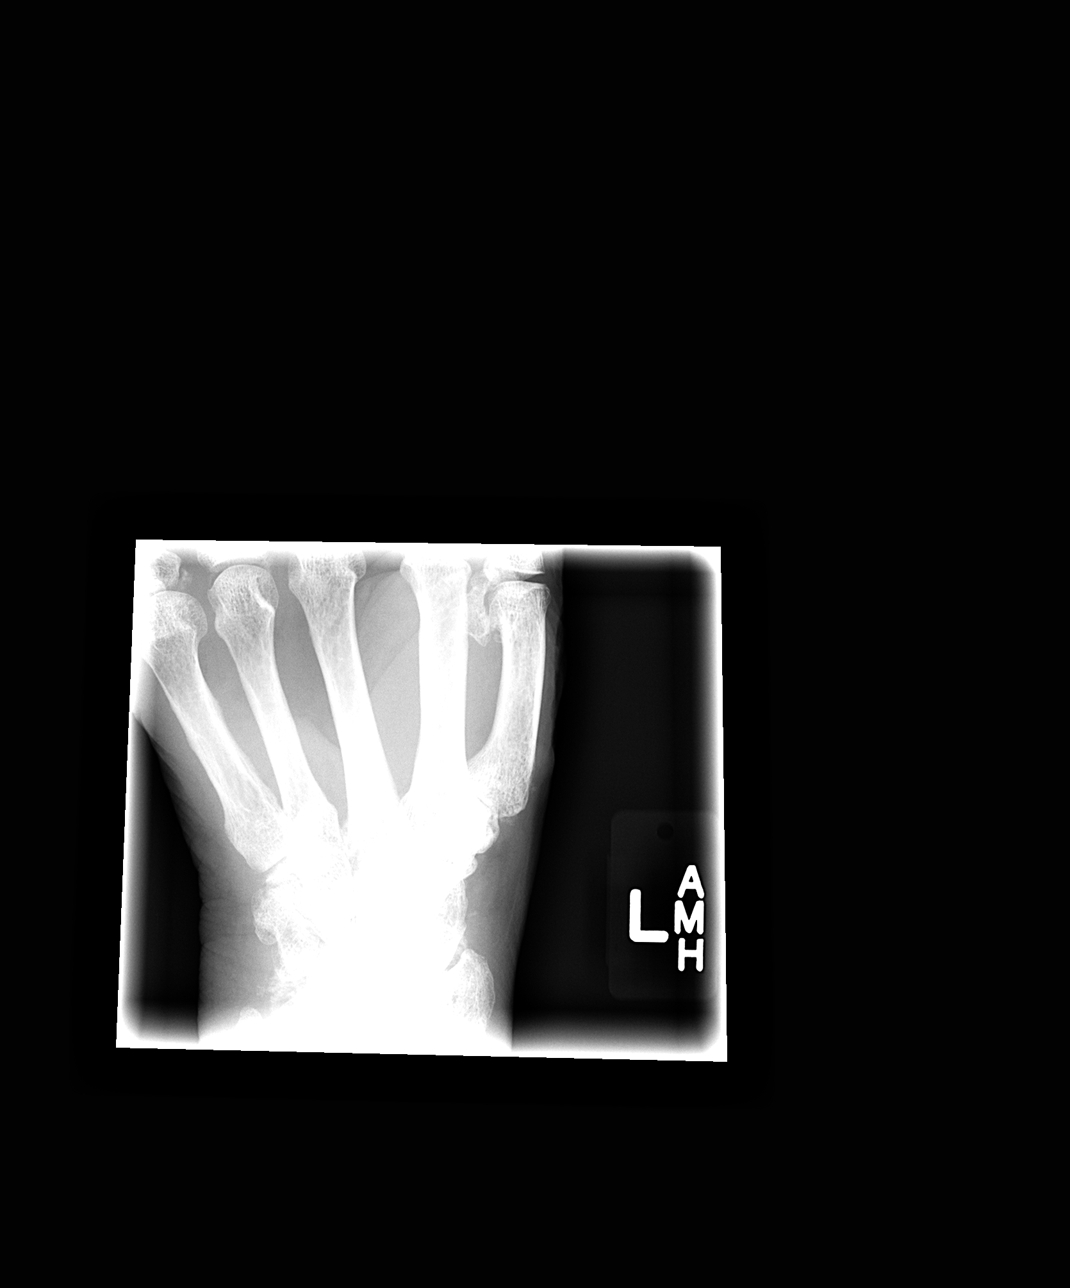

[4 of 4 positions shown; findings below may reference images not displayed]

FINDINGS: Soft tissue swelling is present over the ulnar and dorsal aspect of
the left wrist. Diffuse chondrocalcinosis is present. There is
widening of the scapholunate joint. No acute osseous abnormality is
evident.
IMPRESSION: 1. Diffuse chondrocalcinosis. This is most commonly seen in the
setting of CPPD.
2. Diffuse soft tissue swelling it is most evident over the ulnar
and dorsal aspects of the wrist. This could be posttraumatic or
related to infection. Inflammatory arthropathy is considered less
likely.
3. No acute osseous abnormality.

## 2014-05-19 ENCOUNTER — Ambulatory Visit: Payer: Medicare Other | Admitting: Family Medicine

## 2014-05-24 ENCOUNTER — Ambulatory Visit: Payer: Medicare Other | Admitting: Podiatry

## 2014-05-29 ENCOUNTER — Other Ambulatory Visit: Payer: Self-pay | Admitting: Family Medicine

## 2014-07-10 ENCOUNTER — Telehealth: Payer: Self-pay | Admitting: *Deleted

## 2014-07-10 DIAGNOSIS — R310 Gross hematuria: Secondary | ICD-10-CM | POA: Diagnosis not present

## 2014-07-10 DIAGNOSIS — R350 Frequency of micturition: Secondary | ICD-10-CM | POA: Diagnosis not present

## 2014-07-10 DIAGNOSIS — N401 Enlarged prostate with lower urinary tract symptoms: Secondary | ICD-10-CM | POA: Diagnosis not present

## 2014-07-10 DIAGNOSIS — N210 Calculus in bladder: Secondary | ICD-10-CM | POA: Diagnosis not present

## 2014-07-10 NOTE — Telephone Encounter (Signed)
Received call from patient spouse.   Reports that she would like to get Life Alert Necklace for patient.   Advised that patient or spouse will need to contact company that will be offering Life Alert to get more information. Also advised that it most likely is not covered by insurance.   Verbalized understanding.

## 2014-07-18 ENCOUNTER — Ambulatory Visit: Payer: 59 | Admitting: Family Medicine

## 2014-07-24 ENCOUNTER — Other Ambulatory Visit: Payer: Self-pay | Admitting: Family Medicine

## 2014-07-24 MED ORDER — RAMIPRIL 1.25 MG PO CAPS: ORAL_CAPSULE | ORAL | Status: DC

## 2014-07-24 MED ORDER — CARVEDILOL 3.125 MG PO TABS: ORAL_TABLET | ORAL | Status: DC

## 2014-07-24 NOTE — Telephone Encounter (Signed)
Med sent to pharm 

## 2014-09-11 ENCOUNTER — Ambulatory Visit (INDEPENDENT_AMBULATORY_CARE_PROVIDER_SITE_OTHER): Payer: Medicare Other | Admitting: Podiatry

## 2014-09-11 DIAGNOSIS — Q828 Other specified congenital malformations of skin: Secondary | ICD-10-CM | POA: Diagnosis not present

## 2014-09-11 DIAGNOSIS — B351 Tinea unguium: Secondary | ICD-10-CM | POA: Diagnosis not present

## 2014-09-11 DIAGNOSIS — M79676 Pain in unspecified toe(s): Secondary | ICD-10-CM | POA: Diagnosis not present

## 2014-09-12 NOTE — Progress Notes (Signed)
Patient ID: George SchaumannGordon Q Horsey, male   DOB: February 29, 1926, 81 y.o.   MRN: [id number]  Subjective: This patient presents today complaining of painful toenails and plantar keratoses. His niece is present in the treatment room today  Objective: The toenails are hypertrophic elongated, discolored, and tender to palpation 6-10 Nucleated plantar keratoses right and left total of 4 lesions  Assessment: Debridement toenails 10 and keratoses 4 without a bleeding  Reappoint 3 months

## 2014-10-26 ENCOUNTER — Ambulatory Visit: Payer: Medicare Other | Admitting: Family Medicine

## 2014-11-14 ENCOUNTER — Encounter: Payer: Self-pay | Admitting: Family Medicine

## 2014-11-14 ENCOUNTER — Ambulatory Visit (INDEPENDENT_AMBULATORY_CARE_PROVIDER_SITE_OTHER): Payer: Medicare Other | Admitting: Family Medicine

## 2014-11-14 ENCOUNTER — Telehealth: Payer: Self-pay | Admitting: Family Medicine

## 2014-11-14 VITALS — BP 98/56 | HR 78 | Temp 98.0°F | Resp 16

## 2014-11-14 DIAGNOSIS — N410 Acute prostatitis: Secondary | ICD-10-CM | POA: Diagnosis not present

## 2014-11-14 MED ORDER — TAMSULOSIN HCL 0.4 MG PO CAPS: 0.4000 mg | ORAL_CAPSULE | Freq: Every day | ORAL | Status: DC

## 2014-11-14 MED ORDER — SILODOSIN 8 MG PO CAPS: 8.0000 mg | ORAL_CAPSULE | Freq: Every day | ORAL | Status: DC

## 2014-11-14 MED ORDER — CIPROFLOXACIN HCL 500 MG PO TABS: 500.0000 mg | ORAL_TABLET | Freq: Two times a day (BID) | ORAL | Status: DC

## 2014-11-14 NOTE — Progress Notes (Signed)
   Subjective:    Patient ID: [name], male    DOB: [birth year], 81 y.o.   MRN: [id number]  HPI  Patient is a frail 81 yo AAM who presents with worsening weakness, difficulty urinating.  For the last week, the patient has only been able to void small amounts.  He urinates often but he only dribbles. This is a sudden change over the last week. He also complains of constipation. He is very deconditioned. He is unable to stand without maximum assistance. I literally had to pick him up to put him on the exam table in order to perform a prostate exam. On exam his prostate is swollen +2. It is tender. There is no fecal impaction Past Medical History  Diagnosis Date  . BPH (benign prostatic hyperplasia)   . GERD (gastroesophageal reflux disease)   . Hyperlipidemia   . Fracture of femoral neck, left 02/17/2013  . Diastolic dysfunction   . Aspiration precautions    Past Surgical History  Procedure Laterality Date  . Colon surgery    . Percutaneous pinning Left 02/17/2013    Procedure: PERCUTANEOUS PINNING HIP;  Surgeon: Eulas PostJoshua P Landau, MD;  [facility];  Service: Orthopedics;  Laterality: Left;   Current Outpatient Prescriptions on File Prior to Visit  Medication Sig Dispense Refill  . carvedilol (COREG) 3.125 MG tablet TAKE 1 TABLET (3.125 MG TOTAL) BY MOUTH 2 (TWO) TIMES DAILY WITH A MEAL. 180 tablet 3  . feeding supplement, ENSURE COMPLETE, (ENSURE COMPLETE) LIQD Take 237 mLs by mouth 2 (two) times daily between meals. 60 Bottle 1  . food thickener (THICK IT) POWD Use as directed for all liquids for nectar thick consistency 850 g 4  . ramipril (ALTACE) 1.25 MG capsule TAKE 1 CAPSULE (1.25 MG TOTAL) BY MOUTH DAILY. 90 capsule 3   No current facility-administered medications on file prior to visit.   No Known Allergies History   Social History  . Marital Status: Married    Spouse Name: N/A  . Number of Children: N/A  . Years of Education: N/A   Occupational History  . Not on  file.   Social History Main Topics  . Smoking status: Never Smoker   . Smokeless tobacco: Not on file  . Alcohol Use: No  . Drug Use: No  . Sexual Activity: No   Other Topics Concern  . Not on file   Social History Narrative     Review of Systems  All other systems reviewed and are negative.      Objective:   Physical Exam  Cardiovascular: Normal rate, regular rhythm and normal heart sounds.   Pulmonary/Chest: Effort normal and breath sounds normal. No respiratory distress. He has no wheezes. He has no rales.  Abdominal: Soft. Bowel sounds are normal.  Genitourinary: Prostate is enlarged and tender.  Vitals reviewed.         Assessment & Plan:  Acute prostatitis - Plan: silodosin (RAPAFLO) 8 MG CAPS capsule, ciprofloxacin (CIPRO) 500 MG tablet  Patient has urinary retention I suspect due to acute prostatitis. Continue finasteride. Start Rapaflo 8 mg by mouth daily. Add Cipro 500 mg by mouth twice a day for at least 2 weeks. Recheck here in one week for reassessment. Go to the emergency room if complete and total urinary retention occurs. Point, the patient is still making urine and therefore does not  Require a catheter. Hopefully we can manage this conservatively.  Take MiraLAX daily for constipation.

## 2014-11-14 NOTE — Telephone Encounter (Signed)
Daughter called.  Says Rapaflo is $200.  Can not afford.  Per provider switch to Flomax.  Rx to pharmacy.

## 2014-11-21 ENCOUNTER — Encounter: Payer: Self-pay | Admitting: Family Medicine

## 2014-11-21 ENCOUNTER — Ambulatory Visit (INDEPENDENT_AMBULATORY_CARE_PROVIDER_SITE_OTHER): Payer: Medicare Other | Admitting: Family Medicine

## 2014-11-21 VITALS — BP 90/52 | HR 64 | Temp 97.6°F | Resp 14 | Wt 113.0 lb

## 2014-11-21 DIAGNOSIS — R627 Adult failure to thrive: Secondary | ICD-10-CM | POA: Diagnosis not present

## 2014-11-21 DIAGNOSIS — R1310 Dysphagia, unspecified: Secondary | ICD-10-CM | POA: Diagnosis not present

## 2014-11-21 DIAGNOSIS — E43 Unspecified severe protein-calorie malnutrition: Secondary | ICD-10-CM | POA: Diagnosis not present

## 2014-11-21 LAB — CBC WITH DIFFERENTIAL/PLATELET
BASOS PCT: 0 % (ref 0–1)
Basophils Absolute: 0 10*3/uL (ref 0.0–0.1)
EOS ABS: 0.1 10*3/uL (ref 0.0–0.7)
Eosinophils Relative: 1 % (ref 0–5)
HCT: 30.2 % — ABNORMAL LOW (ref 39.0–52.0)
Hemoglobin: 10.1 g/dL — ABNORMAL LOW (ref 13.0–17.0)
Lymphocytes Relative: 20 % (ref 12–46)
Lymphs Abs: 1.8 10*3/uL (ref 0.7–4.0)
MCH: 31.5 pg (ref 26.0–34.0)
MCHC: 33.4 g/dL (ref 30.0–36.0)
MCV: 94.1 fL (ref 78.0–100.0)
MPV: 8.9 fL (ref 8.6–12.4)
Monocytes Absolute: 0.6 10*3/uL (ref 0.1–1.0)
Monocytes Relative: 7 % (ref 3–12)
NEUTROS PCT: 72 % (ref 43–77)
Neutro Abs: 6.4 10*3/uL (ref 1.7–7.7)
PLATELETS: 246 10*3/uL (ref 150–400)
RBC: 3.21 MIL/uL — ABNORMAL LOW (ref 4.22–5.81)
RDW: 14.4 % (ref 11.5–15.5)
WBC: 8.9 10*3/uL (ref 4.0–10.5)

## 2014-11-21 LAB — COMPLETE METABOLIC PANEL WITH GFR
ALT: 13 U/L (ref 0–53)
AST: 17 U/L (ref 0–37)
Albumin: 3 g/dL — ABNORMAL LOW (ref 3.5–5.2)
Alkaline Phosphatase: 54 U/L (ref 39–117)
BUN: 43 mg/dL — ABNORMAL HIGH (ref 6–23)
CALCIUM: 9 mg/dL (ref 8.4–10.5)
CHLORIDE: 105 meq/L (ref 96–112)
CO2: 17 meq/L — AB (ref 19–32)
Creat: 2.16 mg/dL — ABNORMAL HIGH (ref 0.50–1.35)
GFR, EST NON AFRICAN AMERICAN: 27 mL/min — AB
GFR, Est African American: 31 mL/min — ABNORMAL LOW
GLUCOSE: 106 mg/dL — AB (ref 70–99)
POTASSIUM: 4.9 meq/L (ref 3.5–5.3)
Sodium: 138 mEq/L (ref 135–145)
TOTAL PROTEIN: 6.6 g/dL (ref 6.0–8.3)
Total Bilirubin: 0.4 mg/dL (ref 0.2–1.2)

## 2014-11-21 LAB — TSH: TSH: 4.128 u[IU]/mL (ref 0.350–4.500)

## 2014-11-21 NOTE — Addendum Note (Signed)
Addended by: Lynnea FerrierPICKARD, Derek Huneycutt on: 11/21/2014 12:26 PM   Modules accepted: Orders

## 2014-11-21 NOTE — Progress Notes (Signed)
Subjective:    Patient ID: [name], male    DOB: May 26, 1926, 81 y.o.   MRN: [id number]  HPI  11/14/14 Patient is a frail 81 yo AAM who presents with worsening weakness, difficulty urinating.  For the last week, the patient has only been able to void small amounts.  He urinates often but he only dribbles. This is a sudden change over the last week. He also complains of constipation. He is very deconditioned. He is unable to stand without maximum assistance. I literally had to pick him up to put him on the exam table in order to perform a prostate exam. On exam his prostate is swollen +2. It is tender. There is no fecal impaction.  At that time, my plan was: Patient has urinary retention I suspect due to acute prostatitis. Continue finasteride. Start Rapaflo 8 mg by mouth daily. Add Cipro 500 mg by mouth twice a day for at least 2 weeks. Recheck here in one week for reassessment. Go to the emergency room if complete and total urinary retention occurs. Point, the patient is still making urine and therefore does not  Require a catheter. Hopefully we can manage this conservatively.  Take MiraLAX daily for constipation.  11/21/14 Patient is here today for follow-up. He is accompanied by his daughter. She is concerned because the patient is becoming progressively weaker. He has lost 10 pounds since when I saw him last in October. Patient states that he is unable to eat. He cannot swallow. He feels like food is sticking in his esophagus. He is also having intermittent diarrhea and constipation. Due to his inability to eat and his inability to swallow he is extremely weak. He can barely stand. He is confined to a wheelchair. Patient has a history of severe protein calorie malnutrition and this seems to be worsening Past Medical History  Diagnosis Date  . BPH (benign prostatic hyperplasia)   . GERD (gastroesophageal reflux disease)   . Hyperlipidemia   . Fracture of femoral neck, left 02/17/2013  .  Diastolic dysfunction   . Aspiration precautions    Past Surgical History  Procedure Laterality Date  . Colon surgery    . Percutaneous pinning Left 02/17/2013    Procedure: PERCUTANEOUS PINNING HIP;  Surgeon: Eulas PostJoshua P Landau, MD;  [facility];  Service: Orthopedics;  Laterality: Left;   Current Outpatient Prescriptions on File Prior to Visit  Medication Sig Dispense Refill  . carvedilol (COREG) 3.125 MG tablet TAKE 1 TABLET (3.125 MG TOTAL) BY MOUTH 2 (TWO) TIMES DAILY WITH A MEAL. 180 tablet 3  . ciprofloxacin (CIPRO) 500 MG tablet Take 1 tablet (500 mg total) by mouth 2 (two) times daily. 28 tablet 0  . diazepam (VALIUM) 5 MG tablet Take 5 mg by mouth every 12 (twelve) hours as needed for anxiety.    . feeding supplement, ENSURE COMPLETE, (ENSURE COMPLETE) LIQD Take 237 mLs by mouth 2 (two) times daily between meals. 60 Bottle 1  . finasteride (PROSCAR) 5 MG tablet Take 5 mg by mouth daily.  3  . food thickener (THICK IT) POWD Use as directed for all liquids for nectar thick consistency 850 g 4  . latanoprost (XALATAN) 0.005 % ophthalmic solution   6  . ramipril (ALTACE) 1.25 MG capsule TAKE 1 CAPSULE (1.25 MG TOTAL) BY MOUTH DAILY. 90 capsule 3  . tamsulosin (FLOMAX) 0.4 MG CAPS capsule Take 1 capsule (0.4 mg total) by mouth daily. 30 capsule 3   No current facility-administered  medications on file prior to visit.   No Known Allergies History   Social History  . Marital Status: Married    Spouse Name: N/A  . Number of Children: N/A  . Years of Education: N/A   Occupational History  . Not on file.   Social History Main Topics  . Smoking status: Never Smoker   . Smokeless tobacco: Not on file  . Alcohol Use: No  . Drug Use: No  . Sexual Activity: No   Other Topics Concern  . Not on file   Social History Narrative     Review of Systems  All other systems reviewed and are negative.      Objective:   Physical Exam  Cardiovascular: Normal rate, regular rhythm  and normal heart sounds.   Pulmonary/Chest: Effort normal and breath sounds normal. No respiratory distress. He has no wheezes. He has no rales.  Abdominal: Soft. Bowel sounds are normal.  Vitals reviewed.         Assessment & Plan:  Failure to thrive in adult - Plan: CBC with Differential/Platelet, COMPLETE METABOLIC PANEL WITH GFR, TSH daughter is concerned that he may be having a recurrence of his colon cancer. I believe we need to Take the patient's problems 1 at a time. The most pressing concern I have is his failure to thrive. He is losing weight. He is unable to tolerate by mouth. If we do not address this quickly, the other problems will be not of consequence because the patient will be admitted due to dehydration and kidney failure. We have to get the patient tolerating by mouth or receiving some type of nutrition. Therefore I recommended Ensure cans one by mouth 3 times a day. I will also consult GI for an endoscopy to evaluate for esophageal stricture. Depending upon the results of his endoscopy, we may need to obtain a CT scan of the abdomen and pelvis to evaluate for malignancy versus a colonoscopy. However given the patient's frailty I hesitate to put him through a colonoscopy. We will begin with an evaluation try to determine why the patient is experiencing dysphagia.  Patient's prostatitis seems to be improving. Continue the Flomax and complete the antibiotics. His blood pressure is getting very low and therefore I've asked the patient to discontinue all face. We may need to discontinue Coreg if his blood pressure does not improve.

## 2014-11-24 ENCOUNTER — Other Ambulatory Visit: Payer: Self-pay | Admitting: Family Medicine

## 2014-11-24 DIAGNOSIS — E860 Dehydration: Secondary | ICD-10-CM

## 2014-11-24 DIAGNOSIS — E46 Unspecified protein-calorie malnutrition: Secondary | ICD-10-CM

## 2014-11-27 ENCOUNTER — Other Ambulatory Visit: Payer: Medicare Other

## 2014-11-27 DIAGNOSIS — E46 Unspecified protein-calorie malnutrition: Secondary | ICD-10-CM | POA: Diagnosis not present

## 2014-11-27 DIAGNOSIS — E860 Dehydration: Secondary | ICD-10-CM | POA: Diagnosis not present

## 2014-11-27 LAB — BASIC METABOLIC PANEL
BUN: 43 mg/dL — AB (ref 6–23)
CALCIUM: 8.5 mg/dL (ref 8.4–10.5)
CHLORIDE: 103 meq/L (ref 96–112)
CO2: 24 mEq/L (ref 19–32)
Creat: 1.62 mg/dL — ABNORMAL HIGH (ref 0.50–1.35)
Glucose, Bld: 97 mg/dL (ref 70–99)
Potassium: 5 mEq/L (ref 3.5–5.3)
SODIUM: 136 meq/L (ref 135–145)

## 2014-11-28 ENCOUNTER — Telehealth: Payer: Self-pay | Admitting: Family Medicine

## 2014-11-28 DIAGNOSIS — R627 Adult failure to thrive: Secondary | ICD-10-CM | POA: Diagnosis not present

## 2014-11-28 DIAGNOSIS — Z9181 History of falling: Secondary | ICD-10-CM | POA: Diagnosis not present

## 2014-11-28 DIAGNOSIS — E785 Hyperlipidemia, unspecified: Secondary | ICD-10-CM | POA: Diagnosis not present

## 2014-11-28 DIAGNOSIS — D649 Anemia, unspecified: Secondary | ICD-10-CM | POA: Diagnosis not present

## 2014-11-28 DIAGNOSIS — I5032 Chronic diastolic (congestive) heart failure: Secondary | ICD-10-CM | POA: Diagnosis not present

## 2014-11-28 DIAGNOSIS — R1310 Dysphagia, unspecified: Secondary | ICD-10-CM | POA: Diagnosis not present

## 2014-11-28 DIAGNOSIS — N400 Enlarged prostate without lower urinary tract symptoms: Secondary | ICD-10-CM | POA: Diagnosis not present

## 2014-11-28 DIAGNOSIS — Z87440 Personal history of urinary (tract) infections: Secondary | ICD-10-CM | POA: Diagnosis not present

## 2014-11-28 DIAGNOSIS — R32 Unspecified urinary incontinence: Secondary | ICD-10-CM | POA: Diagnosis not present

## 2014-11-28 NOTE — Telephone Encounter (Signed)
George MortimerWanda aware and will stop Coreg and have home health recheck BP

## 2014-11-28 NOTE — Telephone Encounter (Signed)
[facility] [place] [facility] stating that his BP was 84/50 and his pulse was 50.  They will be doing home visit with the pt for medical and medication management - 2 x week for 7 weeks and 1 x week for 2 weeks.  Per Dr. Tanya NonesPickard pt needs to DC Coreg  Called and LMOVM with pt

## 2014-11-30 DIAGNOSIS — I5032 Chronic diastolic (congestive) heart failure: Secondary | ICD-10-CM | POA: Diagnosis not present

## 2014-11-30 DIAGNOSIS — R32 Unspecified urinary incontinence: Secondary | ICD-10-CM | POA: Diagnosis not present

## 2014-11-30 DIAGNOSIS — E785 Hyperlipidemia, unspecified: Secondary | ICD-10-CM | POA: Diagnosis not present

## 2014-11-30 DIAGNOSIS — R627 Adult failure to thrive: Secondary | ICD-10-CM | POA: Diagnosis not present

## 2014-11-30 DIAGNOSIS — Z9181 History of falling: Secondary | ICD-10-CM | POA: Diagnosis not present

## 2014-11-30 DIAGNOSIS — Z87440 Personal history of urinary (tract) infections: Secondary | ICD-10-CM | POA: Diagnosis not present

## 2014-11-30 DIAGNOSIS — R1310 Dysphagia, unspecified: Secondary | ICD-10-CM | POA: Diagnosis not present

## 2014-11-30 DIAGNOSIS — N400 Enlarged prostate without lower urinary tract symptoms: Secondary | ICD-10-CM | POA: Diagnosis not present

## 2014-11-30 DIAGNOSIS — D649 Anemia, unspecified: Secondary | ICD-10-CM | POA: Diagnosis not present

## 2014-12-01 ENCOUNTER — Encounter: Payer: Self-pay | Admitting: Physician Assistant

## 2014-12-01 ENCOUNTER — Ambulatory Visit (INDEPENDENT_AMBULATORY_CARE_PROVIDER_SITE_OTHER): Payer: Medicare Other | Admitting: Physician Assistant

## 2014-12-01 VITALS — BP 110/54 | HR 58 | Ht 67.0 in | Wt 112.0 lb

## 2014-12-01 DIAGNOSIS — R1314 Dysphagia, pharyngoesophageal phase: Secondary | ICD-10-CM

## 2014-12-01 DIAGNOSIS — R634 Abnormal weight loss: Secondary | ICD-10-CM

## 2014-12-01 NOTE — Progress Notes (Signed)
Patient ID: George SchaumannGordon Q Alwine, male   DOB: 22-Jul-1925, 81 y.o.   MRN: [id number]   Subjective:    Patient ID: [name], male    DOB: 22-Jul-1925, 81 y.o.   MRN: [id number]  HPI Roger ShelterGordon is an 81 year old African-American male referred today by Dr. Lynnea FerrierWarren Pickard for evaluation of dysphagia. Patient has had some general failure to thrive over the past several months and weight loss of about 13 pounds according to his family since October. He had a hospitalization in September 2015 with congestive heart failure, had generalized weakness and deconditioning at that time and had undergone speech path evaluation for dysphagia then with finding of a severe oral phase dysphagia and pharyngeal phase dysphagia with lingual pumping week and delayed propulsion and some evidence of silent aspiration. At that time family decided to proceed with oral feedings. The come in today stating that he is been having difficulty swallowing over the past 1 month. He has a long term speech impediment and is difficult to understand also had all of his teeth removed several years ago. He says he is some difficulty with liquids but most of the time has trouble with solid food feeling like it sitting in his chest. He apparently has been having some regurgitation episodes and some coughing. Complains of some gas buildup in his chest which his family says may be heartburn. He has no complaints of abdominal pain. He has been avoiding solid food and primarily has been having full liquid consistencies in addition to 3 Enures  per day. Family relates remote history of colon cancer 15-20 years ago. Apparently he has had [facility] but they are not sure. Other medical problems include chronic congestive heart failure, BPH, hyperlipidemia, inflammatory arthriti,  Review of Systems Pertinent positive and negative review of systems were noted in the above HPI section.  All other review of systems was otherwise  negative.  Outpatient Encounter Prescriptions as of 12/01/2014  Medication Sig  . diazepam (VALIUM) 5 MG tablet Take 5 mg by mouth every 12 (twelve) hours as needed for anxiety.  . feeding supplement, ENSURE COMPLETE, (ENSURE COMPLETE) LIQD Take 237 mLs by mouth 2 (two) times daily between meals.  . finasteride (PROSCAR) 5 MG tablet Take 5 mg by mouth daily.  Marland Kitchen. latanoprost (XALATAN) 0.005 % ophthalmic solution   . ramipril (ALTACE) 1.25 MG capsule TAKE 1 CAPSULE (1.25 MG TOTAL) BY MOUTH DAILY.  . tamsulosin (FLOMAX) 0.4 MG CAPS capsule Take 1 capsule (0.4 mg total) by mouth daily.  . [DISCONTINUED] carvedilol (COREG) 3.125 MG tablet TAKE 1 TABLET (3.125 MG TOTAL) BY MOUTH 2 (TWO) TIMES DAILY WITH A MEAL. (Patient not taking: Reported on 12/01/2014)  . [DISCONTINUED] ciprofloxacin (CIPRO) 500 MG tablet Take 1 tablet (500 mg total) by mouth 2 (two) times daily.  . [DISCONTINUED] food thickener (THICK IT) POWD Use as directed for all liquids for nectar thick consistency   No facility-administered encounter medications on file as of 12/01/2014.   No Known Allergies Patient Active Problem List   Diagnosis Date Noted  . Diastolic dysfunction   . Aspiration precautions   . Hypernatremia 03/13/2014  . Bacteremia due to Gram-positive bacteria 03/12/2014  . Dysphagia 03/10/2014  . Acute on chronic diastolic heart failure 03/10/2014  . Protein-calorie malnutrition, severe 03/09/2014  . Chronic diastolic CHF (congestive heart failure) 03/09/2014  . Inflammatory arthropathy 03/09/2014  . Anemia 03/08/2014  . UTI (lower urinary tract infection) 03/08/2014  . Hypokalemia 03/08/2014  . Sepsis  03/08/2014  . Cellulitis of right hand 02/20/2014  . Chronic ulcer of toe of right foot with fat layer exposed 02/20/2014  . Fracture of femoral neck, left 02/17/2013  . BPH (benign prostatic hyperplasia)   . GERD (gastroesophageal reflux disease)   . Hyperlipidemia    History   Social History  . Marital  Status: Married    Spouse Name: N/A  . Number of Children: N/A  . Years of Education: N/A   Occupational History  . Retired    Social History Main Topics  . Smoking status: Never Smoker   . Smokeless tobacco: Not on file  . Alcohol Use: No  . Drug Use: No  . Sexual Activity: No   Other Topics Concern  . Not on file   Social History Narrative    Mr. Frampton family history includes Hypertension in his sister; Migraines in his sister.      Objective:    Filed Vitals:   12/01/14 0911  BP: 110/54  Pulse: 58    Physical Exam  well-developed elderly African-American male in a wheelchair accompanied by 2 daughters blood pressure 110/54 pulse 58 height 5 foot 7 weight 112. HEENT; nontraumatic normocephalic EOMI PERRLA patient is edentulous no palpable cervical adenopathy, Cardiovascular; regular rate and rhythm with S1-S2 no murmur rub or gallop, Pulmonary; clear bilaterally, Abdomen soft nontender nondistended bowel sounds are present no palpable mass or hepatosplenomegaly, Extremities; thin no clubbing cyanosis or edema skin warm and dry, Psych ;mood and affect appropriate though patient difficult to understand due to speech impediment.       Assessment & Plan:   #1 81 yo male  with complaint of one month history of dysphagia primarily solids but some liquid component, and episodes of regurgitation. Patient had a swallowing eval done several months ago while hospitalized and was felt to have an oral and pharyngeal phase dysphagia with silent aspiration. This may be multifactorial at this time, but will need to rule out esophageal motility disorder versus stricture or lesion. #2 weight loss secondary to above #3 CHF #4 BPH #5 inflammatory arthropathy #6 remote history of colon cancer  Plan; Will proceed with barium swallow with tablet initially then depending on results patient may need speech path eval versus EGD with Dr. Christella Hartigan. Both were discussed with patient and his  family. For now patient encouraged to continue full liquid diet and to increase Ensure to 4 daily.   Jovana Rembold S [place]ceola Depaz PA-C 12/01/2014   Cc: Donita Brooks, MD

## 2014-12-01 NOTE — Patient Instructions (Addendum)
Full liquid diet. Ensure 4 daily. .You have been scheduled for a Barium Esophogram at [facility] (1st floor of the hospital) on  Wed 12-06-2014 at 10:30 am . Please arrive at 10:15 am prior to your appointment for registration. Make certain not to have anything to eat or drink 3 hours prior to your test. If you need to reschedule for any reason, please contact radiology at [phone] to do so. __________________________________________________________________ A barium swallow is an examination that concentrates on views of the esophagus. This tends to be a double contrast exam (barium and two liquids which, when combined, create a gas to distend the wall of the oesophagus) or single contrast (non-ionic iodine based). The study is usually tailored to your symptoms so a good history is essential. Attention is paid during the study to the form, structure and configuration of the esophagus, looking for functional disorders (such as aspiration, dysphagia, achalasia, motility and reflux) EXAMINATION You may be asked to change into a gown, depending on the type of swallow being performed. A radiologist and radiographer will perform the procedure. The radiologist will advise you of the type of contrast selected for your procedure and direct you during the exam. You will be asked to stand, sit or lie in several different positions and to hold a small amount of fluid in your mouth before being asked to swallow while the imaging is performed .In some instances you may be asked to swallow barium coated marshmallows to assess the motility of a solid food bolus. The exam can be recorded as a digital or video fluoroscopy procedure. POST PROCEDURE It will take 1-2 days for the barium to pass through your system. To facilitate this, it is important, unless otherwise directed, to increase your fluids for the next 24-48hrs and to resume your normal diet.  This test typically takes about 30 minutes to  perform. __________________________________________________________________________________

## 2014-12-05 ENCOUNTER — Telehealth: Payer: Self-pay | Admitting: Family Medicine

## 2014-12-05 DIAGNOSIS — I5032 Chronic diastolic (congestive) heart failure: Secondary | ICD-10-CM | POA: Diagnosis not present

## 2014-12-05 DIAGNOSIS — R32 Unspecified urinary incontinence: Secondary | ICD-10-CM | POA: Diagnosis not present

## 2014-12-05 DIAGNOSIS — N400 Enlarged prostate without lower urinary tract symptoms: Secondary | ICD-10-CM | POA: Diagnosis not present

## 2014-12-05 DIAGNOSIS — R627 Adult failure to thrive: Secondary | ICD-10-CM | POA: Diagnosis not present

## 2014-12-05 DIAGNOSIS — Z87440 Personal history of urinary (tract) infections: Secondary | ICD-10-CM | POA: Diagnosis not present

## 2014-12-05 DIAGNOSIS — Z9181 History of falling: Secondary | ICD-10-CM | POA: Diagnosis not present

## 2014-12-05 DIAGNOSIS — E785 Hyperlipidemia, unspecified: Secondary | ICD-10-CM | POA: Diagnosis not present

## 2014-12-05 DIAGNOSIS — R1310 Dysphagia, unspecified: Secondary | ICD-10-CM | POA: Diagnosis not present

## 2014-12-05 DIAGNOSIS — D649 Anemia, unspecified: Secondary | ICD-10-CM | POA: Diagnosis not present

## 2014-12-05 NOTE — Telephone Encounter (Signed)
LMOVM giving permission for home heath svs

## 2014-12-05 NOTE — Telephone Encounter (Signed)
[name] from [place] calling about getting authorization for home health aid  Please call her at [phone]

## 2014-12-05 NOTE — Progress Notes (Signed)
i agree with the above note, plan 

## 2014-12-06 ENCOUNTER — Ambulatory Visit (HOSPITAL_COMMUNITY)
Admission: RE | Admit: 2014-12-06 | Discharge: 2014-12-06 | Disposition: A | Discharge status: Home / Self Care | Payer: Medicare Other | Source: Ambulatory Visit | Attending: Physician Assistant | Admitting: Physician Assistant

## 2014-12-06 DIAGNOSIS — R110 Nausea: Secondary | ICD-10-CM | POA: Diagnosis not present

## 2014-12-06 DIAGNOSIS — R634 Abnormal weight loss: Secondary | ICD-10-CM

## 2014-12-06 DIAGNOSIS — R1310 Dysphagia, unspecified: Secondary | ICD-10-CM | POA: Insufficient documentation

## 2014-12-06 DIAGNOSIS — R1314 Dysphagia, pharyngoesophageal phase: Secondary | ICD-10-CM

## 2014-12-06 DIAGNOSIS — R05 Cough: Secondary | ICD-10-CM | POA: Diagnosis not present

## 2014-12-07 DIAGNOSIS — Z87440 Personal history of urinary (tract) infections: Secondary | ICD-10-CM | POA: Diagnosis not present

## 2014-12-07 DIAGNOSIS — Z9181 History of falling: Secondary | ICD-10-CM | POA: Diagnosis not present

## 2014-12-07 DIAGNOSIS — E785 Hyperlipidemia, unspecified: Secondary | ICD-10-CM | POA: Diagnosis not present

## 2014-12-07 DIAGNOSIS — R32 Unspecified urinary incontinence: Secondary | ICD-10-CM | POA: Diagnosis not present

## 2014-12-07 DIAGNOSIS — D649 Anemia, unspecified: Secondary | ICD-10-CM | POA: Diagnosis not present

## 2014-12-07 DIAGNOSIS — N400 Enlarged prostate without lower urinary tract symptoms: Secondary | ICD-10-CM | POA: Diagnosis not present

## 2014-12-07 DIAGNOSIS — R1310 Dysphagia, unspecified: Secondary | ICD-10-CM | POA: Diagnosis not present

## 2014-12-07 DIAGNOSIS — I5032 Chronic diastolic (congestive) heart failure: Secondary | ICD-10-CM | POA: Diagnosis not present

## 2014-12-07 DIAGNOSIS — R627 Adult failure to thrive: Secondary | ICD-10-CM | POA: Diagnosis not present

## 2014-12-08 ENCOUNTER — Other Ambulatory Visit: Payer: Self-pay

## 2014-12-08 ENCOUNTER — Other Ambulatory Visit (HOSPITAL_COMMUNITY): Payer: Self-pay | Admitting: Physician Assistant

## 2014-12-08 DIAGNOSIS — Z9181 History of falling: Secondary | ICD-10-CM | POA: Diagnosis not present

## 2014-12-08 DIAGNOSIS — Z87440 Personal history of urinary (tract) infections: Secondary | ICD-10-CM | POA: Diagnosis not present

## 2014-12-08 DIAGNOSIS — R634 Abnormal weight loss: Secondary | ICD-10-CM

## 2014-12-08 DIAGNOSIS — R1310 Dysphagia, unspecified: Secondary | ICD-10-CM | POA: Diagnosis not present

## 2014-12-08 DIAGNOSIS — D649 Anemia, unspecified: Secondary | ICD-10-CM | POA: Diagnosis not present

## 2014-12-08 DIAGNOSIS — I5032 Chronic diastolic (congestive) heart failure: Secondary | ICD-10-CM | POA: Diagnosis not present

## 2014-12-08 DIAGNOSIS — E785 Hyperlipidemia, unspecified: Secondary | ICD-10-CM | POA: Diagnosis not present

## 2014-12-08 DIAGNOSIS — R32 Unspecified urinary incontinence: Secondary | ICD-10-CM | POA: Diagnosis not present

## 2014-12-08 DIAGNOSIS — R1314 Dysphagia, pharyngoesophageal phase: Secondary | ICD-10-CM

## 2014-12-08 DIAGNOSIS — R627 Adult failure to thrive: Secondary | ICD-10-CM | POA: Diagnosis not present

## 2014-12-08 DIAGNOSIS — T17308S Unspecified foreign body in larynx causing other injury, sequela: Secondary | ICD-10-CM

## 2014-12-08 DIAGNOSIS — N400 Enlarged prostate without lower urinary tract symptoms: Secondary | ICD-10-CM | POA: Diagnosis not present

## 2014-12-08 NOTE — Telephone Encounter (Signed)
Called and spoke with Marylene LandAngela and gave her permission to [facility]

## 2014-12-11 DIAGNOSIS — E785 Hyperlipidemia, unspecified: Secondary | ICD-10-CM | POA: Diagnosis not present

## 2014-12-11 DIAGNOSIS — Z87440 Personal history of urinary (tract) infections: Secondary | ICD-10-CM | POA: Diagnosis not present

## 2014-12-11 DIAGNOSIS — D649 Anemia, unspecified: Secondary | ICD-10-CM | POA: Diagnosis not present

## 2014-12-11 DIAGNOSIS — I5032 Chronic diastolic (congestive) heart failure: Secondary | ICD-10-CM | POA: Diagnosis not present

## 2014-12-11 DIAGNOSIS — Z9181 History of falling: Secondary | ICD-10-CM | POA: Diagnosis not present

## 2014-12-11 DIAGNOSIS — R32 Unspecified urinary incontinence: Secondary | ICD-10-CM | POA: Diagnosis not present

## 2014-12-11 DIAGNOSIS — N400 Enlarged prostate without lower urinary tract symptoms: Secondary | ICD-10-CM | POA: Diagnosis not present

## 2014-12-11 DIAGNOSIS — R1310 Dysphagia, unspecified: Secondary | ICD-10-CM | POA: Diagnosis not present

## 2014-12-11 DIAGNOSIS — R627 Adult failure to thrive: Secondary | ICD-10-CM | POA: Diagnosis not present

## 2014-12-12 ENCOUNTER — Telehealth: Payer: Self-pay | Admitting: Physician Assistant

## 2014-12-12 NOTE — Telephone Encounter (Signed)
I have left message for the patient to call back 

## 2014-12-12 NOTE — Telephone Encounter (Signed)
Spoke with Ms [name]. She and the family have decided not to pursue the modified barium swallow at this point. They are certain the patient will not consent to a feeding tube. She will let me know what they are willing to do at this point.

## 2014-12-12 NOTE — Telephone Encounter (Signed)
ok 

## 2014-12-13 DIAGNOSIS — R627 Adult failure to thrive: Secondary | ICD-10-CM | POA: Diagnosis not present

## 2014-12-13 DIAGNOSIS — I5032 Chronic diastolic (congestive) heart failure: Secondary | ICD-10-CM | POA: Diagnosis not present

## 2014-12-13 DIAGNOSIS — Z87440 Personal history of urinary (tract) infections: Secondary | ICD-10-CM | POA: Diagnosis not present

## 2014-12-13 DIAGNOSIS — N400 Enlarged prostate without lower urinary tract symptoms: Secondary | ICD-10-CM | POA: Diagnosis not present

## 2014-12-13 DIAGNOSIS — R1310 Dysphagia, unspecified: Secondary | ICD-10-CM | POA: Diagnosis not present

## 2014-12-13 DIAGNOSIS — E785 Hyperlipidemia, unspecified: Secondary | ICD-10-CM | POA: Diagnosis not present

## 2014-12-13 DIAGNOSIS — D649 Anemia, unspecified: Secondary | ICD-10-CM | POA: Diagnosis not present

## 2014-12-13 DIAGNOSIS — Z9181 History of falling: Secondary | ICD-10-CM | POA: Diagnosis not present

## 2014-12-13 DIAGNOSIS — R32 Unspecified urinary incontinence: Secondary | ICD-10-CM | POA: Diagnosis not present

## 2014-12-14 DIAGNOSIS — D649 Anemia, unspecified: Secondary | ICD-10-CM | POA: Diagnosis not present

## 2014-12-14 DIAGNOSIS — Z9181 History of falling: Secondary | ICD-10-CM | POA: Diagnosis not present

## 2014-12-14 DIAGNOSIS — E785 Hyperlipidemia, unspecified: Secondary | ICD-10-CM | POA: Diagnosis not present

## 2014-12-14 DIAGNOSIS — R627 Adult failure to thrive: Secondary | ICD-10-CM | POA: Diagnosis not present

## 2014-12-14 DIAGNOSIS — R32 Unspecified urinary incontinence: Secondary | ICD-10-CM | POA: Diagnosis not present

## 2014-12-14 DIAGNOSIS — R1310 Dysphagia, unspecified: Secondary | ICD-10-CM | POA: Diagnosis not present

## 2014-12-14 DIAGNOSIS — Z87440 Personal history of urinary (tract) infections: Secondary | ICD-10-CM | POA: Diagnosis not present

## 2014-12-14 DIAGNOSIS — I5032 Chronic diastolic (congestive) heart failure: Secondary | ICD-10-CM | POA: Diagnosis not present

## 2014-12-14 DIAGNOSIS — N400 Enlarged prostate without lower urinary tract symptoms: Secondary | ICD-10-CM | POA: Diagnosis not present

## 2014-12-15 DIAGNOSIS — N400 Enlarged prostate without lower urinary tract symptoms: Secondary | ICD-10-CM | POA: Diagnosis not present

## 2014-12-15 DIAGNOSIS — Z87440 Personal history of urinary (tract) infections: Secondary | ICD-10-CM | POA: Diagnosis not present

## 2014-12-15 DIAGNOSIS — D649 Anemia, unspecified: Secondary | ICD-10-CM | POA: Diagnosis not present

## 2014-12-15 DIAGNOSIS — Z9181 History of falling: Secondary | ICD-10-CM | POA: Diagnosis not present

## 2014-12-15 DIAGNOSIS — R32 Unspecified urinary incontinence: Secondary | ICD-10-CM | POA: Diagnosis not present

## 2014-12-15 DIAGNOSIS — E785 Hyperlipidemia, unspecified: Secondary | ICD-10-CM | POA: Diagnosis not present

## 2014-12-15 DIAGNOSIS — R627 Adult failure to thrive: Secondary | ICD-10-CM | POA: Diagnosis not present

## 2014-12-15 DIAGNOSIS — R1310 Dysphagia, unspecified: Secondary | ICD-10-CM | POA: Diagnosis not present

## 2014-12-15 DIAGNOSIS — I5032 Chronic diastolic (congestive) heart failure: Secondary | ICD-10-CM | POA: Diagnosis not present

## 2014-12-18 ENCOUNTER — Ambulatory Visit: Payer: Medicare Other | Admitting: Podiatry

## 2014-12-20 ENCOUNTER — Ambulatory Visit (HOSPITAL_COMMUNITY): Payer: Medicare Other

## 2014-12-20 ENCOUNTER — Telehealth: Payer: Self-pay | Admitting: Family Medicine

## 2014-12-20 DIAGNOSIS — R1310 Dysphagia, unspecified: Secondary | ICD-10-CM | POA: Diagnosis not present

## 2014-12-20 DIAGNOSIS — D649 Anemia, unspecified: Secondary | ICD-10-CM | POA: Diagnosis not present

## 2014-12-20 DIAGNOSIS — N400 Enlarged prostate without lower urinary tract symptoms: Secondary | ICD-10-CM | POA: Diagnosis not present

## 2014-12-20 DIAGNOSIS — E785 Hyperlipidemia, unspecified: Secondary | ICD-10-CM | POA: Diagnosis not present

## 2014-12-20 DIAGNOSIS — Z87440 Personal history of urinary (tract) infections: Secondary | ICD-10-CM | POA: Diagnosis not present

## 2014-12-20 DIAGNOSIS — R627 Adult failure to thrive: Secondary | ICD-10-CM | POA: Diagnosis not present

## 2014-12-20 DIAGNOSIS — Z9181 History of falling: Secondary | ICD-10-CM | POA: Diagnosis not present

## 2014-12-20 DIAGNOSIS — R32 Unspecified urinary incontinence: Secondary | ICD-10-CM | POA: Diagnosis not present

## 2014-12-20 DIAGNOSIS — I5032 Chronic diastolic (congestive) heart failure: Secondary | ICD-10-CM | POA: Diagnosis not present

## 2014-12-20 NOTE — Telephone Encounter (Signed)
Agreed, may also need diuresis for edema.  Await CXR results

## 2014-12-20 NOTE — Telephone Encounter (Signed)
Patient calling to get rx for ensure if possible, says it is very expensive without an rx  [phone]

## 2014-12-20 NOTE — Telephone Encounter (Signed)
Tim from [place] called about this pt and states that he did a check on him as the pt was c/o not feeling well - He is having some cough and base of lungs he could hear some crackles. He has some increased swelling in his legs but no other symptoms reported by pt. He is concerned about fluid overload however his BP was 115/52, temp. 98.6 and o2sat was 86-87% but did go up to 97% with concentrated breathing exercises. Home health nurse wanted to get a chest xray to r/o PNA so I give the order to have that done and they will fax [place] the report once it has been completed.

## 2014-12-21 DIAGNOSIS — N400 Enlarged prostate without lower urinary tract symptoms: Secondary | ICD-10-CM | POA: Diagnosis not present

## 2014-12-21 DIAGNOSIS — E785 Hyperlipidemia, unspecified: Secondary | ICD-10-CM | POA: Diagnosis not present

## 2014-12-21 DIAGNOSIS — R0989 Other specified symptoms and signs involving the circulatory and respiratory systems: Secondary | ICD-10-CM | POA: Diagnosis not present

## 2014-12-21 DIAGNOSIS — D649 Anemia, unspecified: Secondary | ICD-10-CM | POA: Diagnosis not present

## 2014-12-21 DIAGNOSIS — Z9181 History of falling: Secondary | ICD-10-CM | POA: Diagnosis not present

## 2014-12-21 DIAGNOSIS — R627 Adult failure to thrive: Secondary | ICD-10-CM | POA: Diagnosis not present

## 2014-12-21 DIAGNOSIS — Z87440 Personal history of urinary (tract) infections: Secondary | ICD-10-CM | POA: Diagnosis not present

## 2014-12-21 DIAGNOSIS — R32 Unspecified urinary incontinence: Secondary | ICD-10-CM | POA: Diagnosis not present

## 2014-12-21 DIAGNOSIS — I5032 Chronic diastolic (congestive) heart failure: Secondary | ICD-10-CM | POA: Diagnosis not present

## 2014-12-21 DIAGNOSIS — R1310 Dysphagia, unspecified: Secondary | ICD-10-CM | POA: Diagnosis not present

## 2014-12-21 NOTE — Telephone Encounter (Signed)
RX done and Burna Mortimer aware that she will need to take to [place] and they may be able to help - [facility] can fill that type of RX. RX wrote, signed and left up front for pick up.

## 2014-12-22 NOTE — Telephone Encounter (Signed)
CXR clear if worse ntbs

## 2014-12-22 NOTE — Telephone Encounter (Signed)
Burna Mortimer aware of results

## 2014-12-23 DIAGNOSIS — N400 Enlarged prostate without lower urinary tract symptoms: Secondary | ICD-10-CM | POA: Diagnosis not present

## 2014-12-23 DIAGNOSIS — R627 Adult failure to thrive: Secondary | ICD-10-CM | POA: Diagnosis not present

## 2014-12-23 DIAGNOSIS — R32 Unspecified urinary incontinence: Secondary | ICD-10-CM | POA: Diagnosis not present

## 2014-12-23 DIAGNOSIS — E785 Hyperlipidemia, unspecified: Secondary | ICD-10-CM | POA: Diagnosis not present

## 2014-12-23 DIAGNOSIS — R1310 Dysphagia, unspecified: Secondary | ICD-10-CM | POA: Diagnosis not present

## 2014-12-23 DIAGNOSIS — I5032 Chronic diastolic (congestive) heart failure: Secondary | ICD-10-CM | POA: Diagnosis not present

## 2014-12-23 DIAGNOSIS — Z87440 Personal history of urinary (tract) infections: Secondary | ICD-10-CM | POA: Diagnosis not present

## 2014-12-23 DIAGNOSIS — Z9181 History of falling: Secondary | ICD-10-CM | POA: Diagnosis not present

## 2014-12-23 DIAGNOSIS — D649 Anemia, unspecified: Secondary | ICD-10-CM | POA: Diagnosis not present

## 2014-12-25 DIAGNOSIS — I5032 Chronic diastolic (congestive) heart failure: Secondary | ICD-10-CM | POA: Diagnosis not present

## 2014-12-25 DIAGNOSIS — E785 Hyperlipidemia, unspecified: Secondary | ICD-10-CM | POA: Diagnosis not present

## 2014-12-25 DIAGNOSIS — R1310 Dysphagia, unspecified: Secondary | ICD-10-CM | POA: Diagnosis not present

## 2014-12-25 DIAGNOSIS — D649 Anemia, unspecified: Secondary | ICD-10-CM | POA: Diagnosis not present

## 2014-12-25 DIAGNOSIS — N400 Enlarged prostate without lower urinary tract symptoms: Secondary | ICD-10-CM | POA: Diagnosis not present

## 2014-12-25 DIAGNOSIS — R32 Unspecified urinary incontinence: Secondary | ICD-10-CM | POA: Diagnosis not present

## 2014-12-25 DIAGNOSIS — R627 Adult failure to thrive: Secondary | ICD-10-CM | POA: Diagnosis not present

## 2014-12-25 DIAGNOSIS — Z87440 Personal history of urinary (tract) infections: Secondary | ICD-10-CM | POA: Diagnosis not present

## 2014-12-25 DIAGNOSIS — Z9181 History of falling: Secondary | ICD-10-CM | POA: Diagnosis not present

## 2014-12-26 DIAGNOSIS — H2513 Age-related nuclear cataract, bilateral: Secondary | ICD-10-CM | POA: Diagnosis not present

## 2014-12-26 DIAGNOSIS — H4011X4 Primary open-angle glaucoma, indeterminate stage: Secondary | ICD-10-CM | POA: Diagnosis not present

## 2014-12-27 ENCOUNTER — Encounter: Payer: Self-pay | Admitting: Podiatry

## 2014-12-27 ENCOUNTER — Ambulatory Visit (INDEPENDENT_AMBULATORY_CARE_PROVIDER_SITE_OTHER): Payer: Medicare Other | Admitting: Podiatry

## 2014-12-27 DIAGNOSIS — N400 Enlarged prostate without lower urinary tract symptoms: Secondary | ICD-10-CM | POA: Diagnosis not present

## 2014-12-27 DIAGNOSIS — M79676 Pain in unspecified toe(s): Secondary | ICD-10-CM | POA: Diagnosis not present

## 2014-12-27 DIAGNOSIS — I5032 Chronic diastolic (congestive) heart failure: Secondary | ICD-10-CM | POA: Diagnosis not present

## 2014-12-27 DIAGNOSIS — D649 Anemia, unspecified: Secondary | ICD-10-CM | POA: Diagnosis not present

## 2014-12-27 DIAGNOSIS — R1310 Dysphagia, unspecified: Secondary | ICD-10-CM | POA: Diagnosis not present

## 2014-12-27 DIAGNOSIS — Z87440 Personal history of urinary (tract) infections: Secondary | ICD-10-CM | POA: Diagnosis not present

## 2014-12-27 DIAGNOSIS — R627 Adult failure to thrive: Secondary | ICD-10-CM | POA: Diagnosis not present

## 2014-12-27 DIAGNOSIS — R32 Unspecified urinary incontinence: Secondary | ICD-10-CM | POA: Diagnosis not present

## 2014-12-27 DIAGNOSIS — B351 Tinea unguium: Secondary | ICD-10-CM

## 2014-12-27 DIAGNOSIS — Z9181 History of falling: Secondary | ICD-10-CM | POA: Diagnosis not present

## 2014-12-27 DIAGNOSIS — E785 Hyperlipidemia, unspecified: Secondary | ICD-10-CM | POA: Diagnosis not present

## 2014-12-27 DIAGNOSIS — Q828 Other specified congenital malformations of skin: Secondary | ICD-10-CM

## 2014-12-27 NOTE — Patient Instructions (Signed)
Removed bandage on second right toe 2 days Apply topical anti-biotic ointment to the second right toe daily and cover with a small gauze until healed

## 2014-12-28 DIAGNOSIS — Z9181 History of falling: Secondary | ICD-10-CM | POA: Diagnosis not present

## 2014-12-28 DIAGNOSIS — I5032 Chronic diastolic (congestive) heart failure: Secondary | ICD-10-CM | POA: Diagnosis not present

## 2014-12-28 DIAGNOSIS — R1310 Dysphagia, unspecified: Secondary | ICD-10-CM | POA: Diagnosis not present

## 2014-12-28 DIAGNOSIS — R32 Unspecified urinary incontinence: Secondary | ICD-10-CM | POA: Diagnosis not present

## 2014-12-28 DIAGNOSIS — E785 Hyperlipidemia, unspecified: Secondary | ICD-10-CM | POA: Diagnosis not present

## 2014-12-28 DIAGNOSIS — R627 Adult failure to thrive: Secondary | ICD-10-CM | POA: Diagnosis not present

## 2014-12-28 DIAGNOSIS — D649 Anemia, unspecified: Secondary | ICD-10-CM | POA: Diagnosis not present

## 2014-12-28 DIAGNOSIS — N400 Enlarged prostate without lower urinary tract symptoms: Secondary | ICD-10-CM | POA: Diagnosis not present

## 2014-12-28 DIAGNOSIS — Z87440 Personal history of urinary (tract) infections: Secondary | ICD-10-CM | POA: Diagnosis not present

## 2014-12-28 NOTE — Progress Notes (Signed)
Patient ID: [name], male   DOB: Jan 24, 1926, 81 y.o.   MRN: [id number] jective:   Subjective: This patient presents again for a scheduled visit complaining of painful nails and keratoses  Objective: Peripheral edema bilaterally Nucleated keratoses plantar 2 right 1 left Keratoses second right toe The toenails are hypertrophic, elongated, discolored and tender to direct palpation 6-10  Assessment: Symptomatic mycotic toenails 10  Porokeratosis 3 Keratoses 1  Plan: Debridement of toenails 10 and keratoses 4 without any bleeding  Reappoint 3 months

## 2014-12-29 DIAGNOSIS — N400 Enlarged prostate without lower urinary tract symptoms: Secondary | ICD-10-CM | POA: Diagnosis not present

## 2014-12-29 DIAGNOSIS — I5032 Chronic diastolic (congestive) heart failure: Secondary | ICD-10-CM | POA: Diagnosis not present

## 2014-12-29 DIAGNOSIS — R1310 Dysphagia, unspecified: Secondary | ICD-10-CM | POA: Diagnosis not present

## 2014-12-29 DIAGNOSIS — R627 Adult failure to thrive: Secondary | ICD-10-CM | POA: Diagnosis not present

## 2014-12-29 DIAGNOSIS — R32 Unspecified urinary incontinence: Secondary | ICD-10-CM | POA: Diagnosis not present

## 2014-12-29 DIAGNOSIS — Z87440 Personal history of urinary (tract) infections: Secondary | ICD-10-CM | POA: Diagnosis not present

## 2014-12-29 DIAGNOSIS — Z9181 History of falling: Secondary | ICD-10-CM | POA: Diagnosis not present

## 2014-12-29 DIAGNOSIS — D649 Anemia, unspecified: Secondary | ICD-10-CM | POA: Diagnosis not present

## 2014-12-29 DIAGNOSIS — E785 Hyperlipidemia, unspecified: Secondary | ICD-10-CM | POA: Diagnosis not present

## 2015-01-01 DIAGNOSIS — E785 Hyperlipidemia, unspecified: Secondary | ICD-10-CM | POA: Diagnosis not present

## 2015-01-01 DIAGNOSIS — Z9181 History of falling: Secondary | ICD-10-CM | POA: Diagnosis not present

## 2015-01-01 DIAGNOSIS — R627 Adult failure to thrive: Secondary | ICD-10-CM | POA: Diagnosis not present

## 2015-01-01 DIAGNOSIS — D649 Anemia, unspecified: Secondary | ICD-10-CM | POA: Diagnosis not present

## 2015-01-01 DIAGNOSIS — N400 Enlarged prostate without lower urinary tract symptoms: Secondary | ICD-10-CM | POA: Diagnosis not present

## 2015-01-01 DIAGNOSIS — I5032 Chronic diastolic (congestive) heart failure: Secondary | ICD-10-CM | POA: Diagnosis not present

## 2015-01-01 DIAGNOSIS — R1310 Dysphagia, unspecified: Secondary | ICD-10-CM | POA: Diagnosis not present

## 2015-01-01 DIAGNOSIS — R32 Unspecified urinary incontinence: Secondary | ICD-10-CM | POA: Diagnosis not present

## 2015-01-01 DIAGNOSIS — Z87440 Personal history of urinary (tract) infections: Secondary | ICD-10-CM | POA: Diagnosis not present

## 2015-01-02 DIAGNOSIS — D649 Anemia, unspecified: Secondary | ICD-10-CM | POA: Diagnosis not present

## 2015-01-02 DIAGNOSIS — Z9181 History of falling: Secondary | ICD-10-CM | POA: Diagnosis not present

## 2015-01-02 DIAGNOSIS — R1310 Dysphagia, unspecified: Secondary | ICD-10-CM | POA: Diagnosis not present

## 2015-01-02 DIAGNOSIS — R627 Adult failure to thrive: Secondary | ICD-10-CM | POA: Diagnosis not present

## 2015-01-02 DIAGNOSIS — E785 Hyperlipidemia, unspecified: Secondary | ICD-10-CM | POA: Diagnosis not present

## 2015-01-02 DIAGNOSIS — Z87440 Personal history of urinary (tract) infections: Secondary | ICD-10-CM | POA: Diagnosis not present

## 2015-01-02 DIAGNOSIS — N400 Enlarged prostate without lower urinary tract symptoms: Secondary | ICD-10-CM | POA: Diagnosis not present

## 2015-01-02 DIAGNOSIS — R32 Unspecified urinary incontinence: Secondary | ICD-10-CM | POA: Diagnosis not present

## 2015-01-02 DIAGNOSIS — I5032 Chronic diastolic (congestive) heart failure: Secondary | ICD-10-CM | POA: Diagnosis not present

## 2015-01-04 DIAGNOSIS — N400 Enlarged prostate without lower urinary tract symptoms: Secondary | ICD-10-CM | POA: Diagnosis not present

## 2015-01-04 DIAGNOSIS — R32 Unspecified urinary incontinence: Secondary | ICD-10-CM | POA: Diagnosis not present

## 2015-01-04 DIAGNOSIS — R1310 Dysphagia, unspecified: Secondary | ICD-10-CM | POA: Diagnosis not present

## 2015-01-04 DIAGNOSIS — D649 Anemia, unspecified: Secondary | ICD-10-CM | POA: Diagnosis not present

## 2015-01-04 DIAGNOSIS — R627 Adult failure to thrive: Secondary | ICD-10-CM | POA: Diagnosis not present

## 2015-01-04 DIAGNOSIS — Z9181 History of falling: Secondary | ICD-10-CM | POA: Diagnosis not present

## 2015-01-04 DIAGNOSIS — Z87440 Personal history of urinary (tract) infections: Secondary | ICD-10-CM | POA: Diagnosis not present

## 2015-01-04 DIAGNOSIS — I5032 Chronic diastolic (congestive) heart failure: Secondary | ICD-10-CM | POA: Diagnosis not present

## 2015-01-04 DIAGNOSIS — E785 Hyperlipidemia, unspecified: Secondary | ICD-10-CM | POA: Diagnosis not present

## 2015-01-05 DIAGNOSIS — R627 Adult failure to thrive: Secondary | ICD-10-CM | POA: Diagnosis not present

## 2015-01-05 DIAGNOSIS — N400 Enlarged prostate without lower urinary tract symptoms: Secondary | ICD-10-CM | POA: Diagnosis not present

## 2015-01-05 DIAGNOSIS — D649 Anemia, unspecified: Secondary | ICD-10-CM | POA: Diagnosis not present

## 2015-01-05 DIAGNOSIS — Z87440 Personal history of urinary (tract) infections: Secondary | ICD-10-CM | POA: Diagnosis not present

## 2015-01-05 DIAGNOSIS — Z9181 History of falling: Secondary | ICD-10-CM | POA: Diagnosis not present

## 2015-01-05 DIAGNOSIS — E785 Hyperlipidemia, unspecified: Secondary | ICD-10-CM | POA: Diagnosis not present

## 2015-01-05 DIAGNOSIS — R32 Unspecified urinary incontinence: Secondary | ICD-10-CM | POA: Diagnosis not present

## 2015-01-05 DIAGNOSIS — I5032 Chronic diastolic (congestive) heart failure: Secondary | ICD-10-CM | POA: Diagnosis not present

## 2015-01-05 DIAGNOSIS — R1310 Dysphagia, unspecified: Secondary | ICD-10-CM | POA: Diagnosis not present

## 2015-01-09 ENCOUNTER — Telehealth: Payer: Self-pay | Admitting: Family Medicine

## 2015-01-09 DIAGNOSIS — R1310 Dysphagia, unspecified: Secondary | ICD-10-CM | POA: Diagnosis not present

## 2015-01-09 DIAGNOSIS — I5032 Chronic diastolic (congestive) heart failure: Secondary | ICD-10-CM | POA: Diagnosis not present

## 2015-01-09 DIAGNOSIS — N400 Enlarged prostate without lower urinary tract symptoms: Secondary | ICD-10-CM | POA: Diagnosis not present

## 2015-01-09 DIAGNOSIS — D649 Anemia, unspecified: Secondary | ICD-10-CM | POA: Diagnosis not present

## 2015-01-09 DIAGNOSIS — E785 Hyperlipidemia, unspecified: Secondary | ICD-10-CM | POA: Diagnosis not present

## 2015-01-09 DIAGNOSIS — R32 Unspecified urinary incontinence: Secondary | ICD-10-CM | POA: Diagnosis not present

## 2015-01-09 DIAGNOSIS — Z87440 Personal history of urinary (tract) infections: Secondary | ICD-10-CM | POA: Diagnosis not present

## 2015-01-09 DIAGNOSIS — R627 Adult failure to thrive: Secondary | ICD-10-CM | POA: Diagnosis not present

## 2015-01-09 DIAGNOSIS — Z9181 History of falling: Secondary | ICD-10-CM | POA: Diagnosis not present

## 2015-01-09 NOTE — Telephone Encounter (Signed)
Denny Peonrin called while she was checking on patient she states that patient is using his wife's old walker and it doesn't seem to be sturdy. She would like to know if Dr. Tanya NonesPickard would write a new script for a new rolling walker please call her once this has been called in. She believes walkers have to be called into [facility] now.

## 2015-01-10 DIAGNOSIS — R1310 Dysphagia, unspecified: Secondary | ICD-10-CM | POA: Diagnosis not present

## 2015-01-10 DIAGNOSIS — R32 Unspecified urinary incontinence: Secondary | ICD-10-CM | POA: Diagnosis not present

## 2015-01-10 DIAGNOSIS — E785 Hyperlipidemia, unspecified: Secondary | ICD-10-CM | POA: Diagnosis not present

## 2015-01-10 DIAGNOSIS — R627 Adult failure to thrive: Secondary | ICD-10-CM | POA: Diagnosis not present

## 2015-01-10 DIAGNOSIS — Z87440 Personal history of urinary (tract) infections: Secondary | ICD-10-CM | POA: Diagnosis not present

## 2015-01-10 DIAGNOSIS — Z9181 History of falling: Secondary | ICD-10-CM | POA: Diagnosis not present

## 2015-01-10 DIAGNOSIS — N400 Enlarged prostate without lower urinary tract symptoms: Secondary | ICD-10-CM | POA: Diagnosis not present

## 2015-01-10 DIAGNOSIS — D649 Anemia, unspecified: Secondary | ICD-10-CM | POA: Diagnosis not present

## 2015-01-10 DIAGNOSIS — I5032 Chronic diastolic (congestive) heart failure: Secondary | ICD-10-CM | POA: Diagnosis not present

## 2015-01-10 NOTE — Telephone Encounter (Signed)
[facility] and Erin aware

## 2015-01-11 DIAGNOSIS — R627 Adult failure to thrive: Secondary | ICD-10-CM | POA: Diagnosis not present

## 2015-01-11 DIAGNOSIS — R32 Unspecified urinary incontinence: Secondary | ICD-10-CM | POA: Diagnosis not present

## 2015-01-11 DIAGNOSIS — R1310 Dysphagia, unspecified: Secondary | ICD-10-CM | POA: Diagnosis not present

## 2015-01-11 DIAGNOSIS — Z9181 History of falling: Secondary | ICD-10-CM | POA: Diagnosis not present

## 2015-01-11 DIAGNOSIS — D649 Anemia, unspecified: Secondary | ICD-10-CM | POA: Diagnosis not present

## 2015-01-11 DIAGNOSIS — N400 Enlarged prostate without lower urinary tract symptoms: Secondary | ICD-10-CM | POA: Diagnosis not present

## 2015-01-11 DIAGNOSIS — Z87440 Personal history of urinary (tract) infections: Secondary | ICD-10-CM | POA: Diagnosis not present

## 2015-01-11 DIAGNOSIS — I5032 Chronic diastolic (congestive) heart failure: Secondary | ICD-10-CM | POA: Diagnosis not present

## 2015-01-11 DIAGNOSIS — E785 Hyperlipidemia, unspecified: Secondary | ICD-10-CM | POA: Diagnosis not present

## 2015-01-12 DIAGNOSIS — R627 Adult failure to thrive: Secondary | ICD-10-CM | POA: Diagnosis not present

## 2015-01-12 DIAGNOSIS — D649 Anemia, unspecified: Secondary | ICD-10-CM | POA: Diagnosis not present

## 2015-01-12 DIAGNOSIS — R1310 Dysphagia, unspecified: Secondary | ICD-10-CM | POA: Diagnosis not present

## 2015-01-12 DIAGNOSIS — R32 Unspecified urinary incontinence: Secondary | ICD-10-CM | POA: Diagnosis not present

## 2015-01-12 DIAGNOSIS — I5032 Chronic diastolic (congestive) heart failure: Secondary | ICD-10-CM | POA: Diagnosis not present

## 2015-01-12 DIAGNOSIS — Z9181 History of falling: Secondary | ICD-10-CM | POA: Diagnosis not present

## 2015-01-12 DIAGNOSIS — E785 Hyperlipidemia, unspecified: Secondary | ICD-10-CM | POA: Diagnosis not present

## 2015-01-12 DIAGNOSIS — Z87440 Personal history of urinary (tract) infections: Secondary | ICD-10-CM | POA: Diagnosis not present

## 2015-01-12 DIAGNOSIS — N400 Enlarged prostate without lower urinary tract symptoms: Secondary | ICD-10-CM | POA: Diagnosis not present

## 2015-01-17 DIAGNOSIS — D649 Anemia, unspecified: Secondary | ICD-10-CM | POA: Diagnosis not present

## 2015-01-17 DIAGNOSIS — N400 Enlarged prostate without lower urinary tract symptoms: Secondary | ICD-10-CM | POA: Diagnosis not present

## 2015-01-17 DIAGNOSIS — R32 Unspecified urinary incontinence: Secondary | ICD-10-CM | POA: Diagnosis not present

## 2015-01-17 DIAGNOSIS — Z9181 History of falling: Secondary | ICD-10-CM | POA: Diagnosis not present

## 2015-01-17 DIAGNOSIS — Z87440 Personal history of urinary (tract) infections: Secondary | ICD-10-CM | POA: Diagnosis not present

## 2015-01-17 DIAGNOSIS — R627 Adult failure to thrive: Secondary | ICD-10-CM | POA: Diagnosis not present

## 2015-01-17 DIAGNOSIS — I5032 Chronic diastolic (congestive) heart failure: Secondary | ICD-10-CM | POA: Diagnosis not present

## 2015-01-17 DIAGNOSIS — R1310 Dysphagia, unspecified: Secondary | ICD-10-CM | POA: Diagnosis not present

## 2015-01-17 DIAGNOSIS — E785 Hyperlipidemia, unspecified: Secondary | ICD-10-CM | POA: Diagnosis not present

## 2015-01-25 DIAGNOSIS — N400 Enlarged prostate without lower urinary tract symptoms: Secondary | ICD-10-CM | POA: Diagnosis not present

## 2015-01-25 DIAGNOSIS — D649 Anemia, unspecified: Secondary | ICD-10-CM | POA: Diagnosis not present

## 2015-01-25 DIAGNOSIS — I5032 Chronic diastolic (congestive) heart failure: Secondary | ICD-10-CM | POA: Diagnosis not present

## 2015-01-25 DIAGNOSIS — Z9181 History of falling: Secondary | ICD-10-CM | POA: Diagnosis not present

## 2015-01-25 DIAGNOSIS — E785 Hyperlipidemia, unspecified: Secondary | ICD-10-CM | POA: Diagnosis not present

## 2015-01-25 DIAGNOSIS — Z87440 Personal history of urinary (tract) infections: Secondary | ICD-10-CM | POA: Diagnosis not present

## 2015-01-25 DIAGNOSIS — R32 Unspecified urinary incontinence: Secondary | ICD-10-CM | POA: Diagnosis not present

## 2015-01-25 DIAGNOSIS — R627 Adult failure to thrive: Secondary | ICD-10-CM | POA: Diagnosis not present

## 2015-01-25 DIAGNOSIS — R1310 Dysphagia, unspecified: Secondary | ICD-10-CM | POA: Diagnosis not present

## 2015-01-31 DIAGNOSIS — N400 Enlarged prostate without lower urinary tract symptoms: Secondary | ICD-10-CM | POA: Diagnosis not present

## 2015-01-31 DIAGNOSIS — Z87440 Personal history of urinary (tract) infections: Secondary | ICD-10-CM | POA: Diagnosis not present

## 2015-01-31 DIAGNOSIS — M6281 Muscle weakness (generalized): Secondary | ICD-10-CM | POA: Diagnosis not present

## 2015-01-31 DIAGNOSIS — Z9181 History of falling: Secondary | ICD-10-CM | POA: Diagnosis not present

## 2015-01-31 DIAGNOSIS — D649 Anemia, unspecified: Secondary | ICD-10-CM | POA: Diagnosis not present

## 2015-01-31 DIAGNOSIS — R627 Adult failure to thrive: Secondary | ICD-10-CM | POA: Diagnosis not present

## 2015-01-31 DIAGNOSIS — I5023 Acute on chronic systolic (congestive) heart failure: Secondary | ICD-10-CM | POA: Diagnosis not present

## 2015-01-31 DIAGNOSIS — R32 Unspecified urinary incontinence: Secondary | ICD-10-CM | POA: Diagnosis not present

## 2015-01-31 DIAGNOSIS — R1310 Dysphagia, unspecified: Secondary | ICD-10-CM | POA: Diagnosis not present

## 2015-02-07 DIAGNOSIS — R32 Unspecified urinary incontinence: Secondary | ICD-10-CM | POA: Diagnosis not present

## 2015-02-07 DIAGNOSIS — I5023 Acute on chronic systolic (congestive) heart failure: Secondary | ICD-10-CM | POA: Diagnosis not present

## 2015-02-07 DIAGNOSIS — M6281 Muscle weakness (generalized): Secondary | ICD-10-CM | POA: Diagnosis not present

## 2015-02-07 DIAGNOSIS — R1310 Dysphagia, unspecified: Secondary | ICD-10-CM | POA: Diagnosis not present

## 2015-02-07 DIAGNOSIS — R627 Adult failure to thrive: Secondary | ICD-10-CM | POA: Diagnosis not present

## 2015-02-07 DIAGNOSIS — D649 Anemia, unspecified: Secondary | ICD-10-CM | POA: Diagnosis not present

## 2015-02-07 DIAGNOSIS — Z87440 Personal history of urinary (tract) infections: Secondary | ICD-10-CM | POA: Diagnosis not present

## 2015-02-07 DIAGNOSIS — Z9181 History of falling: Secondary | ICD-10-CM | POA: Diagnosis not present

## 2015-02-07 DIAGNOSIS — N400 Enlarged prostate without lower urinary tract symptoms: Secondary | ICD-10-CM | POA: Diagnosis not present

## 2015-02-16 DIAGNOSIS — Z9181 History of falling: Secondary | ICD-10-CM | POA: Diagnosis not present

## 2015-02-16 DIAGNOSIS — D649 Anemia, unspecified: Secondary | ICD-10-CM | POA: Diagnosis not present

## 2015-02-16 DIAGNOSIS — R627 Adult failure to thrive: Secondary | ICD-10-CM | POA: Diagnosis not present

## 2015-02-16 DIAGNOSIS — Z87440 Personal history of urinary (tract) infections: Secondary | ICD-10-CM | POA: Diagnosis not present

## 2015-02-16 DIAGNOSIS — R1310 Dysphagia, unspecified: Secondary | ICD-10-CM | POA: Diagnosis not present

## 2015-02-16 DIAGNOSIS — M6281 Muscle weakness (generalized): Secondary | ICD-10-CM | POA: Diagnosis not present

## 2015-02-16 DIAGNOSIS — I5023 Acute on chronic systolic (congestive) heart failure: Secondary | ICD-10-CM | POA: Diagnosis not present

## 2015-02-16 DIAGNOSIS — N400 Enlarged prostate without lower urinary tract symptoms: Secondary | ICD-10-CM | POA: Diagnosis not present

## 2015-02-16 DIAGNOSIS — R32 Unspecified urinary incontinence: Secondary | ICD-10-CM | POA: Diagnosis not present

## 2015-02-21 DIAGNOSIS — M6281 Muscle weakness (generalized): Secondary | ICD-10-CM | POA: Diagnosis not present

## 2015-02-21 DIAGNOSIS — R627 Adult failure to thrive: Secondary | ICD-10-CM | POA: Diagnosis not present

## 2015-02-21 DIAGNOSIS — I5023 Acute on chronic systolic (congestive) heart failure: Secondary | ICD-10-CM | POA: Diagnosis not present

## 2015-02-21 DIAGNOSIS — R32 Unspecified urinary incontinence: Secondary | ICD-10-CM | POA: Diagnosis not present

## 2015-02-21 DIAGNOSIS — D649 Anemia, unspecified: Secondary | ICD-10-CM | POA: Diagnosis not present

## 2015-02-21 DIAGNOSIS — N400 Enlarged prostate without lower urinary tract symptoms: Secondary | ICD-10-CM | POA: Diagnosis not present

## 2015-02-21 DIAGNOSIS — Z87440 Personal history of urinary (tract) infections: Secondary | ICD-10-CM | POA: Diagnosis not present

## 2015-02-21 DIAGNOSIS — R1310 Dysphagia, unspecified: Secondary | ICD-10-CM | POA: Diagnosis not present

## 2015-02-21 DIAGNOSIS — Z9181 History of falling: Secondary | ICD-10-CM | POA: Diagnosis not present

## 2015-03-01 ENCOUNTER — Telehealth: Payer: Self-pay | Admitting: Family Medicine

## 2015-03-01 DIAGNOSIS — Z9181 History of falling: Secondary | ICD-10-CM | POA: Diagnosis not present

## 2015-03-01 DIAGNOSIS — D649 Anemia, unspecified: Secondary | ICD-10-CM | POA: Diagnosis not present

## 2015-03-01 DIAGNOSIS — R32 Unspecified urinary incontinence: Secondary | ICD-10-CM | POA: Diagnosis not present

## 2015-03-01 DIAGNOSIS — I5023 Acute on chronic systolic (congestive) heart failure: Secondary | ICD-10-CM | POA: Diagnosis not present

## 2015-03-01 DIAGNOSIS — Z87440 Personal history of urinary (tract) infections: Secondary | ICD-10-CM | POA: Diagnosis not present

## 2015-03-01 DIAGNOSIS — N400 Enlarged prostate without lower urinary tract symptoms: Secondary | ICD-10-CM | POA: Diagnosis not present

## 2015-03-01 DIAGNOSIS — M6281 Muscle weakness (generalized): Secondary | ICD-10-CM | POA: Diagnosis not present

## 2015-03-01 DIAGNOSIS — R627 Adult failure to thrive: Secondary | ICD-10-CM | POA: Diagnosis not present

## 2015-03-01 DIAGNOSIS — R1310 Dysphagia, unspecified: Secondary | ICD-10-CM | POA: Diagnosis not present

## 2015-03-01 NOTE — Telephone Encounter (Signed)
HH nurse called to report wound on patient's left ankle.  1 cm x 0.9 cm is size.  Some erythema but no drainage.  Her opinion looks like pressure sore.  Wants OK to apply foam dressing.  Approval given, keep [place] advised as to progress of site.

## 2015-03-04 IMAGING — CR DG ELBOW COMPLETE 3+V*R*
3 series · 3 of 3 positions shown · non-contrast
Comparison: None.

CLINICAL DATA: pain

EXAM:
RIGHT ELBOW - COMPLETE 3+ VIEW

[x elbow ap right]
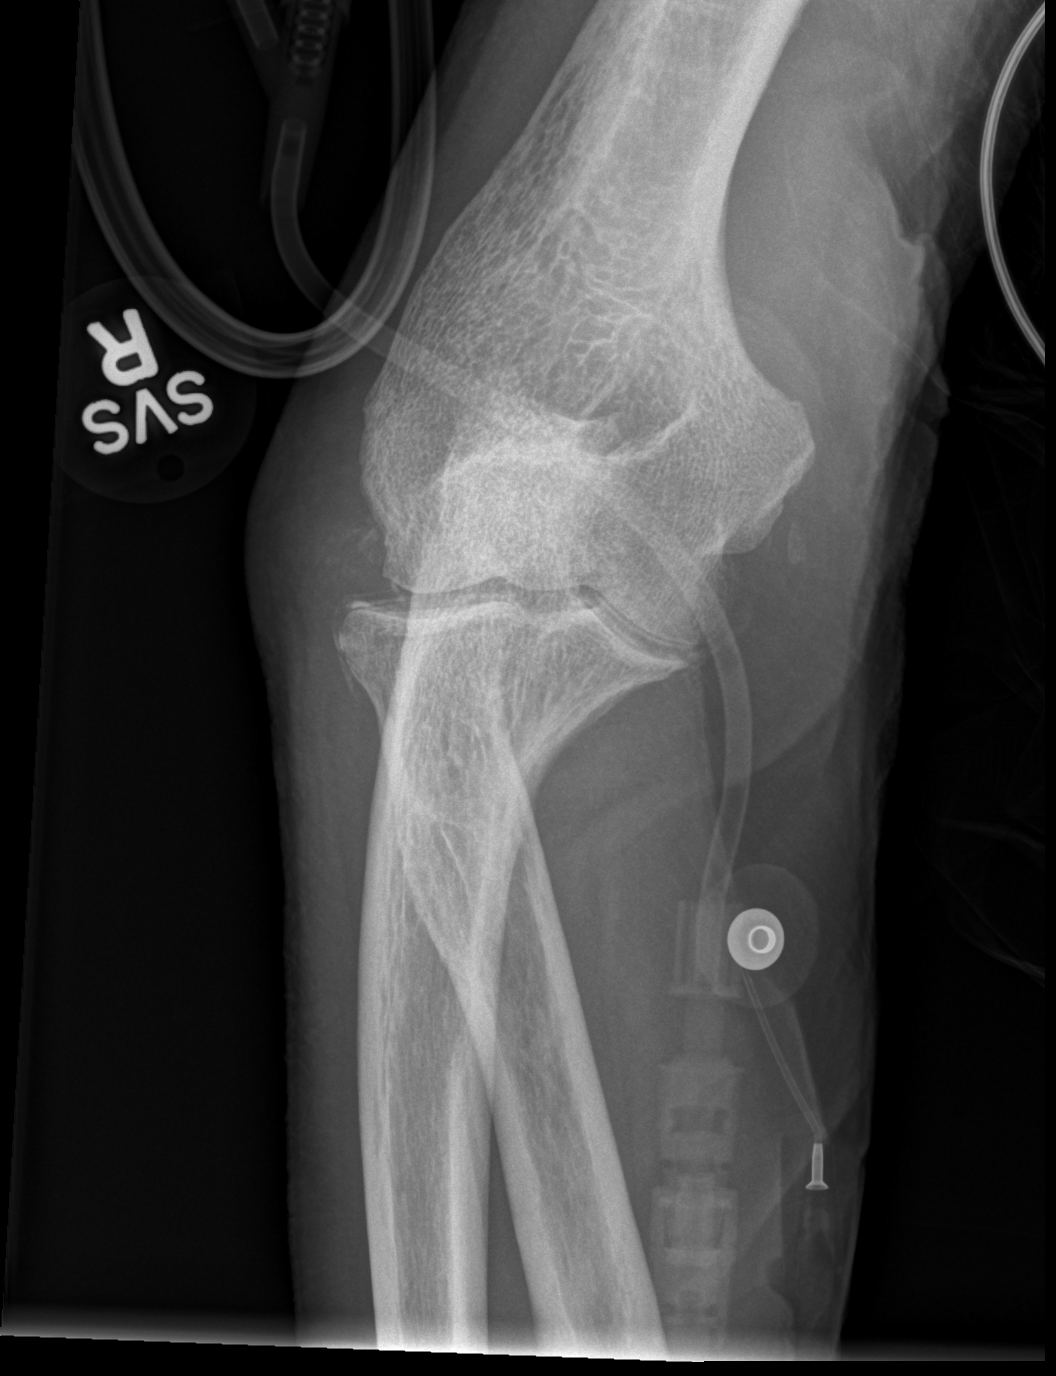

[x elbow obl right]
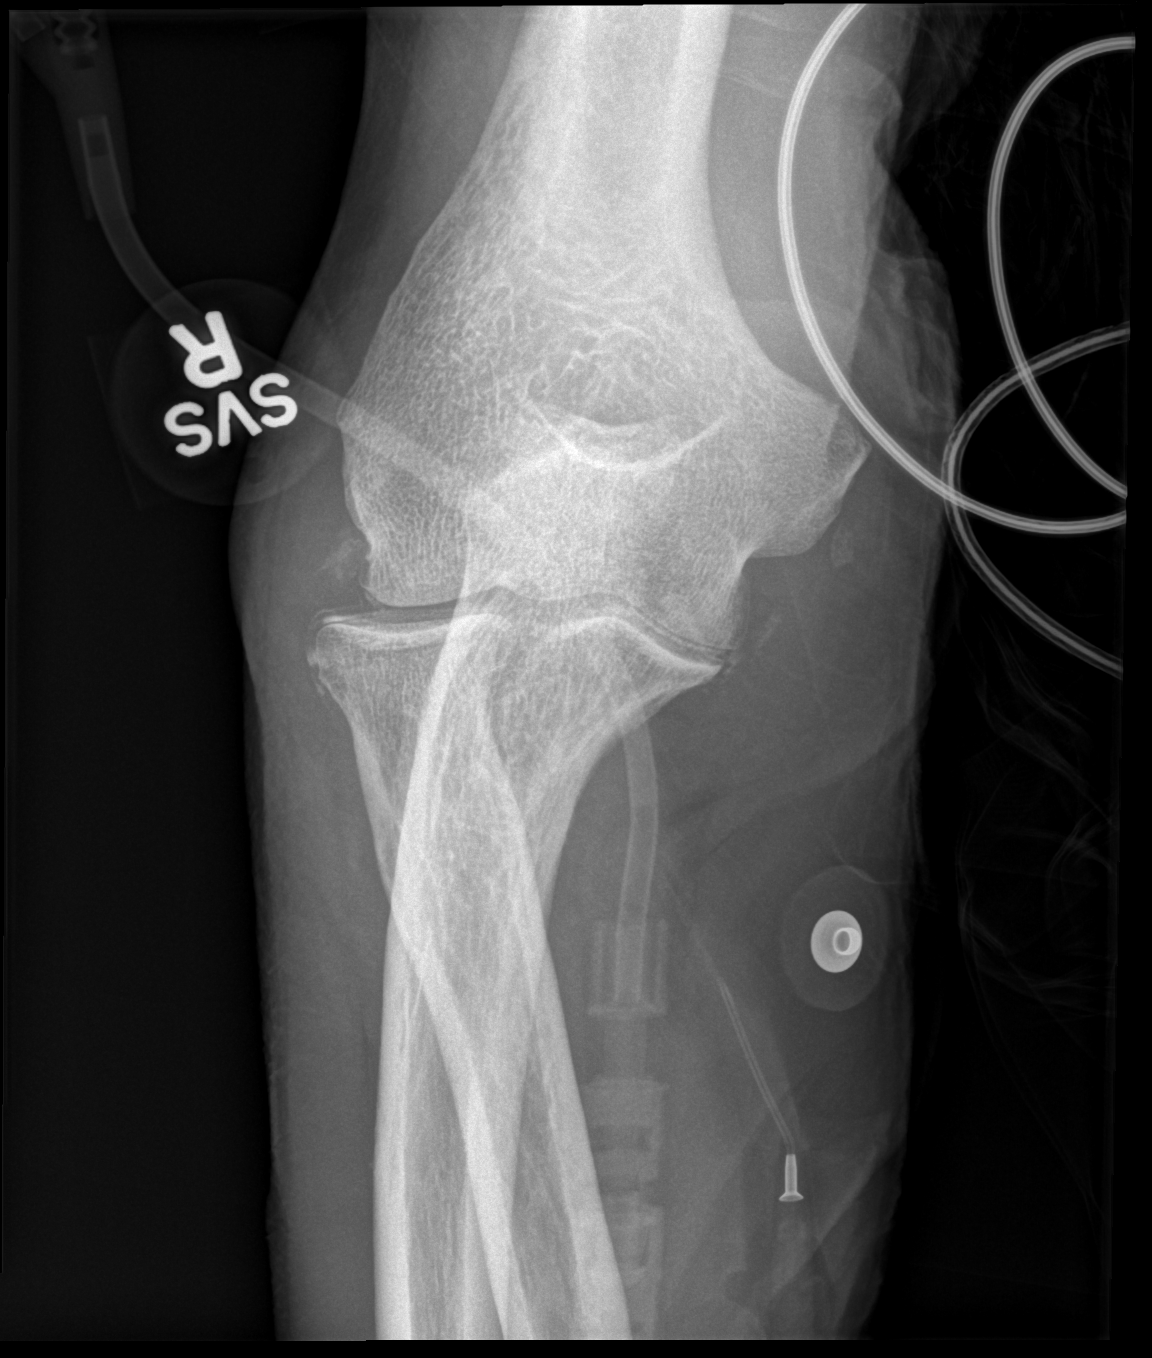

[x elbow lat right]
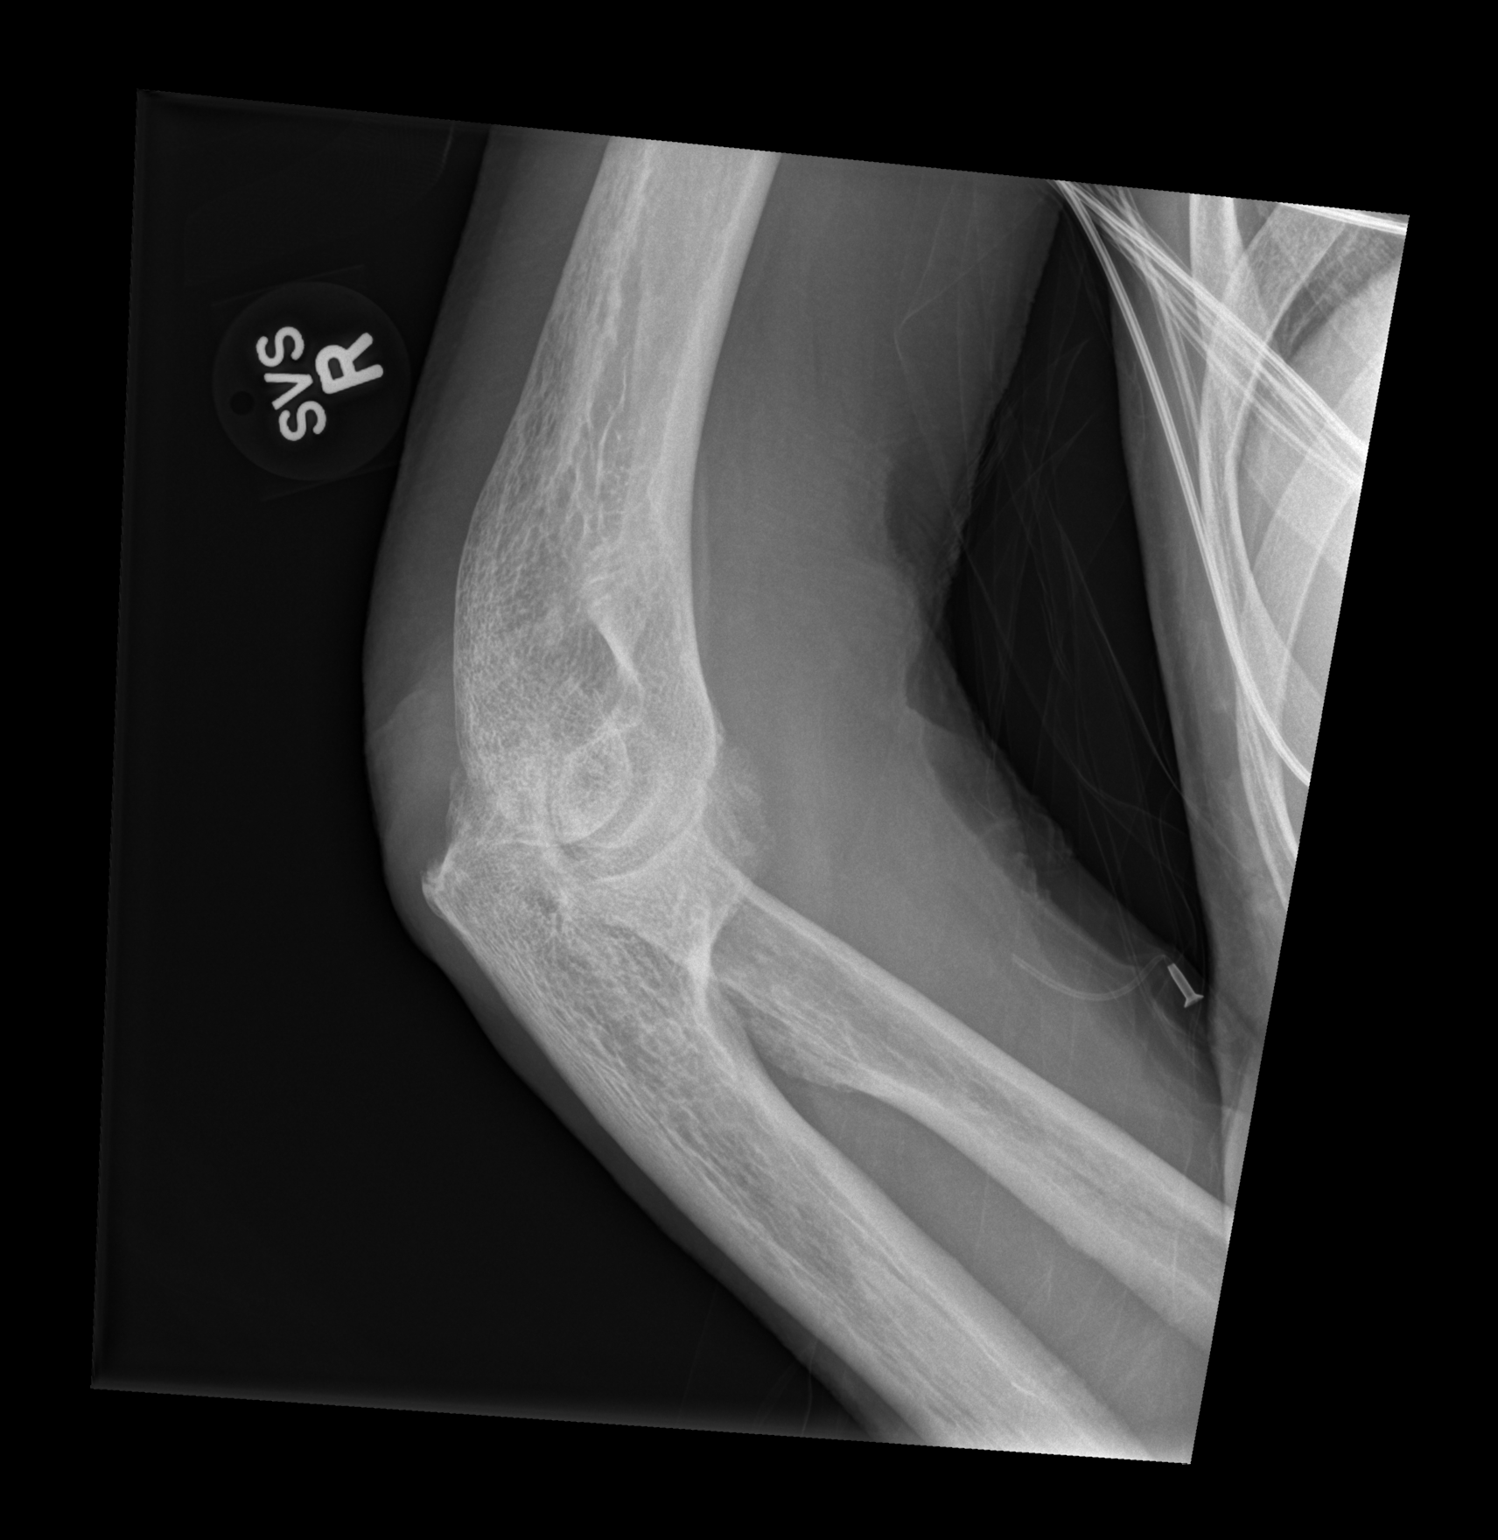

[3 of 3 positions shown; findings below may reference images not displayed]

FINDINGS: There is no evidence of fracture or dislocation. 6 mm corticated
ossicle medial to the medial epicondyle. Mild diffuse
chondrocalcinosis. There is not a good lateral to exclude effusion.
Soft tissues are unremarkable.
IMPRESSION: 1. No fracture or other acute abnormality.
2. Degenerative changes as above.

## 2015-03-07 DIAGNOSIS — I5023 Acute on chronic systolic (congestive) heart failure: Secondary | ICD-10-CM | POA: Diagnosis not present

## 2015-03-07 DIAGNOSIS — R627 Adult failure to thrive: Secondary | ICD-10-CM | POA: Diagnosis not present

## 2015-03-07 DIAGNOSIS — D649 Anemia, unspecified: Secondary | ICD-10-CM | POA: Diagnosis not present

## 2015-03-07 DIAGNOSIS — Z87440 Personal history of urinary (tract) infections: Secondary | ICD-10-CM | POA: Diagnosis not present

## 2015-03-07 DIAGNOSIS — Z9181 History of falling: Secondary | ICD-10-CM | POA: Diagnosis not present

## 2015-03-07 DIAGNOSIS — M6281 Muscle weakness (generalized): Secondary | ICD-10-CM | POA: Diagnosis not present

## 2015-03-07 DIAGNOSIS — N400 Enlarged prostate without lower urinary tract symptoms: Secondary | ICD-10-CM | POA: Diagnosis not present

## 2015-03-07 DIAGNOSIS — R32 Unspecified urinary incontinence: Secondary | ICD-10-CM | POA: Diagnosis not present

## 2015-03-07 DIAGNOSIS — R1310 Dysphagia, unspecified: Secondary | ICD-10-CM | POA: Diagnosis not present

## 2015-03-14 DIAGNOSIS — R1310 Dysphagia, unspecified: Secondary | ICD-10-CM | POA: Diagnosis not present

## 2015-03-14 DIAGNOSIS — N400 Enlarged prostate without lower urinary tract symptoms: Secondary | ICD-10-CM | POA: Diagnosis not present

## 2015-03-14 DIAGNOSIS — I5023 Acute on chronic systolic (congestive) heart failure: Secondary | ICD-10-CM | POA: Diagnosis not present

## 2015-03-14 DIAGNOSIS — M6281 Muscle weakness (generalized): Secondary | ICD-10-CM | POA: Diagnosis not present

## 2015-03-14 DIAGNOSIS — D649 Anemia, unspecified: Secondary | ICD-10-CM | POA: Diagnosis not present

## 2015-03-14 DIAGNOSIS — Z9181 History of falling: Secondary | ICD-10-CM | POA: Diagnosis not present

## 2015-03-14 DIAGNOSIS — R32 Unspecified urinary incontinence: Secondary | ICD-10-CM | POA: Diagnosis not present

## 2015-03-14 DIAGNOSIS — R627 Adult failure to thrive: Secondary | ICD-10-CM | POA: Diagnosis not present

## 2015-03-14 DIAGNOSIS — Z87440 Personal history of urinary (tract) infections: Secondary | ICD-10-CM | POA: Diagnosis not present

## 2015-03-22 ENCOUNTER — Other Ambulatory Visit: Payer: Self-pay | Admitting: Family Medicine

## 2015-03-22 NOTE — Telephone Encounter (Signed)
Refill appropriate and filled per protocol. 

## 2015-03-23 DIAGNOSIS — R32 Unspecified urinary incontinence: Secondary | ICD-10-CM | POA: Diagnosis not present

## 2015-03-23 DIAGNOSIS — Z9181 History of falling: Secondary | ICD-10-CM | POA: Diagnosis not present

## 2015-03-23 DIAGNOSIS — N400 Enlarged prostate without lower urinary tract symptoms: Secondary | ICD-10-CM | POA: Diagnosis not present

## 2015-03-23 DIAGNOSIS — M6281 Muscle weakness (generalized): Secondary | ICD-10-CM | POA: Diagnosis not present

## 2015-03-23 DIAGNOSIS — D649 Anemia, unspecified: Secondary | ICD-10-CM | POA: Diagnosis not present

## 2015-03-23 DIAGNOSIS — Z87440 Personal history of urinary (tract) infections: Secondary | ICD-10-CM | POA: Diagnosis not present

## 2015-03-23 DIAGNOSIS — R1310 Dysphagia, unspecified: Secondary | ICD-10-CM | POA: Diagnosis not present

## 2015-03-23 DIAGNOSIS — I5023 Acute on chronic systolic (congestive) heart failure: Secondary | ICD-10-CM | POA: Diagnosis not present

## 2015-03-23 DIAGNOSIS — R627 Adult failure to thrive: Secondary | ICD-10-CM | POA: Diagnosis not present

## 2015-03-26 DIAGNOSIS — Z87440 Personal history of urinary (tract) infections: Secondary | ICD-10-CM | POA: Diagnosis not present

## 2015-03-26 DIAGNOSIS — M6281 Muscle weakness (generalized): Secondary | ICD-10-CM | POA: Diagnosis not present

## 2015-03-26 DIAGNOSIS — I5023 Acute on chronic systolic (congestive) heart failure: Secondary | ICD-10-CM | POA: Diagnosis not present

## 2015-03-26 DIAGNOSIS — Z9181 History of falling: Secondary | ICD-10-CM | POA: Diagnosis not present

## 2015-03-26 DIAGNOSIS — R627 Adult failure to thrive: Secondary | ICD-10-CM | POA: Diagnosis not present

## 2015-03-26 DIAGNOSIS — D649 Anemia, unspecified: Secondary | ICD-10-CM | POA: Diagnosis not present

## 2015-03-26 DIAGNOSIS — R32 Unspecified urinary incontinence: Secondary | ICD-10-CM | POA: Diagnosis not present

## 2015-03-26 DIAGNOSIS — N400 Enlarged prostate without lower urinary tract symptoms: Secondary | ICD-10-CM | POA: Diagnosis not present

## 2015-03-26 DIAGNOSIS — R1310 Dysphagia, unspecified: Secondary | ICD-10-CM | POA: Diagnosis not present

## 2015-04-04 ENCOUNTER — Encounter: Payer: Self-pay | Admitting: Podiatry

## 2015-04-04 ENCOUNTER — Ambulatory Visit (INDEPENDENT_AMBULATORY_CARE_PROVIDER_SITE_OTHER): Payer: Medicare Other | Admitting: Podiatry

## 2015-04-04 DIAGNOSIS — Z87440 Personal history of urinary (tract) infections: Secondary | ICD-10-CM | POA: Diagnosis not present

## 2015-04-04 DIAGNOSIS — Z9181 History of falling: Secondary | ICD-10-CM | POA: Diagnosis not present

## 2015-04-04 DIAGNOSIS — D649 Anemia, unspecified: Secondary | ICD-10-CM | POA: Diagnosis not present

## 2015-04-04 DIAGNOSIS — R1310 Dysphagia, unspecified: Secondary | ICD-10-CM | POA: Diagnosis not present

## 2015-04-04 DIAGNOSIS — B351 Tinea unguium: Secondary | ICD-10-CM | POA: Diagnosis not present

## 2015-04-04 DIAGNOSIS — I5023 Acute on chronic systolic (congestive) heart failure: Secondary | ICD-10-CM | POA: Diagnosis not present

## 2015-04-04 DIAGNOSIS — Q828 Other specified congenital malformations of skin: Secondary | ICD-10-CM | POA: Diagnosis not present

## 2015-04-04 DIAGNOSIS — N400 Enlarged prostate without lower urinary tract symptoms: Secondary | ICD-10-CM | POA: Diagnosis not present

## 2015-04-04 DIAGNOSIS — R627 Adult failure to thrive: Secondary | ICD-10-CM | POA: Diagnosis not present

## 2015-04-04 DIAGNOSIS — M79676 Pain in unspecified toe(s): Secondary | ICD-10-CM

## 2015-04-04 DIAGNOSIS — R32 Unspecified urinary incontinence: Secondary | ICD-10-CM | POA: Diagnosis not present

## 2015-04-04 DIAGNOSIS — M6281 Muscle weakness (generalized): Secondary | ICD-10-CM | POA: Diagnosis not present

## 2015-04-05 NOTE — Progress Notes (Signed)
Patient ID: NAS WAFER, male   DOB: 11-May-1926, 81 y.o.   MRN: [id number]  Subjective: This patient presents for scheduled visit complaining of painful toenails walking wearing shoes and painful keratoses on the right and left feet  Objective: Poor hygiene with interdigital scaling bilaterally Patient continuously flexes his feet when the nails and keratoses were debrided, making debridement more difficult The toenails are elongated, brittle, incurvated, discolored, deformed tender direct palpation 6-10 Nucleated keratoses plantar second MPJ right plantar second MPJ left and right heel  Assessment: Symptomatic onychomycoses 6-10 Porokeratosis 3 Poor hygiene  Plan: Debridement toenails 10 and mechanically and electrically without any bleeding Debrided porokeratosis 3 without any bleeding  I advised patient to wash their foot in between his toes and right and left feet  Reappoint 3 months

## 2015-04-12 DIAGNOSIS — Z9181 History of falling: Secondary | ICD-10-CM | POA: Diagnosis not present

## 2015-04-12 DIAGNOSIS — I5023 Acute on chronic systolic (congestive) heart failure: Secondary | ICD-10-CM | POA: Diagnosis not present

## 2015-04-12 DIAGNOSIS — M6281 Muscle weakness (generalized): Secondary | ICD-10-CM | POA: Diagnosis not present

## 2015-04-12 DIAGNOSIS — D649 Anemia, unspecified: Secondary | ICD-10-CM | POA: Diagnosis not present

## 2015-04-12 DIAGNOSIS — N400 Enlarged prostate without lower urinary tract symptoms: Secondary | ICD-10-CM | POA: Diagnosis not present

## 2015-04-12 DIAGNOSIS — R627 Adult failure to thrive: Secondary | ICD-10-CM | POA: Diagnosis not present

## 2015-04-12 DIAGNOSIS — R1310 Dysphagia, unspecified: Secondary | ICD-10-CM | POA: Diagnosis not present

## 2015-04-12 DIAGNOSIS — R32 Unspecified urinary incontinence: Secondary | ICD-10-CM | POA: Diagnosis not present

## 2015-04-12 DIAGNOSIS — Z87440 Personal history of urinary (tract) infections: Secondary | ICD-10-CM | POA: Diagnosis not present

## 2015-04-19 DIAGNOSIS — Z9181 History of falling: Secondary | ICD-10-CM | POA: Diagnosis not present

## 2015-04-19 DIAGNOSIS — R627 Adult failure to thrive: Secondary | ICD-10-CM | POA: Diagnosis not present

## 2015-04-19 DIAGNOSIS — R32 Unspecified urinary incontinence: Secondary | ICD-10-CM | POA: Diagnosis not present

## 2015-04-19 DIAGNOSIS — D649 Anemia, unspecified: Secondary | ICD-10-CM | POA: Diagnosis not present

## 2015-04-19 DIAGNOSIS — I5023 Acute on chronic systolic (congestive) heart failure: Secondary | ICD-10-CM | POA: Diagnosis not present

## 2015-04-19 DIAGNOSIS — N400 Enlarged prostate without lower urinary tract symptoms: Secondary | ICD-10-CM | POA: Diagnosis not present

## 2015-04-19 DIAGNOSIS — Z87440 Personal history of urinary (tract) infections: Secondary | ICD-10-CM | POA: Diagnosis not present

## 2015-04-19 DIAGNOSIS — R1310 Dysphagia, unspecified: Secondary | ICD-10-CM | POA: Diagnosis not present

## 2015-04-19 DIAGNOSIS — M6281 Muscle weakness (generalized): Secondary | ICD-10-CM | POA: Diagnosis not present

## 2015-04-26 DIAGNOSIS — D649 Anemia, unspecified: Secondary | ICD-10-CM | POA: Diagnosis not present

## 2015-04-26 DIAGNOSIS — Z9181 History of falling: Secondary | ICD-10-CM | POA: Diagnosis not present

## 2015-04-26 DIAGNOSIS — Z87440 Personal history of urinary (tract) infections: Secondary | ICD-10-CM | POA: Diagnosis not present

## 2015-04-26 DIAGNOSIS — N400 Enlarged prostate without lower urinary tract symptoms: Secondary | ICD-10-CM | POA: Diagnosis not present

## 2015-04-26 DIAGNOSIS — I5023 Acute on chronic systolic (congestive) heart failure: Secondary | ICD-10-CM | POA: Diagnosis not present

## 2015-04-26 DIAGNOSIS — R1310 Dysphagia, unspecified: Secondary | ICD-10-CM | POA: Diagnosis not present

## 2015-04-26 DIAGNOSIS — R32 Unspecified urinary incontinence: Secondary | ICD-10-CM | POA: Diagnosis not present

## 2015-04-26 DIAGNOSIS — R627 Adult failure to thrive: Secondary | ICD-10-CM | POA: Diagnosis not present

## 2015-04-26 DIAGNOSIS — M6281 Muscle weakness (generalized): Secondary | ICD-10-CM | POA: Diagnosis not present

## 2015-05-02 DIAGNOSIS — N400 Enlarged prostate without lower urinary tract symptoms: Secondary | ICD-10-CM | POA: Diagnosis not present

## 2015-05-02 DIAGNOSIS — R1310 Dysphagia, unspecified: Secondary | ICD-10-CM | POA: Diagnosis not present

## 2015-05-02 DIAGNOSIS — R627 Adult failure to thrive: Secondary | ICD-10-CM | POA: Diagnosis not present

## 2015-05-02 DIAGNOSIS — I5023 Acute on chronic systolic (congestive) heart failure: Secondary | ICD-10-CM | POA: Diagnosis not present

## 2015-05-02 DIAGNOSIS — M6281 Muscle weakness (generalized): Secondary | ICD-10-CM | POA: Diagnosis not present

## 2015-05-02 DIAGNOSIS — Z87440 Personal history of urinary (tract) infections: Secondary | ICD-10-CM | POA: Diagnosis not present

## 2015-05-02 DIAGNOSIS — R32 Unspecified urinary incontinence: Secondary | ICD-10-CM | POA: Diagnosis not present

## 2015-05-02 DIAGNOSIS — Z9181 History of falling: Secondary | ICD-10-CM | POA: Diagnosis not present

## 2015-05-02 DIAGNOSIS — D649 Anemia, unspecified: Secondary | ICD-10-CM | POA: Diagnosis not present

## 2015-05-11 DIAGNOSIS — N400 Enlarged prostate without lower urinary tract symptoms: Secondary | ICD-10-CM | POA: Diagnosis not present

## 2015-05-11 DIAGNOSIS — R627 Adult failure to thrive: Secondary | ICD-10-CM | POA: Diagnosis not present

## 2015-05-11 DIAGNOSIS — D649 Anemia, unspecified: Secondary | ICD-10-CM | POA: Diagnosis not present

## 2015-05-11 DIAGNOSIS — R1310 Dysphagia, unspecified: Secondary | ICD-10-CM | POA: Diagnosis not present

## 2015-05-11 DIAGNOSIS — I5023 Acute on chronic systolic (congestive) heart failure: Secondary | ICD-10-CM | POA: Diagnosis not present

## 2015-05-11 DIAGNOSIS — Z87440 Personal history of urinary (tract) infections: Secondary | ICD-10-CM | POA: Diagnosis not present

## 2015-05-11 DIAGNOSIS — R32 Unspecified urinary incontinence: Secondary | ICD-10-CM | POA: Diagnosis not present

## 2015-05-11 DIAGNOSIS — Z9181 History of falling: Secondary | ICD-10-CM | POA: Diagnosis not present

## 2015-05-11 DIAGNOSIS — M6281 Muscle weakness (generalized): Secondary | ICD-10-CM | POA: Diagnosis not present

## 2015-05-17 DIAGNOSIS — H2513 Age-related nuclear cataract, bilateral: Secondary | ICD-10-CM | POA: Diagnosis not present

## 2015-05-17 DIAGNOSIS — H401133 Primary open-angle glaucoma, bilateral, severe stage: Secondary | ICD-10-CM | POA: Diagnosis not present

## 2015-05-24 DIAGNOSIS — I5023 Acute on chronic systolic (congestive) heart failure: Secondary | ICD-10-CM | POA: Diagnosis not present

## 2015-05-24 DIAGNOSIS — N400 Enlarged prostate without lower urinary tract symptoms: Secondary | ICD-10-CM | POA: Diagnosis not present

## 2015-05-24 DIAGNOSIS — R32 Unspecified urinary incontinence: Secondary | ICD-10-CM | POA: Diagnosis not present

## 2015-05-24 DIAGNOSIS — D649 Anemia, unspecified: Secondary | ICD-10-CM | POA: Diagnosis not present

## 2015-05-24 DIAGNOSIS — R1310 Dysphagia, unspecified: Secondary | ICD-10-CM | POA: Diagnosis not present

## 2015-05-24 DIAGNOSIS — R627 Adult failure to thrive: Secondary | ICD-10-CM | POA: Diagnosis not present

## 2015-05-24 DIAGNOSIS — M6281 Muscle weakness (generalized): Secondary | ICD-10-CM | POA: Diagnosis not present

## 2015-05-24 DIAGNOSIS — Z87440 Personal history of urinary (tract) infections: Secondary | ICD-10-CM | POA: Diagnosis not present

## 2015-05-24 DIAGNOSIS — Z9181 History of falling: Secondary | ICD-10-CM | POA: Diagnosis not present

## 2015-06-01 DIAGNOSIS — D649 Anemia, unspecified: Secondary | ICD-10-CM | POA: Diagnosis not present

## 2015-06-01 DIAGNOSIS — R2681 Unsteadiness on feet: Secondary | ICD-10-CM | POA: Diagnosis not present

## 2015-06-01 DIAGNOSIS — R627 Adult failure to thrive: Secondary | ICD-10-CM | POA: Diagnosis not present

## 2015-06-01 DIAGNOSIS — I5033 Acute on chronic diastolic (congestive) heart failure: Secondary | ICD-10-CM | POA: Diagnosis not present

## 2015-06-01 DIAGNOSIS — R350 Frequency of micturition: Secondary | ICD-10-CM | POA: Diagnosis not present

## 2015-06-01 DIAGNOSIS — Z87440 Personal history of urinary (tract) infections: Secondary | ICD-10-CM | POA: Diagnosis not present

## 2015-06-01 DIAGNOSIS — N401 Enlarged prostate with lower urinary tract symptoms: Secondary | ICD-10-CM | POA: Diagnosis not present

## 2015-06-01 DIAGNOSIS — Z9181 History of falling: Secondary | ICD-10-CM | POA: Diagnosis not present

## 2015-07-11 ENCOUNTER — Ambulatory Visit (INDEPENDENT_AMBULATORY_CARE_PROVIDER_SITE_OTHER): Payer: Medicare Other | Admitting: Podiatry

## 2015-07-11 NOTE — Progress Notes (Signed)
Patient ID: [name], male   DOB: 1926/02/26, 82 y.o.   MRN: [id number] NO SHOW

## 2016-04-02 ENCOUNTER — Encounter: Payer: Self-pay | Admitting: Podiatry

## 2016-04-02 ENCOUNTER — Ambulatory Visit (INDEPENDENT_AMBULATORY_CARE_PROVIDER_SITE_OTHER): Payer: Medicare (Managed Care) | Admitting: Podiatry

## 2016-04-02 VITALS — BP 145/83 | HR 82 | Resp 18

## 2016-04-02 DIAGNOSIS — L84 Corns and callosities: Secondary | ICD-10-CM

## 2016-04-02 DIAGNOSIS — B351 Tinea unguium: Secondary | ICD-10-CM

## 2016-04-02 DIAGNOSIS — M79676 Pain in unspecified toe(s): Secondary | ICD-10-CM

## 2016-04-02 NOTE — Patient Instructions (Signed)
Today I trim down the thick fungal toenails in your right and left feet. Also, the corn on the second right toe was trimmed and a pad applied to the toe. Removed pad and 1-3 days Return every 3 months for skin a nail debridement Carrington Clampichard C Tuchman DPM Tried foot center 04/02/2016

## 2016-04-02 NOTE — Progress Notes (Signed)
   Subjective:    Patient ID: Arnette SchaumannGordon Q Diener, male    DOB: 11-14-1925, 82 y.o.   MRN: [id number]  HPI    Patient presents today complaining of painful toenails and painful calluses and requests toenail debridement Patient was last seen on 04/04/2015     Review of Systems  Eyes: Positive for redness.  All other systems reviewed and are negative.      Objective:   Physical Exam   Patient is slow in responding however does appear orientated 3 Patient transfers from wheelchair to treatment chair  DP and PT pulses 2/4 bilaterally In reflex immediate bilaterally Nucleated keratoses dorsal PIPJ second right toe Plantar keratoses second MPJ right and plantar right heel [place]oe 2-5 bilaterally Poor hygiene with interdigital scaling bilaterally Patient continuously flexes his feet when the nails and keratoses were debrided, making debridement more difficult The toenails are elongated, brittle, incurvated, discolored, deformed tender direct palpation 6-10 Nucleated keratoses plantar second MPJ right plantar second MPJ left and right heel         Assessment & Plan:   Assessment: Symptomatic onychomycoses 6-10 Porokeratosis 3 Poor hygiene  Plan: Debridement toenails 10 and mechanically and electrically without any bleeding Debrided porokeratosis 3 without any bleeding  I advised patient to wash their foot in between his toes and right and left feet  Reappoint 3 months

## 2016-07-02 ENCOUNTER — Ambulatory Visit (INDEPENDENT_AMBULATORY_CARE_PROVIDER_SITE_OTHER): Payer: Medicare (Managed Care) | Admitting: Podiatry

## 2016-07-02 NOTE — Progress Notes (Signed)
Erroneous encounter no show  

## 2017-08-11 ENCOUNTER — Other Ambulatory Visit: Payer: Self-pay | Admitting: Family Medicine

## 2017-08-11 DIAGNOSIS — R310 Gross hematuria: Secondary | ICD-10-CM

## 2017-08-15 ENCOUNTER — Other Ambulatory Visit: Payer: Self-pay

## 2017-08-15 ENCOUNTER — Encounter (HOSPITAL_COMMUNITY): Payer: Self-pay

## 2017-08-15 ENCOUNTER — Inpatient Hospital Stay (HOSPITAL_COMMUNITY)
Admission: EM | Admit: 2017-08-15 | Discharge: 2017-08-19 | DRG: 308 | Disposition: A | Discharge status: Skilled Nursing Facility | Payer: Medicare (Managed Care) | Attending: Oncology | Admitting: Oncology

## 2017-08-15 ENCOUNTER — Emergency Department (HOSPITAL_COMMUNITY): Payer: Medicare (Managed Care)

## 2017-08-15 DIAGNOSIS — Z9049 Acquired absence of other specified parts of digestive tract: Secondary | ICD-10-CM | POA: Diagnosis not present

## 2017-08-15 DIAGNOSIS — E785 Hyperlipidemia, unspecified: Secondary | ICD-10-CM | POA: Diagnosis present

## 2017-08-15 DIAGNOSIS — T45515A Adverse effect of anticoagulants, initial encounter: Secondary | ICD-10-CM | POA: Diagnosis not present

## 2017-08-15 DIAGNOSIS — N029 Recurrent and persistent hematuria with unspecified morphologic changes: Secondary | ICD-10-CM | POA: Diagnosis present

## 2017-08-15 DIAGNOSIS — Z681 Body mass index (BMI) 19 or less, adult: Secondary | ICD-10-CM | POA: Diagnosis not present

## 2017-08-15 DIAGNOSIS — N400 Enlarged prostate without lower urinary tract symptoms: Secondary | ICD-10-CM | POA: Diagnosis present

## 2017-08-15 DIAGNOSIS — D539 Nutritional anemia, unspecified: Secondary | ICD-10-CM

## 2017-08-15 DIAGNOSIS — B964 Proteus (mirabilis) (morganii) as the cause of diseases classified elsewhere: Secondary | ICD-10-CM | POA: Diagnosis present

## 2017-08-15 DIAGNOSIS — K449 Diaphragmatic hernia without obstruction or gangrene: Secondary | ICD-10-CM | POA: Diagnosis present

## 2017-08-15 DIAGNOSIS — R627 Adult failure to thrive: Secondary | ICD-10-CM | POA: Diagnosis present

## 2017-08-15 DIAGNOSIS — Z79899 Other long term (current) drug therapy: Secondary | ICD-10-CM

## 2017-08-15 DIAGNOSIS — R531 Weakness: Secondary | ICD-10-CM | POA: Diagnosis present

## 2017-08-15 DIAGNOSIS — Z8249 Family history of ischemic heart disease and other diseases of the circulatory system: Secondary | ICD-10-CM

## 2017-08-15 DIAGNOSIS — B9689 Other specified bacterial agents as the cause of diseases classified elsewhere: Secondary | ICD-10-CM | POA: Diagnosis not present

## 2017-08-15 DIAGNOSIS — R197 Diarrhea, unspecified: Secondary | ICD-10-CM | POA: Diagnosis present

## 2017-08-15 DIAGNOSIS — N183 Chronic kidney disease, stage 3 (moderate): Secondary | ICD-10-CM | POA: Diagnosis present

## 2017-08-15 DIAGNOSIS — R1310 Dysphagia, unspecified: Secondary | ICD-10-CM

## 2017-08-15 DIAGNOSIS — N390 Urinary tract infection, site not specified: Secondary | ICD-10-CM

## 2017-08-15 DIAGNOSIS — I482 Chronic atrial fibrillation: Secondary | ICD-10-CM | POA: Diagnosis not present

## 2017-08-15 DIAGNOSIS — D638 Anemia in other chronic diseases classified elsewhere: Secondary | ICD-10-CM | POA: Diagnosis not present

## 2017-08-15 DIAGNOSIS — I248 Other forms of acute ischemic heart disease: Secondary | ICD-10-CM | POA: Diagnosis present

## 2017-08-15 DIAGNOSIS — I4891 Unspecified atrial fibrillation: Secondary | ICD-10-CM | POA: Diagnosis not present

## 2017-08-15 DIAGNOSIS — E860 Dehydration: Secondary | ICD-10-CM | POA: Diagnosis present

## 2017-08-15 DIAGNOSIS — R5381 Other malaise: Secondary | ICD-10-CM | POA: Diagnosis present

## 2017-08-15 DIAGNOSIS — R64 Cachexia: Secondary | ICD-10-CM | POA: Diagnosis present

## 2017-08-15 DIAGNOSIS — I77819 Aortic ectasia, unspecified site: Secondary | ICD-10-CM | POA: Diagnosis not present

## 2017-08-15 DIAGNOSIS — R310 Gross hematuria: Secondary | ICD-10-CM

## 2017-08-15 DIAGNOSIS — M6250 Muscle wasting and atrophy, not elsewhere classified, unspecified site: Secondary | ICD-10-CM | POA: Diagnosis not present

## 2017-08-15 DIAGNOSIS — D500 Iron deficiency anemia secondary to blood loss (chronic): Secondary | ICD-10-CM | POA: Diagnosis not present

## 2017-08-15 DIAGNOSIS — E43 Unspecified severe protein-calorie malnutrition: Secondary | ICD-10-CM | POA: Diagnosis present

## 2017-08-15 DIAGNOSIS — D6832 Hemorrhagic disorder due to extrinsic circulating anticoagulants: Secondary | ICD-10-CM | POA: Diagnosis not present

## 2017-08-15 DIAGNOSIS — Z85038 Personal history of other malignant neoplasm of large intestine: Secondary | ICD-10-CM | POA: Diagnosis not present

## 2017-08-15 DIAGNOSIS — I071 Rheumatic tricuspid insufficiency: Secondary | ICD-10-CM | POA: Diagnosis not present

## 2017-08-15 DIAGNOSIS — K224 Dyskinesia of esophagus: Secondary | ICD-10-CM | POA: Diagnosis present

## 2017-08-15 DIAGNOSIS — I5032 Chronic diastolic (congestive) heart failure: Secondary | ICD-10-CM | POA: Diagnosis present

## 2017-08-15 LAB — URINALYSIS, ROUTINE W REFLEX MICROSCOPIC
Bilirubin Urine: NEGATIVE
GLUCOSE, UA: NEGATIVE mg/dL
KETONES UR: NEGATIVE mg/dL
Nitrite: NEGATIVE
PH: 8 (ref 5.0–8.0)
Protein, ur: 100 mg/dL — AB
SPECIFIC GRAVITY, URINE: 1.013 (ref 1.005–1.030)
SQUAMOUS EPITHELIAL / LPF: NONE SEEN

## 2017-08-15 LAB — BASIC METABOLIC PANEL
ANION GAP: 10 (ref 5–15)
BUN: 26 mg/dL — ABNORMAL HIGH (ref 6–20)
CALCIUM: 8.4 mg/dL — AB (ref 8.9–10.3)
CO2: 22 mmol/L (ref 22–32)
CREATININE: 1.61 mg/dL — AB (ref 0.61–1.24)
Chloride: 112 mmol/L — ABNORMAL HIGH (ref 101–111)
GFR, EST AFRICAN AMERICAN: 42 mL/min — AB (ref >60)
GFR, EST NON AFRICAN AMERICAN: 36 mL/min — AB (ref >60)
GLUCOSE: 118 mg/dL — AB (ref 65–99)
Potassium: 4 mmol/L (ref 3.5–5.1)
Sodium: 144 mmol/L (ref 135–145)

## 2017-08-15 LAB — PROTIME-INR
INR: 1.24
Prothrombin Time: 15.5 seconds — ABNORMAL HIGH (ref 11.4–15.2)

## 2017-08-15 LAB — CBC
HCT: 32.5 % — ABNORMAL LOW (ref 39.0–52.0)
Hemoglobin: 10.5 g/dL — ABNORMAL LOW (ref 13.0–17.0)
MCH: 32.9 pg (ref 26.0–34.0)
MCHC: 32.3 g/dL (ref 30.0–36.0)
MCV: 101.9 fL — AB (ref 78.0–100.0)
PLATELETS: 188 10*3/uL (ref 150–400)
RBC: 3.19 MIL/uL — AB (ref 4.22–5.81)
RDW: 13.8 % (ref 11.5–15.5)
WBC: 7 10*3/uL (ref 4.0–10.5)

## 2017-08-15 LAB — MAGNESIUM: Magnesium: 2.1 mg/dL (ref 1.7–2.4)

## 2017-08-15 MED ORDER — LATANOPROST 0.005 % OP SOLN
1.0000 [drp] | Freq: Every day | OPHTHALMIC | Status: DC
  Administered 2017-08-16 – 2017-08-18 (×4): 1 [drp] via OPHTHALMIC
  Filled 2017-08-15: qty 2.5

## 2017-08-15 MED ORDER — DILTIAZEM LOAD VIA INFUSION
10.0000 mg | Freq: Once | INTRAVENOUS | Status: AC
  Administered 2017-08-15: 10 mg via INTRAVENOUS
  Filled 2017-08-15: qty 10

## 2017-08-15 MED ORDER — HEPARIN SODIUM (PORCINE) 5000 UNIT/ML IJ SOLN: 5000.0000 [IU] | Freq: Three times a day (TID) | INTRAMUSCULAR | Status: DC

## 2017-08-15 MED ORDER — FINASTERIDE 5 MG PO TABS
5.0000 mg | ORAL_TABLET | Freq: Every day | ORAL | Status: DC
  Administered 2017-08-16 – 2017-08-19 (×4): 5 mg via ORAL
  Filled 2017-08-15 (×4): qty 1

## 2017-08-15 MED ORDER — POLYETHYLENE GLYCOL 3350 17 G PO PACK: 17.0000 g | PACK | Freq: Every day | ORAL | Status: DC | PRN

## 2017-08-15 MED ORDER — SODIUM CHLORIDE 0.9% FLUSH
3.0000 mL | Freq: Two times a day (BID) | INTRAVENOUS | Status: DC
  Administered 2017-08-16 – 2017-08-19 (×6): 3 mL via INTRAVENOUS

## 2017-08-15 MED ORDER — ENSURE ENLIVE PO LIQD
237.0000 mL | Freq: Two times a day (BID) | ORAL | Status: DC
  Administered 2017-08-16 – 2017-08-17 (×3): 237 mL via ORAL
  Filled 2017-08-15: qty 237

## 2017-08-15 MED ORDER — DILTIAZEM HCL-DEXTROSE 100-5 MG/100ML-% IV SOLN (PREMIX)
5.0000 mg/h | INTRAVENOUS | Status: DC
  Administered 2017-08-15: 5 mg/h via INTRAVENOUS
  Filled 2017-08-15 (×2): qty 100

## 2017-08-15 MED ORDER — TAMSULOSIN HCL 0.4 MG PO CAPS
0.4000 mg | ORAL_CAPSULE | Freq: Every day | ORAL | Status: DC
  Administered 2017-08-16 – 2017-08-18 (×3): 0.4 mg via ORAL
  Filled 2017-08-15 (×3): qty 1

## 2017-08-15 MED ORDER — SODIUM CHLORIDE 0.9 % IV BOLUS (SEPSIS)
500.0000 mL | Freq: Once | INTRAVENOUS | Status: AC
  Administered 2017-08-15: 500 mL via INTRAVENOUS

## 2017-08-15 MED ORDER — ACETAMINOPHEN 325 MG PO TABS
650.0000 mg | ORAL_TABLET | Freq: Four times a day (QID) | ORAL | Status: DC | PRN
  Administered 2017-08-17 – 2017-08-18 (×2): 650 mg via ORAL
  Filled 2017-08-15 (×2): qty 2

## 2017-08-15 MED ORDER — ACETAMINOPHEN 650 MG RE SUPP: 650.0000 mg | Freq: Four times a day (QID) | RECTAL | Status: DC | PRN

## 2017-08-15 MED ORDER — SODIUM CHLORIDE 0.9 % IV SOLN
INTRAVENOUS | Status: AC
  Administered 2017-08-16: 02:00:00 via INTRAVENOUS

## 2017-08-15 NOTE — ED Provider Notes (Signed)
[facility] Provider Note   CSN: [id number] Arrival date & time: 08/15/17  1748     History   Chief Complaint Chief Complaint  Patient presents with  . Weakness    HPI [name] is a 84 y.o. male.  HPI  Patient with history of CHF GERD presenting with generalized weakness and fatigue.  Patient states that for the past week he has not felt well has not been eating and drinking well and has been very weak.  He denies any fevers or chills.  He denies any chest pain or difficulty breathing.  He has no abdominal pain.  Family notes he had a Foley catheter removed approximately 6 days ago.  No specific sick contacts.  There are no other associated systemic symptoms, there are no other alleviating or modifying factors.   Past Medical History:  Diagnosis Date  . Aspiration precautions   . BPH (benign prostatic hyperplasia)   . CHF (congestive heart failure) (HCC)   . Diastolic dysfunction   . Fracture of femoral neck, left (HCC) 02/17/2013  . GERD (gastroesophageal reflux disease)   . Hyperlipidemia     Patient Active Problem List   Diagnosis Date Noted  . Diastolic dysfunction   . Aspiration precautions   . Hypernatremia 03/13/2014  . Bacteremia due to Gram-positive bacteria 03/12/2014  . Dysphagia 03/10/2014  . Acute on chronic diastolic heart failure (HCC) 03/10/2014  . Protein-calorie malnutrition, severe (HCC) 03/09/2014  . Chronic diastolic CHF (congestive heart failure) (HCC) 03/09/2014  . Inflammatory arthropathy 03/09/2014  . Anemia 03/08/2014  . UTI (lower urinary tract infection) 03/08/2014  . Hypokalemia 03/08/2014  . Sepsis (HCC) 03/08/2014  . Cellulitis of right hand 02/20/2014  . Chronic ulcer of toe of right foot with fat layer exposed (HCC) 02/20/2014  . Fracture of femoral neck, left (HCC) 02/17/2013  . BPH (benign prostatic hyperplasia)   . GERD (gastroesophageal reflux disease)   . Hyperlipidemia     Past  Surgical History:  Procedure Laterality Date  . COLON SURGERY    . PERCUTANEOUS PINNING Left 02/17/2013   Procedure: PERCUTANEOUS PINNING HIP;  Surgeon: Eulas PostJoshua P Landau, MD;  [facility];  Service: Orthopedics;  Laterality: Left;       Home Medications    Prior to Admission medications   Medication Sig Start Date End Date Taking? Authorizing Provider  carvedilol (COREG) 3.125 MG tablet TAKE 1 TABLET (3.125 MG TOTAL) BY MOUTH 2 (TWO) TIMES DAILY WITH A MEAL. 11/23/14   [provider]  ciprofloxacin (CIPRO) 500 MG tablet TAKE 1 TABLET (500 MG TOTAL) BY MOUTH 2 (TWO) TIMES DAILY. 11/14/14   [provider]  diazepam (VALIUM) 5 MG tablet Take 5 mg by mouth every 12 (twelve) hours as needed for anxiety.    [provider]  diclofenac (VOLTAREN) 0.1 % ophthalmic solution  12/26/14   [provider]  feeding supplement, ENSURE COMPLETE, (ENSURE COMPLETE) LIQD Take 237 mLs by mouth 2 (two) times daily between meals. 03/14/14   Hollice EspyKrishnan, Sendil K, MD  finasteride (PROSCAR) 5 MG tablet Take 5 mg by mouth daily. 09/30/14   [provider]  latanoprost (XALATAN) 0.005 % ophthalmic solution  10/12/14   [provider]  ramipril (ALTACE) 1.25 MG capsule TAKE 1 CAPSULE (1.25 MG TOTAL) BY MOUTH DAILY. 07/24/14   Donita BrooksPickard, Warren T, MD  tamsulosin (FLOMAX) 0.4 MG CAPS capsule TAKE 1 CAPSULE (0.4 MG TOTAL) BY MOUTH DAILY. 03/22/15   Lynnea FerrierPickard, Warren  T, MD    Family History Family History  Problem Relation Age of Onset  . Hypertension Sister   . Migraines Sister     Social History Social History   Tobacco Use  . Smoking status: Never Smoker  Substance Use Topics  . Alcohol use: No  . Drug use: No     Allergies   Patient has no known allergies.   Review of Systems Review of Systems  ROS reviewed and all otherwise negative except for mentioned in HPI   Physical Exam Updated Vital Signs BP 96/69   Pulse 99   Temp 98 F (36.7 C) (Oral)    Resp 20   SpO2 99%  Vitals reviewed Physical Exam  Physical Examination: General appearance - alert, frail and chronically ill appearing Mental status - alert, oriented to person, place, and time Eyes - no conjunctival injection, no scleral icterus Mouth - mucous membranes moist, pharynx normal without lesions Neck - supple, no significant adenopathy Chest - clear to auscultation, no wheezes, rales or rhonchi, symmetric air entry Heart - rapid rate,ir regular rhythm, normal S1, S2, no murmurs, rubs, clicks or gallops Abdomen - soft, nontender, nondistended, no masses or organomegaly Neurological - alert, oriented, normal speech,  Extremities - peripheral pulses normal, no pedal edema, no clubbing or cyanosis Skin - normal coloration and turgor, no rashes,    ED Treatments / Results  Labs (all labs ordered are listed, but only abnormal results are displayed) Labs Reviewed  BASIC METABOLIC PANEL - Abnormal; Notable for the following components:      Result Value   Chloride 112 (*)    Glucose, Bld 118 (*)    BUN 26 (*)    Creatinine, Ser 1.61 (*)    Calcium 8.4 (*)    GFR calc non Af Amer 36 (*)    GFR calc Af Amer 42 (*)    All other components within normal limits  CBC - Abnormal; Notable for the following components:   RBC 3.19 (*)    Hemoglobin 10.5 (*)    HCT 32.5 (*)    MCV 101.9 (*)    All other components within normal limits  MAGNESIUM  URINALYSIS, ROUTINE W REFLEX MICROSCOPIC    EKG  EKG Interpretation  Date/Time:  Saturday August 15 2017 18:14:47 EST Ventricular Rate:  161 PR Interval:    QRS Duration: 91 QT Interval:  303 QTC Calculation: 496 R Axis:   -37 Text Interpretation:  Atrial fibrillation with rapid V-rate Left axis deviation Since previous tracing afib with RVR is new Confirmed by Jerelyn Scott [phone]) on 08/15/2017 6:19:51 PM       Radiology Dg Chest Port 1 View  Result Date: 08/15/2017 CLINICAL DATA:  Atrial fibrillation EXAM: PORTABLE  CHEST 1 VIEW COMPARISON:  Radiographs 03/08/2014, 02/17/2013 FINDINGS: No acute pulmonary infiltrate or effusion. No pneumothorax. Normal heart size. Increased mediastinal width compared to prior with more unfolded appearance of aorta which is slightly augmented by rotation. Aortic atherosclerosis. IMPRESSION: 1. Increased mediastinal width compared to previous radiographs with increased convex appearance of the aorta; while some of this may be augmented by positioning and portable technique, if acute aortic pathology is suspected, further evaluation with CTA would be recommended. 2. Negative for acute infiltrate or edema. Electronically Signed   By: Jasmine Pang M.D.   On: 08/15/2017 19:33    Procedures Procedures (including critical care time)  Medications Ordered in ED Medications  diltiazem (CARDIZEM) 1 mg/mL load via infusion 10 mg (10  mg Intravenous Bolus from Bag 08/15/17 2009)    And  diltiazem (CARDIZEM) 100 mg in dextrose 5% (1 mg/mL) infusion (5 mg/hr Intravenous New Bag/Given 08/15/17 2010)  sodium chloride 0.9 % bolus 500 mL (500 mLs Intravenous New Bag/Given 08/15/17 2009)   CRITICAL CARE Performed by: Phineas Real, Yitzchak Kothari L Total critical care time: 40 minutes Critical care time was exclusive of separately billable procedures and treating other patients. Critical care was necessary to treat or prevent imminent or life-threatening deterioration. Critical care was time spent personally by me on the following activities: development of treatment plan with patient and/or surrogate as well as nursing, discussions with consultants, evaluation of patient's response to treatment, examination of patient, obtaining history from patient or surrogate, ordering and performing treatments and interventions, ordering and review of laboratory studies, ordering and review of radiographic studies, pulse oximetry and re-evaluation of patient's condition.  Initial Impression / Assessment and Plan / ED Course    I have reviewed the triage vital signs and the nursing notes.  Pertinent labs & imaging results that were available during my care of the patient were reviewed by me and considered in my medical decision making (see chart for details).   d/w [facility] on call nurse- they are aware of the admission.  Needs to go to internal medicine teaching service.  HR down to 90s- still in afib.     10:00 PM  D/w internal medicine resident who will see patient in the ED for admission.   Pt started on diltiazem drip, given gentle IV fluids due to soft BP, he is tolerating this well.    Final Clinical Impressions(s) / ED Diagnoses   Final diagnoses:  Atrial fibrillation with rapid ventricular response [facility])    ED Discharge Orders        Ordered    Amb referral to AFIB Clinic     08/15/17 1846       Jamarii Banks, Latanya Maudlin, MD 08/15/17 2201

## 2017-08-15 NOTE — ED Notes (Signed)
Positive for Bedbugs.  Please call [facility] @ [phone] when patient vacates room for Terminex to come spray. ([facility] will call and arrange)

## 2017-08-15 NOTE — ED Notes (Signed)
Family at bedside. 

## 2017-08-15 NOTE — ED Notes (Signed)
Cleaning room at this time to move pt in for VS and EKG. This RN attempting to start second IV for blood work

## 2017-08-15 NOTE — ED Notes (Signed)
Patient is resting comfortably. 

## 2017-08-15 NOTE — ED Notes (Signed)
Unsuccessful attempt to start second IV x 2 

## 2017-08-15 NOTE — H&P (Signed)
Date: 08/15/2017               Patient Name:  [name] MRN: [id number]  DOB: [birth year] Age / Sex: 84 y.o., male   PCP: [facility]         Medical Service: Internal Medicine Teaching Service         Attending Physician: Dr. Phineas Real, Latanya Maudlin, MD    First Contact: Dr. Scherrie Gerlach Pager: [phone]  Second Contact: Dr. 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 The patient's two daughters were at bedside who provided history. They mentioned that they noticed that the patient has been very weak for the past week and sleeping for longer duration than normal. He has had decreased appetite and been more tired.   The patient has a history of failure to thrive that has been documented by his primary care physician. He has lost a lot of weight over the past few years as he is not being able to tolerate anything by mouth. Barium swallow done in 2016 showed a nonspecific esophageal dysmotility disorder, narrowing of the distal esophagus, and a small type 1 hiatal hernia. He was documented to have a weight of 113lbs in May 2016. Since this time he has been recommended to been placed on ensure and a soft diet.   A few weeks ago the patient started having some blood in his urine and he was seen by [facility] who placed a foley catheter. The catheter remained for 1 week and was removed 6 days ago. CT abdomen and pelvis from 2014 showed a small soft tissue lesion within the anterior bladder that could not exclude a urinary bladder carcinoma. He was referred to a urologist at that  time to get cystoscopy, however this was not done.  The patient has been coughing and his mouth has been frothing recently. He has not had any known fever or chills. He has not had any known sick contacts. He has also been having increased episodes of diarrhea recently. No blood noted in stool.   ED course: Tachycardic (110-140), normotensive (107/90), 99% room air, afebrile UA, BMP, magnesium, CBC of normal saline bolus given, diltiazem started  Meds:  No outpatient medications have been marked as taking for the 08/15/17 encounter [facility] Encounter).     Allergies: Allergies as of 08/15/2017  . (No Known Allergies)   Past Medical History:  Diagnosis Date  . Aspiration precautions   . BPH (benign prostatic hyperplasia)   . CHF (congestive heart failure) (HCC)   . Diastolic dysfunction   . Fracture of femoral neck, left (HCC) 02/17/2013  . GERD (gastroesophageal reflux disease)   . Hyperlipidemia     Family History:   Family History  Problem Relation Age of Onset  . Hypertension Sister   . Migraines Sister    Social History:   Social History   Socioeconomic History  . Marital status: Married    Spouse name: Not on file  . Number of children: Not on file  . Years of education: Not on file  .  Highest education level: Not on file  Social Needs  . Financial resource strain: Not on file  . Food insecurity - worry: Not on file  . Food insecurity - inability: Not on file  . Transportation needs - medical: Not on file  . Transportation needs - non-medical: Not on file  Occupational History  . Occupation: Retired  Tobacco Use  . Smoking status: Never Smoker  Substance and Sexual Activity  . Alcohol use: No  . Drug use: No  . Sexual activity: No  Other Topics Concern  . Not on file  Social History Narrative  . Not on file    Review of Systems: A complete ROS was negative except as per HPI.  Cough, diarrhea, weakness  Physical Exam: Blood pressure  96/69, pulse 99, temperature 98 F (36.7 C), temperature source Oral, resp. rate 20, SpO2 99 %.  Physical Exam  Constitutional: He is oriented to person, place, and time. He appears listless. He appears cachectic. He appears ill. He appears distressed.  HENT:  Head: Normocephalic and atraumatic.  Mouth/Throat: Oropharynx is clear and moist.  No posterior pharyngeal erythema or exudate noted  Eyes: Conjunctivae are normal.  Sunken appearing eyes  Cardiovascular: Normal rate and intact distal pulses. An irregularly irregular rhythm present.  Murmur heard. Respiratory: Effort normal and breath sounds normal. No respiratory distress. He has no wheezes.  GI: He exhibits no distension.  Musculoskeletal: Normal range of motion. He exhibits no edema.  Muscle wasting and subcutaneous fat loss  Neurological: He is oriented to person, place, and time. He appears listless.  Skin: Skin is dry. He is not diaphoretic. No erythema.  Superficial lesions noted around anus    CBC    Component Value Date/Time   WBC 7.0 08/15/2017 2041   RBC 3.19 (L) 08/15/2017 2041   HGB 10.5 (L) 08/15/2017 2041   HCT 32.5 (L) 08/15/2017 2041   PLT 188 08/15/2017 2041   MCV 101.9 (H) 08/15/2017 2041   MCH 32.9 08/15/2017 2041   MCHC 32.3 08/15/2017 2041   RDW 13.8 08/15/2017 2041   LYMPHSABS 1.8 11/21/2014 1143   MONOABS 0.6 11/21/2014 1143   EOSABS 0.1 11/21/2014 1143   BASOSABS 0.0 11/21/2014 1143   BMP Latest Ref Rng & Units 08/15/2017 11/27/2014 11/21/2014  Glucose 65 - 99 mg/dL 161(W) 97 960(A)  BUN 6 - 20 mg/dL 54(U) 98(J) 19(J)  Creatinine 0.61 - 1.24 mg/dL 4.78(G) 9.56(O) 1.30(Q)  Sodium 135 - 145 mmol/L 144 136 138  Potassium 3.5 - 5.1 mmol/L 4.0 5.0 4.9  Chloride 101 - 111 mmol/L 112(H) 103 105  CO2 22 - 32 mmol/L 22 24 17(L)  Calcium 8.9 - 10.3 mg/dL 6.5(H) 8.5 9.0   Urinalysis    Component Value Date/Time   COLORURINE YELLOW 08/15/2017 2300   APPEARANCEUR TURBID (A) 08/15/2017 2300   LABSPEC  1.013 08/15/2017 2300   PHURINE 8.0 08/15/2017 2300   GLUCOSEU NEGATIVE 08/15/2017 2300   HGBUR SMALL (A) 08/15/2017 2300   BILIRUBINUR NEGATIVE 08/15/2017 2300   KETONESUR NEGATIVE 08/15/2017 2300   PROTEINUR 100 (A) 08/15/2017 2300   UROBILINOGEN 0.2 04/18/2014 1645   NITRITE NEGATIVE 08/15/2017 2300   LEUKOCYTESUR MODERATE (A) 08/15/2017 2300    Mg=2.1  EKG: personally reviewed my interpretation is atrial fibrillation with left axis deviation  CXR: personally reviewed my interpretation is no infiltrate, vascular congestion, increased mediastinal width. Poor chest film due to ap portable view.  Assessment & Plan by Problem:  84 y.o m with chronic  diastolic heart failure, BPH, hyperlipidemia who presents with malaise weakness for 1 week.  Generalized Weakness The patient has been having a one week history of generalized weakness and fatigue. He has been reported to be having decreased oral intake and therefore dehydration and failure to thrive/protein calorie malnutrition is what is likely causing the patient's weakness. The patient is also in atrial fibrillation rhythm which can cause malaise. There is also concern for underlying malignancy (either due to recurrence of colon cancer or a possible bladder cancer) that can be contributing to the patient's weakness.   He is afebrile and does not have any leukocytosis that indicates any systemic infection. The patient's urinalysis showed presence of many bacteria and moderate amount of leukocytes. However, based on the patient's prior urinalyses he has had chronic pyuria and bacteria on ua. The patient's electrolytes remain normal. His kidney function is at baseline with cr=1.61 and gfr=42.   -Monitor bmp and cbc -Consider palliative care consult to discuss goals of care -PT, OT, and SLP consulted  -Social work consult for skilled nursing facility placement at family request  Atrial Fibrillation  The patient was noted to be in atrial  fibrillation on ekg and has rapid ventricular rate. The patient is hemodynamically stable and therefore will not cardiovert.   It is possible that the patient converted into afib due to exacerbation of his heart failure. His age also places him at higher risk for developing atrial fibrillation. The patient's blood pressure is normotensive and therefore hypertension is unlikely to have caused any structural deformity to heart that pushed into afib. Will order troponin to evaluate if the patient had an MI that pushed him into atrial fibrillation. The patient will also be checked for tsh to note whether that caused afib and changes in his appetite.   The patient has a CHADS-VASC score of 3 based on age and chf history which places him at a 3.2% stroke risk per year. Will anticoagulate the patient.   -IV Diltiazem drip started -Heparin for anticoagulation -TTE -Trend troponin -TSH  Hematuria The patient has been having hematuria for which he is being worked up outpatient by urology. The patient was on foley catheter for one week and was removed off of it 6 days ago. CT abdomen and pelvis from 2014 showed a small soft tissue lesion within the anterior bladder that could not exclude a urinary bladder carcinoma. He was referred to a urologist at that time to get cystoscopy, however this was not done. Urinalysis during this admission showed small amount of hgb per urine dipstick.   -Need to request records from [facility]  Chronic Diastolic Heart Failure The patient's last 03/09/2014 which showed EF 50-55%,Grade 1 diastolic dysfunction, mild tricuspid valve regurgitation. The patient is documented to be taking coreg 3.125mg  bid and ramipril 1.25mg  qd. However, the patient's daughter stated that he has not been taking any medication for his heart failure. Will re-evaluate the patient's heart failure with echo.   -TTE pending  Benign Prostatic Hypertrophy The patient has a history of bph for which  he is being treated. Will continue his home medication.   -In and out cath done for 500ml of urine -Bladder scan q8hrs and in and out if >36500ml -Finasteride 5mg  qd -Tamsuloxin 0.4mg  qd  Macrocytic Anemia The patient's MCV=101.9, hb=10.5, and hct=32.5.   -B12 and folate pending  Dispo: Admit patient to Inpatient with expected length of stay greater than 2 midnights.  SignedLorenso Courier: Sirena Riddle, MD 08/15/2017, 11:04 PM  Pager: [phone]

## 2017-08-15 NOTE — ED Triage Notes (Signed)
Pt from home via EMS for gen weakness x 1 week. Pt also reports diarrhea x 1 week. Denies SOB or CP. EMS VS: 160-170 A fib, 119/79, CBG 137. Pt at baseline neurologically per family. Pt alert and oriented per EMS. 18 G forearm

## 2017-08-16 DIAGNOSIS — D500 Iron deficiency anemia secondary to blood loss (chronic): Secondary | ICD-10-CM

## 2017-08-16 DIAGNOSIS — N183 Chronic kidney disease, stage 3 unspecified: Secondary | ICD-10-CM | POA: Insufficient documentation

## 2017-08-16 DIAGNOSIS — R627 Adult failure to thrive: Secondary | ICD-10-CM

## 2017-08-16 DIAGNOSIS — I5032 Chronic diastolic (congestive) heart failure: Secondary | ICD-10-CM

## 2017-08-16 DIAGNOSIS — E785 Hyperlipidemia, unspecified: Secondary | ICD-10-CM

## 2017-08-16 DIAGNOSIS — N390 Urinary tract infection, site not specified: Secondary | ICD-10-CM

## 2017-08-16 DIAGNOSIS — K449 Diaphragmatic hernia without obstruction or gangrene: Secondary | ICD-10-CM

## 2017-08-16 DIAGNOSIS — K224 Dyskinesia of esophagus: Secondary | ICD-10-CM

## 2017-08-16 DIAGNOSIS — R011 Cardiac murmur, unspecified: Secondary | ICD-10-CM

## 2017-08-16 DIAGNOSIS — N400 Enlarged prostate without lower urinary tract symptoms: Secondary | ICD-10-CM

## 2017-08-16 DIAGNOSIS — Z79899 Other long term (current) drug therapy: Secondary | ICD-10-CM

## 2017-08-16 DIAGNOSIS — Z9049 Acquired absence of other specified parts of digestive tract: Secondary | ICD-10-CM

## 2017-08-16 DIAGNOSIS — B9689 Other specified bacterial agents as the cause of diseases classified elsewhere: Secondary | ICD-10-CM

## 2017-08-16 DIAGNOSIS — I482 Chronic atrial fibrillation: Secondary | ICD-10-CM

## 2017-08-16 DIAGNOSIS — I071 Rheumatic tricuspid insufficiency: Secondary | ICD-10-CM

## 2017-08-16 DIAGNOSIS — R310 Gross hematuria: Secondary | ICD-10-CM

## 2017-08-16 DIAGNOSIS — R1310 Dysphagia, unspecified: Secondary | ICD-10-CM

## 2017-08-16 DIAGNOSIS — Z85038 Personal history of other malignant neoplasm of large intestine: Secondary | ICD-10-CM

## 2017-08-16 LAB — CBC
HEMATOCRIT: 32.9 % — AB (ref 39.0–52.0)
Hemoglobin: 10.8 g/dL — ABNORMAL LOW (ref 13.0–17.0)
MCH: 33.1 pg (ref 26.0–34.0)
MCHC: 32.8 g/dL (ref 30.0–36.0)
MCV: 100.9 fL — ABNORMAL HIGH (ref 78.0–100.0)
PLATELETS: 200 10*3/uL (ref 150–400)
RBC: 3.26 MIL/uL — ABNORMAL LOW (ref 4.22–5.81)
RDW: 13.9 % (ref 11.5–15.5)
WBC: 6.1 10*3/uL (ref 4.0–10.5)

## 2017-08-16 LAB — VITAMIN B12: Vitamin B-12: 646 pg/mL (ref 180–914)

## 2017-08-16 LAB — TROPONIN I
TROPONIN I: 0.07 ng/mL — AB (ref <0.03)
Troponin I: 0.06 ng/mL (ref <0.03)
Troponin I: 0.06 ng/mL (ref <0.03)
Troponin I: 0.06 ng/mL (ref <0.03)

## 2017-08-16 LAB — MAGNESIUM: Magnesium: 2.2 mg/dL (ref 1.7–2.4)

## 2017-08-16 LAB — TSH: TSH: 2.256 u[IU]/mL (ref 0.350–4.500)

## 2017-08-16 LAB — COMPREHENSIVE METABOLIC PANEL
ALT: 10 U/L — ABNORMAL LOW (ref 17–63)
AST: 14 U/L — AB (ref 15–41)
Albumin: 2.2 g/dL — ABNORMAL LOW (ref 3.5–5.0)
Alkaline Phosphatase: 47 U/L (ref 38–126)
Anion gap: 9 (ref 5–15)
BUN: 26 mg/dL — AB (ref 6–20)
CHLORIDE: 114 mmol/L — AB (ref 101–111)
CO2: 20 mmol/L — ABNORMAL LOW (ref 22–32)
Calcium: 8.3 mg/dL — ABNORMAL LOW (ref 8.9–10.3)
Creatinine, Ser: 1.5 mg/dL — ABNORMAL HIGH (ref 0.61–1.24)
GFR calc Af Amer: 45 mL/min — ABNORMAL LOW (ref >60)
GFR calc non Af Amer: 39 mL/min — ABNORMAL LOW (ref >60)
GLUCOSE: 117 mg/dL — AB (ref 65–99)
POTASSIUM: 3.8 mmol/L (ref 3.5–5.1)
Sodium: 143 mmol/L (ref 135–145)
Total Bilirubin: 0.9 mg/dL (ref 0.3–1.2)
Total Protein: 6 g/dL — ABNORMAL LOW (ref 6.5–8.1)

## 2017-08-16 LAB — CORTISOL-AM, BLOOD: Cortisol - AM: 18.6 ug/dL (ref 6.7–22.6)

## 2017-08-16 MED ORDER — HEPARIN (PORCINE) IN NACL 100-0.45 UNIT/ML-% IJ SOLN
600.0000 [IU]/h | INTRAMUSCULAR | Status: DC
  Administered 2017-08-16: 600 [IU]/h via INTRAVENOUS
  Filled 2017-08-16: qty 250

## 2017-08-16 MED ORDER — DEXTROSE 5 % IV SOLN
1.0000 g | INTRAVENOUS | Status: DC
  Administered 2017-08-16 – 2017-08-17 (×2): 1 g via INTRAVENOUS
  Filled 2017-08-16 (×2): qty 10

## 2017-08-16 MED ORDER — HEPARIN BOLUS VIA INFUSION
2700.0000 [IU] | Freq: Once | INTRAVENOUS | Status: AC
  Administered 2017-08-16: 2700 [IU] via INTRAVENOUS
  Filled 2017-08-16: qty 2700

## 2017-08-16 MED ORDER — ENOXAPARIN SODIUM 40 MG/0.4ML ~~LOC~~ SOLN
40.0000 mg | SUBCUTANEOUS | Status: DC
  Administered 2017-08-16 – 2017-08-17 (×2): 40 mg via SUBCUTANEOUS
  Filled 2017-08-16 (×2): qty 0.4

## 2017-08-16 NOTE — ED Notes (Signed)
Admitting Team at the bedside 

## 2017-08-16 NOTE — Progress Notes (Addendum)
Subjective: Doing well this morning when seen on rounds, reports he felt cold but otherwise asymptomatic. Alert and oriented. Does not know why he was brought to the ER. Reports that he has been struggling with poor appetite and not wanting to eat. Denies dysphagia, odynophagia.   Objective:  Vital signs in last 24 hours: Vitals:   08/16/17 0400 08/16/17 0500 08/16/17 0600 08/16/17 0800  BP: 108/64 110/70  111/64  Pulse: 79 74 66 67  Resp: 16 15 20 16   Temp:    97.7 F (36.5 C)  TempSrc:    Oral  SpO2: 99% 98% 100% 100%   Physical Exam Constitutional: NAD, appears comfortable. Severely cachetic with diffuse muscle wasting HEENT: Atraumatic, normocephalic. PERRL, anicteric sclera.  Neck: Supple, trachea midline.  Cardiovascular: distant heart sounds but RRR Pulmonary/Chest: Clear to ascultation anteriorly, no wheezes, rales, or rhonchi. No chest wall abnormalities.  Abdominal: Soft, non tender, non distended. +BS. Well healed midline scar.  Extremities: Warm and well perfused. Distal pulses intact. No edema.  Neurological: A&Ox3, CN II - XII grossly intact. Bilateral strength and sensation intact. Reflexes intact.  Skin: No rashes or erythema  Psychiatric: Normal mood and affect  Assessment/Plan:  84 yo M with medical history significant for chronic diastolic heart failure, BPH, colon cancer s/p sigmoidectomy in 2001 presenting with failure to thrive, generalized weakness, and malaise x 1 week. Found to be in new onset afib with RVR on arrival, initially placed on diltiazem drip but later converted spontaneously to NSR. Trop was mildly elevated 0.06. ED work up significant for a UTI, otherwise unremarkable. CBC, BMP, TSH, and PT/INR normal or at baseline. CXR negative, aside from widening of his aortic arch.   Atrial fibrillation w/ RVR: Presenting with generalized weakness. New onset, unclear precipitating cause. No evidence of ACS, TSH within normal limits. Trop mildly elevated  but this is likely demand ischemia. Obtaining echocardiogram. Patient was placed on a heparin drip on admission due to CHADS-VASC score of 3. Given age and overall prognosis with co morbidities, the risk of anticoagulation likely outweighs any long term benefit. Will stop heparin, discuss with family.  -- Stop heparin -- Tele -- F/u TTE  Chronic Diastolic Heart Failure: Per daughter, patient has stopped all heart failure medications, previously on coreg 1.125 mg BID and ramipril 1.25 mg daily. Patient has been normotensive since admission.  -- Follow up echo   UTI: UA with moderate leukocytes and many bacteria. Patient denies symptoms, however given his age and presentation with generalized weakness and malaise, will treat with abx.  -- IV Ceftriaxone -- F/u urine culture   Failure to Thrive, Hx of Esophogeal Dysmotility: Reports poor appetite and no desire to eat. Likely normal aging process in the 84 yo M with history of heart failure and colon cancer. In the setting normal LFTs, benign exam, and absence of GI symptoms, recurrence or metastatic disease is unlikely. Given his age and overall health he would not be a candidate for aggressive therapies. Of note, patient does have a history of dysphasia due to esophogeal dysmotility seen on barium swallow in 2016, patient currently denies dysphagia. Will follow up SLP evaluation.  -- Obtain weight (not measured this admission)  -- SLP eval  -- Nutrition consult   Hematuria: Follows with [facility]. Some concern for bladder malignancy on CT abdomen and pelvis from 2014, unfortunately patient never received cystoscopy. Again, unlikely to be a surgical candidate.  -- Follow up outpatient with urology   BPH: --  Continue finasteride 5 mg qd -- Tamsuloxin 0.4 mg qd  Chronic Anemia: normocytic / borderline macrocytic. Vitamin B-12 checked on admission is normal. Folate pending. Will check iron studies given his chronic GU blood loss.  -- F/u  iron studies -- F/u Folate   Dispo: Anticipated discharge pending completion of work up, PT evaluation, will likely need SNF.   Reymundo PollGuilloud, Makinzi Prieur, MD 08/16/2017, 10:03 AM Pager: [phone]

## 2017-08-16 NOTE — ED Notes (Signed)
Spoke to IM MD and stated we could stop Cardizem for now and monitor patients Heart Rate

## 2017-08-16 NOTE — ED Notes (Signed)
Patient is resting comfortably. 

## 2017-08-16 NOTE — ED Notes (Signed)
Attempted blood draw. Calling Phlebotomy

## 2017-08-16 NOTE — ED Notes (Signed)
CRITICAL VALUE ALERT  Critical Value: Troponin I  Date & Time Notied:  08/16/2017  Provider Notified:  Dr. Janee Mornhompson  Orders Received/Actions taken: none

## 2017-08-16 NOTE — ED Notes (Signed)
Attempted Iv x2. Unable to access  

## 2017-08-16 NOTE — ED Notes (Signed)
IM resident paged

## 2017-08-16 NOTE — Evaluation (Signed)
Occupational Therapy Evaluation Patient Details Name: [name] MRN: [id number] DOB: Oct 25, 1925 Today's Date: 08/16/2017    History of Present Illness 84 y.o m withchronic diastolic heart failure, BPH, hyperlipidemia who presents with malaise weakness for 1 week, work up underway   Clinical Impression   PTA, pt was living with his son and was receiving assistance for dressing and bathing. Pt currently requiring Max-Total A for ADLs and functional transfer. Pt presenting with generalized weakness and pain, decreased activity tolerance, and decreased cognition. Pt would benefit from further acute OT to facilitate safe dc. Recommend dc to SNF for further OT to increase safety and independence with ADLs and functional mobility as well as decreased caregiver burden.     Follow Up Recommendations  SNF;Supervision/Assistance - 24 hour    Equipment Recommendations  Other (comment)(defer to next venue)    Recommendations for Other Services PT consult     Precautions / Restrictions Precautions Precautions: Fall      Mobility Bed Mobility Overal bed mobility: Needs Assistance Bed Mobility: Rolling;Sidelying to Sit;Sit to Supine Rolling: Mod assist Sidelying to sit: Max assist   Sit to supine: Mod assist   General bed mobility comments: Patient requiers assist for all aspects of bed mobility. Moderate assist to roll to the left, max to the right with noted pain in RLE. Increased physical assist to elevate trunk and bring to EOB  Transfers Overall transfer level: Needs assistance Equipment used: 2 person hand held assist Transfers: Sit to/from [employer] Sit to Stand: Max assist;+2 safety/equipment   Squat pivot transfers: Max assist;+2 safety/equipment     General transfer comment: Unable to reach upright position, max to total assist for all aspects of mobility, attempted sit <> stand x3 with inability to reach upright position despite increased assist.      Balance Overall balance assessment: Needs assistance   Sitting balance-Leahy Scale: Poor Sitting balance - Comments: hands on assist at all times Postural control: Posterior lean   Standing balance-Leahy Scale: Zero                             ADL either performed or assessed with clinical judgement   ADL Overall ADL's : Needs assistance/impaired Eating/Feeding: Maximal assistance   Grooming: Maximal assistance   Upper Body Bathing: Maximal assistance;Sitting   Lower Body Bathing: Total assistance;Sit to/from stand;+2 for physical assistance;+2 for safety/equipment   Upper Body Dressing : Maximal assistance;Sitting   Lower Body Dressing: Total assistance;+2 for physical assistance;+2 for safety/equipment   Toilet Transfer: Maximal assistance;+2 for safety/equipment;+2 for physical assistance;Stand-pivot;BSC   Toileting- Clothing Manipulation and Hygiene: Total assistance;Sit to/from stand;Bed level Toileting - Clothing Manipulation Details (indicate cue type and reason): Pt attempting to perform toielt hygiene in standing but with poor activity tolerance nad high fear of falls. Transitioned to bed to perform toilet hygiene with total A     Functional mobility during ADLs: Maximal assistance;+2 for physical assistance;+2 for safety/equipment(stand pivot only) General ADL Comments: Pt demonstrating poor funcitonal performance, activity tolerance, and cognition. Pt requiring Max A for grooming and feeding and max-total A for dressing, bathing, and toileting.      Vision         Perception     Praxis      Pertinent Vitals/Pain Pain Assessment: Faces Faces Pain Scale: Hurts even more Pain Location: right hip Pain Descriptors / Indicators: Sore;Grimacing;Guarding Pain Intervention(s): Monitored during session;Limited activity within patient's tolerance;Repositioned  Hand Dominance Right   Extremity/Trunk Assessment Upper Extremity Assessment Upper  Extremity Assessment: Generalized weakness   Lower Extremity Assessment Lower Extremity Assessment: Generalized weakness   Cervical / Trunk Assessment Cervical / Trunk Assessment: Kyphotic   Communication Communication Communication: Expressive difficulties   Cognition Arousal/Alertness: Awake/alert Behavior During Therapy: Anxious Overall Cognitive Status: History of cognitive impairments - at baseline                                     General Comments  Son present at begining and end of session    Exercises     Shoulder Instructions      Home Living Family/patient expects to be discharged to:: Skilled nursing facility Living Arrangements: Children                                      Prior Functioning/Environment Level of Independence: Needs assistance    ADL's / Homemaking Assistance Needed: Pt requiring assistance with dressing and bathing from care aide that comes in mornings. Communication / Swallowing Assistance Needed:    Comments: [facility], has an aide at home and currently spends 6-8 hours a day without assist at home. Son also reports recent decline in function for past two weeks        OT Problem List: Decreased strength;Decreased range of motion;Decreased activity tolerance;Impaired balance (sitting and/or standing);Decreased cognition;Decreased safety awareness;Decreased knowledge of use of DME or AE;Pain      OT Treatment/Interventions: Self-care/ADL training;Therapeutic exercise;Energy conservation;DME and/or AE instruction;Therapeutic activities;Patient/family education    OT Goals(Current goals can be found in the care plan section) Acute Rehab OT Goals Patient Stated Goal: to go to bathroom OT Goal Formulation: With patient/family Time For Goal Achievement: 08/30/17 Potential to Achieve Goals: Good ADL Goals Pt Will Perform Grooming: with min assist;sitting Pt Will Perform Upper Body  Dressing: with min assist;sitting Pt Will Transfer to Toilet: with mod assist;stand pivot transfer;bedside commode  OT Frequency: Min 2X/week   Barriers to D/C:            Co-evaluation PT/OT/SLP Co-Evaluation/Treatment: Yes Reason for Co-Treatment: Complexity of the patient's impairments (multi-system involvement);For patient/therapist safety;To address functional/ADL transfers PT goals addressed during session: Mobility/safety with mobility;Balance OT goals addressed during session: ADL's and self-care      AM-PAC PT "6 Clicks" Daily Activity     Outcome Measure Help from another person eating meals?: A Lot Help from another person taking care of personal grooming?: A Lot Help from another person toileting, which includes using toliet, bedpan, or urinal?: Total Help from another person bathing (including washing, rinsing, drying)?: Total Help from another person to put on and taking off regular upper body clothing?: A Lot Help from another person to put on and taking off regular lower body clothing?: Total 6 Click Score: 9   End of Session Nurse Communication: Mobility status  Activity Tolerance: Patient limited by pain;Patient limited by fatigue Patient left: in bed;with call bell/phone within reach;with family/visitor present  OT Visit Diagnosis: Unsteadiness on feet (R26.81);Other abnormalities of gait and mobility (R26.89);Muscle weakness (generalized) (M62.81);Other symptoms and signs involving cognitive function;Pain Pain - part of body: (Generalized)                Time: 1610-9604 OT Time Calculation (min): 28 min Charges:  OT General Charges $OT Visit: 1 Visit OT Evaluation $OT Eval Moderate Complexity: 1 Mod G-Codes:     Makalah Asberry MSOT, OTR/L Acute Rehab Pager: [phone] Office: [phone]  Theodoro GristCharis M Jerelle Virden 08/16/2017, 1:26 PM

## 2017-08-16 NOTE — Evaluation (Signed)
Clinical/Bedside Swallow Evaluation Patient Details  Name: [name] MRN: [id number] Date of Birth: [birth year]  Today's Date: 08/16/2017 Time: SLP Start Time (ACUTE ONLY): 1345 SLP Stop Time (ACUTE ONLY): 1359 SLP Time Calculation (min) (ACUTE ONLY): 14 min  Past Medical History:  Past Medical History:  Diagnosis Date  . Aspiration precautions   . BPH (benign prostatic hyperplasia)   . CHF (congestive heart failure) (HCC)   . Diastolic dysfunction   . Fracture of femoral neck, left (HCC) 02/17/2013  . GERD (gastroesophageal reflux disease)   . Hyperlipidemia    Past Surgical History:  Past Surgical History:  Procedure Laterality Date  . COLON SURGERY    . PERCUTANEOUS PINNING Left 02/17/2013   Procedure: PERCUTANEOUS PINNING HIP;  Surgeon: Eulas PostJoshua P Landau, MD;  [facility];  Service: Orthopedics;  Laterality: Left;   HPI:  84 yo M with medical history significant for chronic diastolic heart failure, BPH, colon cancer s/p sigmoidectomy in 2001 presenting with failure to thrive, generalized weakness, and malaise x 1 week. Found to be in new onset afib with RVR on arrival, initially placed on diltiazem drip but later converted spontaneously to NSR. Trop was mildly elevated 0.06. ED work up significant for a UTI, otherwise unremarkable. CBC, BMP, TSH, and PT/INR normal or at baseline. CXR negative, aside from widening of his aortic arch. Pt known to SLP, from prior admissions. Per MBS, pt demonstrated silent aspiration of all consistencies thinner than puree (03/11/14), however, family decided to continue with comfort feeds with knowledge of high aspiration risk.   Assessment / Plan / Recommendation Clinical Impression  Patient seen in ED for bedside swallow evaluation. Daughters are present and confirm they are aware pt has chronic dysphagia and of silent aspiration seen on MBS in 2015. Chest xray shows no acute infiltrate or edema. Current diet orders are soft solids with honey thick  liquids; daughters state pt does not like honey thick liquids. Pt is fully alert and following basic commands with occasional visual cues as he is hard of hearing. SLP assessed pt with thin liquids and pureed solids. He takes in only a small amount of PO with encouragement. With thin liquids, hyolaryngeal excursion is weak to palpation, consistent with pt's baseline function. There is subtle immediate and delayed throat clearing, suggestive of reduced airway protection. Pt does not appear to be in any discomfort when eating or drinking. Daughter states pt would never want a feeding tube; family is accepting of risks of aspiration and desire for pt to eat and drink for comfort. They request soft diet as pt is edentulous. SLP provided education re: risks of aspiration increased with acute illness, decompensation. Encouraged them to complete regular oral care to reduce oral bacteria load to decrease aspiration risk. Recommend soft diet with thin liquids, meds crushed in puree. As pt is known to aspirate any consistency thinner than puree, thickened liquids do not decrease his aspiration risk and are contraindicated. No further acute needs identified; SLP will s/o.   SLP Visit Diagnosis: Dysphagia, unspecified (R13.10)    Aspiration Risk  Severe aspiration risk    Diet Recommendation Dysphagia 3 (Mech soft);Thin liquid   Liquid Administration via: Cup;Straw Medication Administration: Crushed with puree Supervision: Staff to assist with self feeding    Other  Recommendations Oral Care Recommendations: Oral care QID   Follow up Recommendations None      Frequency and Duration Other (Comment)(evaluation only)          Prognosis Prognosis for  Safe Diet Advancement: Guarded Barriers to Reach Goals: Severity of deficits;Time post onset      Swallow Study   General Date of Onset: 08/15/17 HPI: 84 yo M with medical history significant for chronic diastolic heart failure, BPH, colon cancer s/p  sigmoidectomy in 2001 presenting with failure to thrive, generalized weakness, and malaise x 1 week. Found to be in new onset afib with RVR on arrival, initially placed on diltiazem drip but later converted spontaneously to NSR. Trop was mildly elevated 0.06. ED work up significant for a UTI, otherwise unremarkable. CBC, BMP, TSH, and PT/INR normal or at baseline. CXR negative, aside from widening of his aortic arch. Pt known to SLP, from prior admissions. Per MBS, pt demonstrated silent aspiration of all consistencies thinner than puree (03/11/14), however, family decided to continue with comfort feeds with knowledge of high aspiration risk. Type of Study: Bedside Swallow Evaluation Previous Swallow Assessment: see HPI Diet Prior to this Study: Dysphagia 3 (soft);Honey-thick liquids Temperature Spikes Noted: No Respiratory Status: Room air History of Recent Intubation: No Behavior/Cognition: Alert;Cooperative;Pleasant mood Oral Cavity Assessment: Within Functional Limits Oral Care Completed by SLP: Yes Oral Cavity - Dentition: Edentulous Vision: Impaired for self-feeding Self-Feeding Abilities: Needs assist Patient Positioning: Upright in bed Baseline Vocal Quality: Low vocal intensity;Other (comment)(dysarthric) Volitional Cough: Weak Volitional Swallow: Able to elicit    Oral/Motor/Sensory Function Overall Oral Motor/Sensory Function: Generalized oral weakness   Ice Chips Ice chips: Not tested   Thin Liquid Thin Liquid: Impaired Presentation: Cup;Self Fed Pharyngeal  Phase Impairments: Throat Clearing - Immediate;Throat Clearing - Delayed    Nectar Thick Nectar Thick Liquid: Not tested   Honey Thick Honey Thick Liquid: Not tested   Puree Puree: Within functional limits   Solid   GO   Solid: Not tested       Rondel Baton, MS, CCC-SLP Speech-Language Pathologist [phone]  Arlana Lindau 08/16/2017,2:10 PM

## 2017-08-16 NOTE — Progress Notes (Signed)
ANTICOAGULATION CONSULT NOTE - Initial Consult  Pharmacy Consult for heparin Indication: atrial fibrillation  No Known Allergies  Patient Measurements:   Heparin Dosing Weight:   Vital Signs: Temp: 98 F (36.7 C) (02/09 1806) Temp Source: Oral (02/09 1806) BP: 106/63 (02/09 2330) Pulse Rate: 85 (02/09 2330)  Labs: Recent Labs    08/15/17 2041 08/15/17 2332  HGB 10.5*  --   HCT 32.5*  --   PLT 188  --   LABPROT  --  15.5*  INR  --  1.24  CREATININE 1.61*  --     Medical History: Past Medical History:  Diagnosis Date  . Aspiration precautions   . BPH (benign prostatic hyperplasia)   . CHF (congestive heart failure) (HCC)   . Diastolic dysfunction   . Fracture of femoral neck, left (HCC) 02/17/2013  . GERD (gastroesophageal reflux disease)   . Hyperlipidemia      Assessment: Presents to the ED with generalized weakness, ?FTT. EKG shows new onset AFib - starting heparin gtt. hgb 10.5, plts wnl. Estimated body weight: 45 kg   Goal of Therapy:  Heparin level 0.3-0.7 units/ml Monitor platelets by anticoagulation protocol: Yes    Plan:  -Heparin bolus 2700 units x1 then 600 units/hr -Daily HL, CBC -First level in AM   Neenah Canter, Darl HouseholderAlison M 08/16/2017,12:00 AM

## 2017-08-16 NOTE — Evaluation (Signed)
Physical Therapy Evaluation Patient Details Name: [name] MRN: [id number] DOB: 05-12-1926 Today's Date: 08/16/2017   History of Present Illness  84 y.o m withchronic diastolic heart failure, BPH, hyperlipidemia who presents with malaise weakness for 1 week, work up underway  Clinical Impression  Orders received for PT evaluation. Patient demonstrates deficits in functional mobility as indicated below. Will benefit from continued skilled PT to address deficits and maximize function. Will see as indicated and progress as tolerated.  Will need SNF upon discharge as patient currently with [facility] and family states this is not adequate given current decline.    Follow Up Recommendations SNF;Supervision/Assistance - 24 hour    Equipment Recommendations  Wheelchair (measurements PT)    Recommendations for Other Services       Precautions / Restrictions Precautions Precautions: Fall      Mobility  Bed Mobility Overal bed mobility: Needs Assistance Bed Mobility: Rolling;Sidelying to Sit;Sit to Supine Rolling: Mod assist Sidelying to sit: Max assist   Sit to supine: Mod assist   General bed mobility comments: Patient requiers assist for all aspects of bed mobility. Moderate assist to roll to the left, max to the right with noted pain in RLE. Increased physical assist to elevate trunk and bring to EOB  Transfers Overall transfer level: Needs assistance Equipment used: 2 person hand held assist Transfers: Sit to/from [employer] Transfers Sit to Stand: Max assist;+2 safety/equipment   Squat pivot transfers: Max assist;+2 safety/equipment     General transfer comment: Unable to reach upright position, max to total assist for all aspects of mobility, attempted sit <> stand x3 with inability to reach upright position despite increased assist.   Ambulation/Gait                Stairs            Wheelchair Mobility    Modified Rankin (Stroke  Patients Only)       Balance Overall balance assessment: Needs assistance   Sitting balance-Leahy Scale: Poor Sitting balance - Comments: hands on assist at all times Postural control: Posterior lean   Standing balance-Leahy Scale: Zero                               Pertinent Vitals/Pain Pain Assessment: Faces Faces Pain Scale: Hurts even more Pain Location: right hip Pain Descriptors / Indicators: Sore;Grimacing;Guarding Pain Intervention(s): Monitored during session;Repositioned    Home Living Family/patient expects to be discharged to:: Skilled nursing facility Living Arrangements: Children                    Prior Function Level of Independence: Needs assistance         Comments: per son [facility], has an aide at home and currently spends 6-8 hours a day without assist at home     Hand Dominance   Dominant Hand: Right    Extremity/Trunk Assessment   Upper Extremity Assessment Upper Extremity Assessment: Generalized weakness;Difficult to assess due to impaired cognition    Lower Extremity Assessment Lower Extremity Assessment: Generalized weakness;Difficult to assess due to impaired cognition    Cervical / Trunk Assessment Cervical / Trunk Assessment: Kyphotic  Communication   Communication: Expressive difficulties  Cognition Arousal/Alertness: Awake/alert Behavior During Therapy: Anxious Overall Cognitive Status: History of cognitive impairments - at baseline  General Comments      Exercises     Assessment/Plan    PT Assessment Patient needs continued PT services  PT Problem List Decreased strength;Decreased activity tolerance;Decreased balance;Decreased mobility;Decreased cognition;Decreased safety awareness;Pain       PT Treatment Interventions DME instruction;Functional mobility training;Therapeutic activities;Therapeutic exercise;Balance  training;Neuromuscular re-education;Cognitive remediation;Gait training;Patient/family education;Wheelchair mobility training    PT Goals (Current goals can be found in the Care Plan section)  Acute Rehab PT Goals Patient Stated Goal: to go to bathroom PT Goal Formulation: With patient/family Time For Goal Achievement: 08/30/17 Potential to Achieve Goals: Fair    Frequency Min 2X/week   Barriers to discharge Decreased caregiver support      Co-evaluation PT/OT/SLP Co-Evaluation/Treatment: Yes Reason for Co-Treatment: Complexity of the patient's impairments (multi-system involvement);Necessary to address cognition/behavior during functional activity;For patient/therapist safety;To address functional/ADL transfers PT goals addressed during session: Mobility/safety with mobility;Balance OT goals addressed during session: ADL's and self-care       AM-PAC PT "6 Clicks" Daily Activity  Outcome Measure Difficulty turning over in bed (including adjusting bedclothes, sheets and blankets)?: Unable Difficulty moving from lying on back to sitting on the side of the bed? : Unable Difficulty sitting down on and standing up from a chair with arms (e.g., wheelchair, bedside commode, etc,.)?: Unable Help needed moving to and from a bed to chair (including a wheelchair)?: Total Help needed walking in hospital room?: Total Help needed climbing 3-5 steps with a railing? : Total 6 Click Score: 6    End of Session   Activity Tolerance: No increased pain;Patient limited by fatigue;Other (comment)(incontinence) Patient left: in bed;with call bell/phone within reach;with family/visitor present Nurse Communication: Mobility status PT Visit Diagnosis: Adult, failure to thrive (R62.7);Muscle weakness (generalized) (M62.81)    Time: 1202-1232 PT Time Calculation (min) (ACUTE ONLY): 30 min   Charges:   PT Evaluation $PT Eval Moderate Complexity: 1 Mod     PT G Codes:        Charlotte Crumbevon Beatrice Sehgal, PT DPT   Board Certified Neurologic Specialist [phone]   Fabio AsaDevon J Claudell Wohler 08/16/2017, 1:19 PM

## 2017-08-16 NOTE — ED Notes (Signed)
Soft diet with thin liquids diet lunch tray ordered @ 1401

## 2017-08-17 ENCOUNTER — Other Ambulatory Visit: Payer: Self-pay

## 2017-08-17 ENCOUNTER — Other Ambulatory Visit (HOSPITAL_COMMUNITY): Payer: Medicare (Managed Care)

## 2017-08-17 ENCOUNTER — Inpatient Hospital Stay (HOSPITAL_COMMUNITY): Payer: Medicare (Managed Care)

## 2017-08-17 DIAGNOSIS — M6250 Muscle wasting and atrophy, not elsewhere classified, unspecified site: Secondary | ICD-10-CM

## 2017-08-17 DIAGNOSIS — I4891 Unspecified atrial fibrillation: Secondary | ICD-10-CM

## 2017-08-17 DIAGNOSIS — I77819 Aortic ectasia, unspecified site: Secondary | ICD-10-CM

## 2017-08-17 LAB — TROPONIN I
TROPONIN I: 0.05 ng/mL — AB (ref <0.03)
TROPONIN I: 0.07 ng/mL — AB (ref <0.03)

## 2017-08-17 LAB — ECHOCARDIOGRAM COMPLETE
CHL CUP MV DEC (S): 222
E decel time: 222 msec
FS: 32 % (ref 28–44)
Height: 67 in
IV/PV OW: 0.83
LA diam end sys: 35 mm
LADIAMINDEX: 2.15 cm/m2
LASIZE: 35 mm
MV pk A vel: 51.9 m/s
MV pk E vel: 53.4 m/s
PW: 10.8 mm — AB (ref 0.6–1.1)
WEIGHTICAEL: 1918.88 [oz_av]

## 2017-08-17 LAB — BASIC METABOLIC PANEL
Anion gap: 14 (ref 5–15)
BUN: 23 mg/dL — ABNORMAL HIGH (ref 6–20)
CALCIUM: 8.8 mg/dL — AB (ref 8.9–10.3)
CO2: 19 mmol/L — ABNORMAL LOW (ref 22–32)
CREATININE: 1.25 mg/dL — AB (ref 0.61–1.24)
Chloride: 113 mmol/L — ABNORMAL HIGH (ref 101–111)
GFR, EST AFRICAN AMERICAN: 57 mL/min — AB (ref >60)
GFR, EST NON AFRICAN AMERICAN: 49 mL/min — AB (ref >60)
Glucose, Bld: 86 mg/dL (ref 65–99)
Potassium: 3.8 mmol/L (ref 3.5–5.1)
SODIUM: 146 mmol/L — AB (ref 135–145)

## 2017-08-17 LAB — IRON AND TIBC
Iron: 13 ug/dL — ABNORMAL LOW (ref 45–182)
Saturation Ratios: 8 % — ABNORMAL LOW (ref 17.9–39.5)
TIBC: 167 ug/dL — ABNORMAL LOW (ref 250–450)
UIBC: 154 ug/dL

## 2017-08-17 LAB — FOLATE RBC
FOLATE, HEMOLYSATE: 232.4 ng/mL
FOLATE, RBC: 813 ng/mL (ref >498)
HEMATOCRIT: 28.6 % — AB (ref 37.5–51.0)

## 2017-08-17 LAB — FERRITIN: Ferritin: 369 ng/mL — ABNORMAL HIGH (ref 24–336)

## 2017-08-17 MED ORDER — BOOST / RESOURCE BREEZE PO LIQD CUSTOM
1.0000 | Freq: Three times a day (TID) | ORAL | Status: DC
  Administered 2017-08-17 – 2017-08-19 (×4): 1 via ORAL

## 2017-08-17 MED ORDER — MEGESTROL ACETATE 400 MG/10ML PO SUSP
400.0000 mg | Freq: Two times a day (BID) | ORAL | Status: DC
  Administered 2017-08-17 – 2017-08-19 (×5): 400 mg via ORAL
  Filled 2017-08-17 (×5): qty 10

## 2017-08-17 MED ORDER — CEFTRIAXONE SODIUM 1 G IJ SOLR
1.0000 g | INTRAMUSCULAR | Status: DC
  Administered 2017-08-18: 1 g via INTRAVENOUS
  Filled 2017-08-17: qty 10

## 2017-08-17 MED ORDER — DILTIAZEM HCL ER COATED BEADS 180 MG PO CP24
180.0000 mg | ORAL_CAPSULE | Freq: Every day | ORAL | Status: DC
  Administered 2017-08-17 – 2017-08-19 (×3): 180 mg via ORAL
  Filled 2017-08-17 (×3): qty 1

## 2017-08-17 MED ORDER — PERFLUTREN LIPID MICROSPHERE
1.0000 mL | INTRAVENOUS | Status: AC | PRN
  Administered 2017-08-17: 3 mL via INTRAVENOUS
  Filled 2017-08-17: qty 10

## 2017-08-17 NOTE — Progress Notes (Signed)
Initial Nutrition Assessment  DOCUMENTATION CODES:   Severe malnutrition in context of chronic illness  INTERVENTION:    Boost Breeze po TID, each supplement provides 250 kcal and 9 grams of protein  NUTRITION DIAGNOSIS:   Severe Malnutrition related to chronic illness(CHF) as evidenced by severe fat depletion, severe muscle depletion  GOAL:   Patient will meet greater than or equal to 90% of their needs  MONITOR:   PO intake, Supplement acceptance, Labs, Skin, Weight trends  REASON FOR ASSESSMENT:   Consult Assessment of nutrition requirement/status  ASSESSMENT:   84 yo M with PMH significant for chronic diastolic heart failure, BPH, colon cancer s/p sigmoidectomy in 2001 presenting with failure to thrive, generalized weakness, and malaise x 1 week.  RD spoke with pt's daughter & son. Daughter feeding pt lunch.  S/p bedside swallow evaluation. SLP rec Dysphagia 3, thin liquids diet. PO intake has been very poor at 15% per flowsheet records.  Pt lives alone. Son reports pt has "aides" that come help him at home. Doesn't like Ensure. Gives him loose stools. [facility]. Medications include Megace. Labs reviewed. Na 146 (H).  NUTRITION - FOCUSED PHYSICAL EXAM:    Most Recent Value  Orbital Region  Severe depletion  Upper Arm Region  Severe depletion  Thoracic and Lumbar Region  Unable to assess  Buccal Region  Severe depletion  Temple Region  Severe depletion  Clavicle Bone Region  Severe depletion  Clavicle and Acromion Bone Region  Severe depletion  Scapular Bone Region  Unable to assess  Dorsal Hand  Unable to assess  Patellar Region  Severe depletion  Anterior Thigh Region  Severe depletion  Posterior Calf Region  Severe depletion  Edema (RD Assessment)  None     Diet Order:  Aspiration precautions Fall precautions DIET DYS 3 Room service appropriate? Yes; Fluid consistency: Thin  EDUCATION NEEDS:   No education needs have been  identified at this time  Skin:  Skin Assessment: Reviewed RN Assessment  Last BM:  2/10  Height:   Ht Readings from Last 1 Encounters:  08/17/17 5\' 7"  (1.702 m)   Weight:   Wt Readings from Last 1 Encounters:  08/17/17 119 lb 14.9 oz (54.4 kg)   Ideal Body Weight:  67.2 kg  BMI:  Body mass index is 18.78 kg/m.  Estimated Nutritional Needs:   Kcal:  1300-1450  Protein:  60-75 gm  Fluid:  >/= 1.5 L  Maureen ChattersKatie Barret Esquivel, RD, LDN Pager #: [phone] After-Hours Pager #: [phone]

## 2017-08-17 NOTE — Clinical Social Work Note (Signed)
Clinical Social Work Assessment  Patient Details  Name: [name] MRN: [id number] Date of Birth: [birth year]  Date of referral:  08/17/17               Reason for consult:  Discharge Planning, Facility Placement                Permission sought to share information with:  Family Supports Permission granted to share information::  Yes, Verbal Permission Granted  Name::     [name]   Agency::  [facility]  Relationship::  daughter  Contact Information:  [phone]  Housing/Transportation Living arrangements for the past 2 months:  Single Family Home Source of Information:  Adult Children Patient Interpreter Needed:  None Criminal Activity/Legal Involvement Pertinent to Current Situation/Hospitalization:  No - Comment as needed Significant Relationships:  Spouse, Adult Children Lives with:  Self Do you feel safe going back to the place where you live?  Yes Need for family participation in patient care:  Yes (Comment)  Care giving concerns:  CSW unable to assess patient due to his lack of cognition. CSW spoke to patients daughter Burna MortimerWanda via phone  Social Worker assessment / plan:  CSW spoke to [place] via phone in regards to patients disposition plan. Burna MortimerWanda stated that patient lives at home alone and has support from family members. Burna MortimerWanda stated she is agreeable for patient to discharge to SNF since he has been in the past. Burna MortimerWanda would prefer [facility] because patients wife is currently there. CSW reached out to [facility] social worker to make sure they agree to disposition plan. Social worker stated she will talk to her team and will get back to CSW. Employment status:  Retired Health and safety inspectornsurance information:  Other (Comment Required)([facility]) PT Recommendations:  Skilled Nursing Facility Information / Referral to community resources:  Skilled Nursing Facility  Patient/Family's Response to care: Family supportive of patient and would like patient to discharge to SNF  for short ter rehab.  Patient/Family's Understanding of and Emotional Response to Diagnosis, Current Treatment, and Prognosis:  CSW awaiting response from [facility]  Emotional Assessment Appearance:  Appears stated age Attitude/Demeanor/Rapport:  Unable to Assess Affect (typically observed):  Unable to Assess Orientation:  Oriented to Self Alcohol / Substance use:  Not Applicable Psych involvement (Current and /or in the community):  No (Comment)  Discharge Needs  Concerns to be addressed:  No discharge needs identified Readmission within the last 30 days:  No Current discharge risk:  None Barriers to Discharge:  No Barriers Identified   Althea Charonshley C Jannis Atkins, LCSW 08/17/2017, 3:07 PM

## 2017-08-17 NOTE — Progress Notes (Signed)
Medicine attending: I examined this patient today together with resident physician Dr. Ginger CarneKyler Harden and I concur with his evaluation and management plan which we discussed together. No acute change.  He has reverted back into atrial fibrillation.  He was briefly on a Cardizem infusion and went into sinus rhythm.  I made an error in my note yesterday.  He was not transition to oral calcium channel blocker but we will do this today.  Renal function is borderline.  It would be potentially safe to put him on apixaban 2.5 mg twice daily for stroke prophylaxis. Random a.m. cortisol level is normal.  Multiple assessments of thyroid function including during this admission have been normal. I had a chance to speak with his daughter by phone today.  She is a Risk analystnurse educator who I have work within the past.  She confirms her dad has had a slow decline over the last 2 years.  Family is now interested in having him placed in a skilled nursing facility. He has been evaluated by our physical occupational therapists and they also recommend a SNF. Speech pathology did a bedside swallowing evaluation when the patient first presented to the emergency department.  Patient has known chronic dysphagia and likely silent aspiration.  Recommendation for a soft diet with thin liquids and meds crushed in pure.  We will involve our care management team to locate a skilled nursing bed in coordination with the family.

## 2017-08-17 NOTE — Progress Notes (Signed)
  Echocardiogram 2D Echocardiogram has been performed.  Delcie RochENNINGTON, Tais Koestner 08/17/2017, 4:46 PM

## 2017-08-17 NOTE — Progress Notes (Addendum)
Subjective:  Doing well this morning. Denies chest pain, palpitations, or shortness of breath. Will speak to daughter [name] today.  Objective:  Vital signs in last 24 hours: Vitals:   08/16/17 1515 08/16/17 1625 08/16/17 2147 08/17/17 0341  BP:  (!) 141/83 117/74 (!) 114/96  Pulse: 87 84 95 95  Resp: 19 18 (!) 23 16  Temp:  98.3 F (36.8 C) 98.6 F (37 C) 98.6 F (37 C)  TempSrc:  Oral Oral Oral  SpO2: 99%  95% 97%  Weight:    119 lb 14.9 oz (54.4 kg)   Physical Exam Constitutional: NAD, appears comfortable. Severely cachetic with diffuse muscle wasting HEENT: Atraumatic, normocephalic. PERRL, anicteric sclera. Adentulous Neck: Supple, trachea midline.  Cardiovascular: Irregularly irregular, no m/r/g Pulmonary/Chest: Clear to ascultation anteriorly, no wheezes, rales, or rhonchi. No increased work of breathing. Abdominal: Soft, non tender, non distended. +BS. Well healed midline scar.  Extremities: Warm and well perfused. Distal pulses intact. No edema.  Neurological: A&Ox3, CN II - XII grossly intact. Bilateral strength and sensation intact. Reflexes intact.  Skin: No rashes or erythema  Psychiatric: Normal mood and affect  Assessment/Plan:  84 yo M with medical history significant for chronic diastolic heart failure, BPH, colon cancer s/p sigmoidectomy in 2001 presenting with failure to thrive, generalized weakness, and malaise x 1 week. Found to be in new onset afib with RVR on arrival, initially placed on diltiazem drip but later converted spontaneously to NSR. Trop was mildly elevated 0.06. ED work up significant for a UTI, otherwise unremarkable. CBC, BMP, TSH, and PT/INR normal or at baseline. CXR negative, aside from widening of his aortic arch. Patient converted back to afib - will start oral diltiazem for rate control.  Atrial fibrillation w/ RVR Presenting with generalized weakness. New onset, unclear precipitating cause. No evidence of ACS, TSH within normal  limits. Trop mildly elevated but this is likely demand ischemia. Obtaining echocardiogram. Heparin drip discontinued given that risks of anticoagulation likely outweigh any long-term benefits. - Tele - F/u TTE - PO diltiazem 180mg  daily  HFpEF (TTE in 2015 with LVEF 50-55% and grade 1 DD) Per daughter, patient has stopped all heart failure medications, previously on coreg 1.125 mg BID and ramipril 1.25 mg daily. Patient has been normotensive since admission. Euvolemic on exam. - Follow up echo  UTI UA with moderate leukocytes and many bacteria. Patient denies symptoms, however given his age and presentation with generalized weakness and malaise, will treat with abx. - Continue IV Ceftriaxone (2/10 -> ) - F/u urine culture   Failure to Thrive, Hx of Esophogeal Dysmotility: Reports poor appetite and no desire to eat. Likely normal aging process in the 84 yo M with history of heart failure and colon cancer. In the setting normal LFTs, benign exam, and absence of GI symptoms, recurrence or metastatic disease is unlikely. Given his age and overall health he would not be a candidate for aggressive therapies. Of note, patient does have a history of dysphasia due to esophogeal dysmotility seen on barium swallow in 2016, patient currently denies dysphagia. SLP eval recommend soft diet with thin liquids. - Obtain weight (not measured this admission)  - Nutrition consult - PT/OT -> SNF - Start trial of megace 400mg  twice daily for appetite stimulation  Hematuria Follows with [facility]. Some concern for bladder malignancy on CT abdomen and pelvis from 2014, unfortunately patient never received cystoscopy. Again, unlikely to be a surgical candidate.  - Follow up outpatient with urology  BPH - Continue finasteride 5 mg qd - Tamsuloxin 0.4 mg qd  Chronic Anemia, normocytic / borderline macrocytic Vitamin B-12 checked on admission is normal. Folate pending. Will check iron studies given his  chronic GU blood loss, as well as myeloma screening studies. - F/u iron studies - F/u folate - F/u serum IFE and kappa/lambda light chains  Dispo: Anticipated discharge pending completion of work up, PT evaluation, will likely need SNF.  Scherrie Gerlach, MD 08/17/2017, 9:31 AM Pager: [phone]

## 2017-08-17 NOTE — Discharge Summary (Signed)
Name: [name] MRN: [id number] DOB: Oct 05, 1925 84 y.o. PCP: [facility]  Date of Admission: 08/15/2017  5:48 PM Date of Discharge: 08/19/2017 Attending Physician: Levert Feinstein, MD  Discharge Diagnosis: 1. Atrial fibrillation with RVR 2. Dysphagia 3. UTI 4. Chronic macrocytic/normocytic anemia 5. Failure to thrive  Principal Problem:   Atrial fibrillation with rapid ventricular response (HCC) Active Problems:   BPH (benign prostatic hyperplasia)   Macrocytic anemia   Urinary tract infection with pyuria   Protein-calorie malnutrition, severe (HCC)   Dysphagia   Discharge Medications: Allergies as of 08/19/2017   No Known Allergies     Medication List    STOP taking these medications   ramipril 1.25 MG capsule Commonly known as:  ALTACE     TAKE these medications   acetaminophen 500 MG tablet Commonly known as:  TYLENOL Take 500 mg by mouth 4 (four) times daily as needed for mild pain.   cephALEXin 500 MG capsule Commonly known as:  KEFLEX Take 1 capsule (500 mg total) by mouth 2 (two) times daily for 3 doses.   COMBIGAN 0.2-0.5 % ophthalmic solution Generic drug:  brimonidine-timolol Place 1 drop into the left eye every 12 (twelve) hours.   diltiazem 180 MG 24 hr capsule Commonly known as:  CARDIZEM CD Take 1 capsule (180 mg total) by mouth daily. Start taking on:  08/20/2017   feeding supplement (BOOST BREEZE) Liqd Take 1 Container by mouth 3 (three) times daily between meals. What changed:    when to take this  Another medication with the same name was removed. Continue taking this medication, and follow the directions you see here.   finasteride 5 MG tablet Commonly known as:  PROSCAR Take 5 mg by mouth daily.   hydroxypropyl methylcellulose / hypromellose 2.5 % ophthalmic solution Commonly known as:  ISOPTO TEARS / GONIOVISC Place 1 drop into both eyes 2 (two) times daily as needed for dry eyes (give  2 hours apart from other eye drops).   latanoprost 0.005 % ophthalmic solution Commonly known as:  XALATAN Place 1 drop into both eyes every evening.   megestrol 400 MG/10ML suspension Commonly known as:  MEGACE Take 10 mLs (400 mg total) by mouth 2 (two) times daily.   polyethylene glycol packet Commonly known as:  MIRALAX / GLYCOLAX Take 17 g by mouth daily as needed for mild constipation.   tamsulosin 0.4 MG Caps capsule Commonly known as:  FLOMAX TAKE 1 CAPSULE (0.4 MG TOTAL) BY MOUTH DAILY.       Disposition and follow-up:   [name] was discharged from [facility] in Stable condition.  At the hospital follow up visit please address:  1.  New onset atrial fibrillation - Patient noted to be in afib with RVR on admission, managed with diltiazem drip and transitioned to PO diltiazem. Rate controlled. Patient denies symptoms, please assess adequacy of this regimen. No long-term anticoagulation due to increased risks compared to benefits. We attempted to initiate apixaban 2.5mg  BID, however after starting this, he had worsened hematuria, so this was discontinued.  2.  Patient has chronic hematuria. He follows with [facility]. Please ensure that he was able to follow up with them outpatient for possible further work-up and management. He also has a foley catheter for urinary retention.  3.  Patient treated for UTI with IV ceftriaxone -> PO keflex x5 days total (2/10 - 2/14), please make sure patient was able to  complete this.  4.  Trial of megace for appetite stimulation was initiated. Please assess whether his appetite has improved. He was also discharged with prescription for Boost Breeze TID to assist with adequate nutrition.  5.  Ramipril and carvedilol were held on discharge due to patient stating that he had stopped taking these heart failure medications prior to admission and low-normal BP during hospitalization. He was started on diltiazem for  rate control of afib. Can consider restarting heart failure medications as able.  6.  Labs / imaging needed at time of follow-up: None  7.  Pending labs/ test needing follow-up: Serum IFE  Follow-up Appointments:  Contact information for follow-up providers    [facility]. Schedule an appointment as soon as possible for a visit in 1 week(s).   Contact information: [address] [place] [address] [phone]        [facility]. Go in 2 week(s).   Contact information: [address] [place] [address] [phone]           Contact information for after-discharge care    Destination    [facility] Follow up.   Service:  Skilled Nursing Contact information: [address] [place] [address] [phone]                  Hospital Course by problem list: Principal Problem:   Atrial fibrillation with rapid ventricular response (HCC) Active Problems:   BPH (benign prostatic hyperplasia)   Macrocytic anemia   Urinary tract infection with pyuria   Protein-calorie malnutrition, severe (HCC)   Dysphagia   1. New onset atrial fibrillation Patient initially presented on 2/9 with generalized weakness and was found to be in new onset atrial fibrillation with RVR with heart rate to 130s-140s. He was placed on diltiazem drip and spontaneously converted to NSR, which was transitioned to oral diltiazem. He also was started on a heparin drip for CHADS2-VASc score of at least 3, however the decision was made to discontinue heparin due to the risks of long-term anticoagulation outweighing the benefits given patient's age and multiple co-morbidities. Apixaban 2.5mg  BID was also attempted during his hospitalization for long-term anticoagulation, however due to increased hematuria, this was discontinued. He was continued on prophylactic dose of Lovenox while inpatient.  No clear precipitating cause - no evidence of ACS and TSH normal. Troponin was mildly elevated to 0.07, which was most likely demand ischemia. TTE on 2/11 was unchanged compared to prior - LVEF 55% and grade 1 DD. Patient remained in afib, rate controlled on diltiazem. No symptoms.  2. UTI UA showed moderate leukocytes, many WBC, and many bacteria without squamous epithelial cells. Patient was afebrile, HDS, and without leukocytosis. He denied dysuria or urinary symptoms, however given patient's age and overall debility, the decision was made to treat for UTI. Patient received IV ceftriaxone, which was transitioned to PO keflex BID for a total of 5 days (2/10 -> 2/14). Urine culture grew Proteus mirabilis, sensitive to cephalosporins.  3. Chronic macrocytic/normocytic anemia Patient has a history of chronic GU blood loss. Vitamin B12 normal, folate normal. Iron studies suggested anemia of chronic disease. Kappa/lambda ratio was mildly decreased at 0.23. No need for further work-up of this. Serum IFE was pending at the time of discharge.  4. Chronic hematuria, BPH Follows with [facility]. Patient had increased hematuria while on apixaban (as above), so apixaban was discontinued. There was  some concern for bladder malignancy on CT abd/pelvis in 2014, however patient unfortunately never received cystoscopy. Unlikely to be a surgical candidate, however will need continued follow up with Urology for definitive plans. Patient also has urinary retention and foley catheter was placed with plan to follow up with Urology outpatient.  5. HFpEF Repeat echo showed LVEF 55%, grade 1 DD, and mild focal basal septal hypertrophy. Patient states he stopped all of his heart failure medications, previously on coreg 3.125mg  BID and ramipril 1.25mg  daily. He was started on diltiazem during this admission for rate control of afib. Coreg and ramipril were held during his admission and on discharge and low-normal BP  limited ability to restart heart failure medications.  6. Generalized weakness, dysphagia, failure to thrive Patient reports poor appetite and decreased desire to eat, which is most likely a result of the normal aging process. Barium swallow in 2016 showed benign narrowing of distal esophagus. He has a history of stage I colon cancer that was resected, however recurrence or metastatic disease is unlikely given his normal labs, overall benign exam, and absence of GI symptoms. Further, patient's age and overall health make him a poor candidate for aggressive therapy, thus more extensive work-up/evaluation for malignancy was not completed during this admission. Other labs, including TSH and AM cortisol, did not show an etiology. SLP evaluation recommended soft diet with thin liquids and medications crushed in puree. Nutrition consult was obtained and recommended Boost Breeze TID between meals. Megace trial was initiated while inpatient to stimulate appetite. He was able to tolerate increased PO intake on discharge. PT/OT recommended SNF placement, which family was amenable to.  Discharge Vitals:   BP 114/66 (BP Location: Left Arm)   Pulse 69   Temp 98.2 F (36.8 C) (Oral)   Resp 16   Ht 5\' 7"  (1.702 m)   Wt 116 lb 6.5 oz (52.8 kg)   SpO2 100%   BMI 18.23 kg/m   Pertinent Labs, Studies, and Procedures:  CBC Latest Ref Rng & Units 08/16/2017 08/16/2017 08/15/2017  WBC 4.0 - 10.5 K/uL 6.1 - 7.0  Hemoglobin 13.0 - 17.0 g/dL 10.8(L) - 10.5(L)  Hematocrit 37.5 - 51.0 % 32.9(L) 28.6(L) 32.5(L)  Platelets 150 - 400 K/uL 200 - 188   CMP Latest Ref Rng & Units 08/17/2017 08/16/2017 08/15/2017  Glucose 65 - 99 mg/dL 86 657(Q117(H) 469(G118(H)  BUN 6 - 20 mg/dL 29(B23(H) 28(U26(H) 13(K26(H)  Creatinine 0.61 - 1.24 mg/dL 4.40(N1.25(H) 0.27(O1.50(H) 5.36(U1.61(H)  Sodium 135 - 145 mmol/L 146(H) 143 144  Potassium 3.5 - 5.1 mmol/L 3.8 3.8 4.0  Chloride 101 - 111 mmol/L 113(H) 114(H) 112(H)  CO2 22 - 32 mmol/L 19(L) 20(L) 22  Calcium 8.9 - 10.3 mg/dL  4.4(I8.8(L) 8.3(L) 8.4(L)  Total Protein 6.5 - 8.1 g/dL - 6.0(L) -  Total Bilirubin 0.3 - 1.2 mg/dL - 0.9 -  Alkaline Phos 38 - 126 U/L - 47 -  AST 15 - 41 U/L - 14(L) -  ALT 17 - 63 U/L - 10(L) -   TSH 2.256 INR 1.24, PT 15.5 Trop 0.06 -> 0.07 -> 0.06 -> 0.07 -> 0.05 Mg 2.1 UA with small Hgb, 100 prot, moderate leukocytes, 0-5 RBC, too numerous to count WBC, many bacteria, no squamous epithelial, +WBC clumps UCx with >100K Proteus mirabilis, sensitive to cephalosporins Vit B12 646 Folate 813 Random AM cortisol 18.6 Ferritin 369 Iron 13, TIBC 167, sat ratio 8 Kappa free light chain 62.8 Lambda free light chain 269.1 Kappa/lambda light chain ratio  0.23 Serum IFE pending  CXR 08/15/2017 1. Increased mediastinal width compared to previous radiographs with increased convex appearance of the aorta; while some of this may be augmented by positioning and portable technique, if acute aortic pathology is suspected, further evaluation with CTA would be recommended. 2. Negative for acute infiltrate or edema.  TTE 08/17/2017 Study Conclusions - Left ventricle: The cavity size was normal. There was mild focal   basal hypertrophy of the septum. The estimated ejection fraction   was 55%. Although no diagnostic regional wall motion abnormality   was identified, this possibility cannot be completely excluded on   the basis of this study. Doppler parameters are consistent with   abnormal left ventricular relaxation (grade 1 diastolic   dysfunction). - Aortic valve: Trileaflet; moderately calcified leaflets. There   was no stenosis. - Aorta: Mildly dilated aortic root. Aortic root dimension: 39 mm   (ED). - Mitral valve: There was no significant regurgitation. - Right ventricle: The cavity size was normal. Systolic function   was normal. - Inferior vena cava: The vessel was normal in size. The   respirophasic diameter changes were in the normal range (>= 50%),   consistent with normal central  venous pressure. - Pericardium, extracardiac: A trivial pericardial effusion was   identified.  Impressions: - Normal LV size with mild focal basal septal hypertrophy, EF 55%.   Normal RV size and systolic function. No significant valvular   abnormalities.  Discharge Instructions: Discharge Instructions    Amb referral to AFIB Clinic   Complete by:  As directed    Call MD for:  difficulty breathing, headache or visual disturbances   Complete by:  As directed    Call MD for:  persistant dizziness or light-headedness   Complete by:  As directed    Call MD for:  persistant nausea and vomiting   Complete by:  As directed    Call MD for:  severe uncontrolled pain   Complete by:  As directed    Call MD for:  temperature >100.4   Complete by:  As directed    Diet - low sodium heart healthy   Complete by:  As directed    Discharge instructions   Complete by:  As directed    Mr. [name], [name] were in the hospital for several reasons. The first is that your heart is a funny rhythm, called atrial fibrillation. For this condition, you will take diltiazem 180mg  once a day to help control your heart rate. Please follow up with the heart doctors who will help you manage this.  You were also treated for an infection of your bladder. Please take Keflex 500mg  every 12 hours for 3 more doses (you will stop at the end of the day on 2/14).  To help your appetite, we started you on a medicine called Megace. Please take 10mL by mouth twice a day. You can also drink Boost Breeze three times a day.  For the blood in your urine, please follow up with your outpatient Urologist.   Increase activity slowly   Complete by:  As directed      Signed: Scherrie Gerlach, MD 08/19/2017, 11:33 AM   Pager: Demetrius Charity [phone]

## 2017-08-17 NOTE — Plan of Care (Signed)
  Progressing Education: Knowledge of General Education information will improve 08/17/2017 0127 - Progressing by Jill SideNiemela, Devondre Guzzetta R, RN Health Behavior/Discharge Planning: Ability to manage health-related needs will improve 08/17/2017 0127 - Progressing by Jill SideNiemela, Katura Eatherly R, RN Elimination: Will not experience complications related to bowel motility 08/17/2017 0127 - Progressing by Jill SideNiemela, Royalty Fakhouri R, RN Will not experience complications related to urinary retention 08/17/2017 0127 - Progressing by Jill SideNiemela, Jillyn Stacey R, RN

## 2017-08-18 ENCOUNTER — Inpatient Hospital Stay: Admission: RE | Admit: 2017-08-18 | Payer: Medicare Other | Source: Ambulatory Visit

## 2017-08-18 DIAGNOSIS — D638 Anemia in other chronic diseases classified elsewhere: Secondary | ICD-10-CM

## 2017-08-18 DIAGNOSIS — I422 Other hypertrophic cardiomyopathy: Secondary | ICD-10-CM

## 2017-08-18 DIAGNOSIS — B964 Proteus (mirabilis) (morganii) as the cause of diseases classified elsewhere: Secondary | ICD-10-CM

## 2017-08-18 LAB — URINE CULTURE: Culture: >=100000 — AB

## 2017-08-18 LAB — KAPPA/LAMBDA LIGHT CHAINS
KAPPA FREE LGHT CHN: 62.8 mg/L — AB (ref 3.3–19.4)
KAPPA, LAMDA LIGHT CHAIN RATIO: 0.23 — AB (ref 0.26–1.65)
LAMDA FREE LIGHT CHAINS: 269.1 mg/L — AB (ref 5.7–26.3)

## 2017-08-18 MED ORDER — APIXABAN 2.5 MG PO TABS
2.5000 mg | ORAL_TABLET | Freq: Two times a day (BID) | ORAL | Status: DC
  Administered 2017-08-18 (×2): 2.5 mg via ORAL
  Filled 2017-08-18 (×2): qty 1

## 2017-08-18 MED ORDER — CEPHALEXIN 500 MG PO CAPS
500.0000 mg | ORAL_CAPSULE | Freq: Two times a day (BID) | ORAL | Status: DC
  Administered 2017-08-19: 500 mg via ORAL
  Filled 2017-08-18 (×2): qty 1

## 2017-08-18 NOTE — Progress Notes (Signed)
CSW following patient for disposition need. [facility] medically [facility].   Marrianne MoodAshley Neils [place], MSW,  [employer] [phone]

## 2017-08-18 NOTE — Progress Notes (Signed)
Urine output red and clear. Progressed from cloudy brown to red clear. Pt not in pain.

## 2017-08-18 NOTE — Progress Notes (Signed)
Subjective:  Doing well this morning. Denies chest pain, palpitations, or shortness of breath. Continues to state that he has no appetite. Nutrition recommended Boost Breeze. TTE with LVEF 55%, grade 1 DD, mild focal basal septal hypertrophy.  Objective:  Vital signs in last 24 hours: Vitals:   08/17/17 2056 08/17/17 2349 08/18/17 0428 08/18/17 0749  BP: (!) 103/59 98/63 (!) 95/55 105/70  Pulse:    82  Resp: 18 16 16  (!) 21  Temp: 97.6 F (36.4 C)  98.3 F (36.8 C)   TempSrc: Oral  Oral   SpO2: 96% 99% 100% 99%  Weight:   113 lb 8.6 oz (51.5 kg)   Height:       Physical Exam Constitutional: NAD, appears comfortable. Severely cachetic with diffuse muscle wasting HEENT: Atraumatic, normocephalic. PERRL, anicteric sclera. Adentulous Neck: Supple, trachea midline.  Cardiovascular: Irregularly irregular, no m/r/g Pulmonary/Chest: Clear to ascultation anteriorly, no wheezes, rales, or rhonchi. No increased work of breathing. Abdominal: Soft, non tender, non distended. +BS. Well healed midline scar.  Extremities: Warm and well perfused. Distal pulses intact. No edema.  Neurological: A&Ox3, CN II - XII grossly intact. Bilateral strength and sensation intact. Reflexes intact.  Skin: No rashes or erythema  Psychiatric: Normal mood and affect  Assessment/Plan:  Mr. Thilges is a 84yo M with PMH significant for HFpEF, BPH, colon cancer s/p sigmoidectomy in 2001 presenting with failure to thrive, generalized weakness, and malaise x 1 week. Found to have UTI and to be in new onset afib with RVR without clear etiology on labs or imaging. TTE with LVEF 55%, grade 1 DD. Now on oral diltiazem for rate control and will start apixaban for long-term anticoagulation.  Atrial fibrillation w/ RVR Unclear precipitating cause. Mild trop elevation likely 2/2 demand ischemia. Echo unremarkable - LVEF 55%, grade 1 DD, mild focal basal septal hypertrophy. CHADS2-VASc score of at least 3, with 3.2% stroke risk  per year. Will start low dose apixaban for long-term anticoagulation. Still in afib, HR in 90s. - Telemetry - Apixaban 2.5mg  BID - PO diltiazem 180mg  daily - D/c to SNF when able  HFpEF (TTE in 2015 with LVEF 50-55% and grade 1 DD) Repeat TTE with LVEF 55%, grade 1 DD, mild focal basal septal hypertrophy. Patient had stopped all heart failure medications, previously on coreg 3.125 mg BID and ramipril 1.25 mg daily. Soft BP - 95/55 this AM, limiting ability to restart these medications. Euvolemic on exam. - Continue to monitor  UTI UA with moderate leukocytes and many bacteria. Patient denies symptoms, however given his age and presentation with generalized weakness and malaise, will treat with abx. UCx growing Proteus, sensitive to ceftriaxone and cefazolin. IV ceftriaxone started 2/10, will transition to PO Keflex for 5 total days of treatment. - Change IV Ceftriaxone to PO Keflex 500mg  BID x5d, starting tomorrow (2/10 -> 2/15)  Failure to Thrive, Hx of Esophogeal Dysmotility Reports poor appetite and no desire to eat. Likely normal aging process in the 84 yo M with history of heart failure and colon cancer. SLP eval recommend soft diet with thin liquids. Nutrition recommending Boost Breeze, as patient does not like Ensure. - Boost Breeze TID - PT/OT -> SNF - Social work consulted - D/c to SNF when able - Start trial of megace 400mg  twice daily for appetite stimulation  Hematuria [facility]. Some concern for bladder malignancy on CT abdomen and pelvis from 2014, unfortunately patient never received cystoscopy. Again, unlikely to be a surgical candidate.  -  Follow up outpatient with urology  BPH - Continue finasteride 5 mg qd - Tamsuloxin 0.4 mg qd  Anemia of chronic disease Vitamin B-12 and folate normal. Iron studies suggest anemia of chronic disease. Myeloma screening studies pending. - F/u serum IFE and kappa/lambda light chains  Dispo: Anticipated discharge  pending SNF placement.  Scherrie GerlachHuang, Ciin Brazzel, MD 08/18/2017, 8:58 AM Pager: [phone]

## 2017-08-18 NOTE — Progress Notes (Signed)
Medicine attending: I examined this patient today together with resident physician Dr. Colbert Ewing and I concur with her evaluation and management plan. We appreciate nutritional consult.  Recommendations made which we will follow. He remains in atrial fibrillation but rate controlled on oral long-acting Cardizem.  We will start dose adjusted apixaban for stroke prophylaxis.  Echocardiogram done today.  Results pending. Urine growing Proteus.  Sensitivities pending.  Currently on oral cephalexin which should cover this bacteria. Iron studies consistent with anemia of chronic disease although elevated ferritin in the face of a macrocytic anemia can also be seen with myelodysplastic syndrome, refractory anemia sideroblastic type which could be in the differential and someone his age.  Hemoglobin stable to improved during this admission and there is some component related to his chronic renal insufficiency so I see no need to put him through a bone marrow biopsy.  Lab to screen for multiple myeloma drawn and results pending. Care management working with the family for at least temporary placement in a skilled nursing facility.  Apparently his wife is also in a facility right now and we will try to get the patient in the same place.

## 2017-08-18 NOTE — NC FL2 (Signed)
[place] MEDICAID FL2 LEVEL OF CARE SCREENING TOOL     IDENTIFICATION  Patient Name: [name] Birthdate: 10-02-1925 Sex: male Admission Date (Current Location): 08/15/2017  [facility] and [place] Number:  Producer, television/film/video and Address:  [facility], [address] [place], [place] [address]      Provider Number: [id number]  Attending Physician Name and Address:  Levert Feinstein, MD  Relative Name and Phone Number:  [name], [phone]    Current Level of Care: Hospital Recommended Level of Care: Skilled Nursing Facility Prior Approval Number:    Date Approved/Denied:   PASRR Number: [id number]  Discharge Plan: SNF    Current Diagnoses: Patient Active Problem List   Diagnosis Date Noted  . Chronic renal insufficiency, stage 3 (moderate) (HCC)   . Atrial fibrillation with rapid ventricular response (HCC) 08/15/2017  . Diastolic dysfunction   . Aspiration precautions   . Hypernatremia 03/13/2014  . Bacteremia due to Gram-positive bacteria 03/12/2014  . Dysphagia 03/10/2014  . Acute on chronic diastolic heart failure (HCC) 03/10/2014  . Protein-calorie malnutrition, severe (HCC) 03/09/2014  . Chronic diastolic CHF (congestive heart failure) (HCC) 03/09/2014  . Inflammatory arthropathy 03/09/2014  . Macrocytic anemia 03/08/2014  . Urinary tract infection with pyuria 03/08/2014  . Hypokalemia 03/08/2014  . Sepsis (HCC) 03/08/2014  . Cellulitis of right hand 02/20/2014  . Chronic ulcer of toe of right foot with fat layer exposed (HCC) 02/20/2014  . Fracture of femoral neck, left (HCC) 02/17/2013  . BPH (benign prostatic hyperplasia)   . GERD (gastroesophageal reflux disease)   . Hyperlipidemia     Orientation RESPIRATION BLADDER Height & Weight     Self  Normal Incontinent, External catheter Weight: 113 lb 8.6 oz (51.5 kg) Height:  5\' 7"  (170.2 cm)  BEHAVIORAL SYMPTOMS/MOOD NEUROLOGICAL BOWEL NUTRITION STATUS   Continent Diet(dys 3)  AMBULATORY STATUS COMMUNICATION OF NEEDS Skin     Verbally                         Personal Care Assistance Level of Assistance              Functional Limitations Info  Sight, Hearing, Speech Sight Info: Adequate Hearing Info: Adequate Speech Info: Impaired(slurred)    SPECIAL CARE FACTORS FREQUENCY  PT (By licensed PT), OT (By licensed OT)     PT Frequency: 5x wk OT Frequency: 5x wk            Contractures Contractures Info: Not present    Additional Factors Info  Code Status, Allergies Code Status Info: DNR Allergies Info: NKA           Current Medications (08/18/2017):  This is the current hospital active medication list Current Facility-Administered Medications  Medication Dose Route Frequency Provider Last Rate Last Dose  . acetaminophen (TYLENOL) tablet 650 mg  650 mg Oral Q6H PRN Gwynn Burly, DO   650 mg at 08/17/17 1551   Or  . acetaminophen (TYLENOL) suppository 650 mg  650 mg Rectal Q6H PRN Gwynn Burly, DO      . apixaban Everlene Balls) tablet 2.5 mg  2.5 mg Oral BID Scherrie Gerlach, MD   2.5 mg at 08/18/17 1054  . [START ON 08/19/2017] cephALEXin (KEFLEX) capsule 500 mg  500 mg Oral Q12H Scherrie Gerlach, MD      . diltiazem (CARDIZEM CD) 24 hr capsule 180 mg  180 mg Oral Daily Reymundo Poll, MD  180 mg at 08/18/17 1053  . feeding supplement (BOOST / RESOURCE BREEZE) liquid 1 Container  1 Container Oral TID BM Levert FeinsteinGranfortuna, James M, MD   1 Container at 08/18/17 1337  . finasteride (PROSCAR) tablet 5 mg  5 mg Oral Daily Gwynn BurlyWallace, Andrew, DO   5 mg at 08/18/17 1054  . latanoprost (XALATAN) 0.005 % ophthalmic solution 1 drop  1 drop Both Eyes QHS Gwynn BurlyWallace, Andrew, DO   1 drop at 08/17/17 2141  . megestrol (MEGACE) 400 MG/10ML suspension 400 mg  400 mg Oral BID Scherrie GerlachHuang, Jennifer, MD   400 mg at 08/18/17 1054  . polyethylene glycol (MIRALAX / GLYCOLAX) packet 17 g  17 g Oral Daily PRN Gwynn BurlyWallace, Andrew, DO      . sodium chloride  flush (NS) 0.9 % injection 3 mL  3 mL Intravenous Q12H Gwynn BurlyWallace, Andrew, DO   3 mL at 08/18/17 1059  . tamsulosin (FLOMAX) capsule 0.4 mg  0.4 mg Oral QPC supper Gwynn BurlyWallace, Andrew, DO   0.4 mg at 08/17/17 1737     Discharge Medications: Please see discharge summary for a list of discharge medications.  Relevant Imaging Results:  Relevant Lab Results:   Additional Information SS# [id number]  Althea CharonAshley C Zhane Bluitt, LCSW

## 2017-08-19 ENCOUNTER — Encounter (HOSPITAL_COMMUNITY): Payer: Self-pay

## 2017-08-19 DIAGNOSIS — R339 Retention of urine, unspecified: Secondary | ICD-10-CM

## 2017-08-19 DIAGNOSIS — D6832 Hemorrhagic disorder due to extrinsic circulating anticoagulants: Secondary | ICD-10-CM

## 2017-08-19 DIAGNOSIS — Z681 Body mass index (BMI) 19 or less, adult: Secondary | ICD-10-CM

## 2017-08-19 DIAGNOSIS — T45515A Adverse effect of anticoagulants, initial encounter: Secondary | ICD-10-CM

## 2017-08-19 DIAGNOSIS — N401 Enlarged prostate with lower urinary tract symptoms: Secondary | ICD-10-CM

## 2017-08-19 DIAGNOSIS — R310 Gross hematuria: Secondary | ICD-10-CM

## 2017-08-19 LAB — IMMUNOFIXATION ELECTROPHORESIS
IGG (IMMUNOGLOBIN G), SERUM: 1313 mg/dL (ref 700–1600)
IgA: 735 mg/dL — ABNORMAL HIGH (ref 61–437)
IgM (Immunoglobulin M), Srm: 24 mg/dL (ref 15–143)
TOTAL PROTEIN ELP: 5.7 g/dL — AB (ref 6.0–8.5)

## 2017-08-19 MED ORDER — CEPHALEXIN 500 MG PO CAPS: 500.0000 mg | ORAL_CAPSULE | Freq: Two times a day (BID) | ORAL | 0 refills | Status: AC

## 2017-08-19 MED ORDER — BOOST BREEZE PO LIQD: 1.0000 | Freq: Three times a day (TID) | ORAL | 0 refills | Status: AC

## 2017-08-19 MED ORDER — MEGESTROL ACETATE 400 MG/10ML PO SUSP: 400.0000 mg | Freq: Two times a day (BID) | ORAL | 0 refills | Status: AC

## 2017-08-19 MED ORDER — DILTIAZEM HCL ER COATED BEADS 180 MG PO CP24: 180.0000 mg | ORAL_CAPSULE | Freq: Every day | ORAL | 0 refills | Status: AC

## 2017-08-19 NOTE — Progress Notes (Signed)
Physical Therapy Treatment Patient Details Name: [name] MRN: [id number] DOB: 1926-01-22 Today's Date: 08/19/2017    History of Present Illness 84 y.o m withchronic diastolic heart failure, BPH, hyperlipidemia who presents with malaise weakness for 1 week, work up underway    PT Comments    Patient received in bed asleep, family present; he is easily woken and willing to participate in PT this morning. He continues to require MaxAx2 for functional bed mobility, but was able to maintain midline position once upright with close S, quad strength noted to be 4+/5 with mild posterior lean during testing, severe bilateral ankle rigidity noted, likely impacting balance and gait. Performed sit to stand transfer three times with MaxAx2, patient initially fearful but with improved performance following encouragement and reassurance from therapists. He was left in bed with bed alarm activated, family present. He remains appropriate for SNF moving forward.     Follow Up Recommendations  SNF;Supervision/Assistance - 24 hour     Equipment Recommendations  Wheelchair (measurements PT)    Recommendations for Other Services       Precautions / Restrictions Precautions Precautions: Fall Restrictions Weight Bearing Restrictions: No    Mobility  Bed Mobility Overal bed mobility: Needs Assistance Bed Mobility: Supine to Sit;Sit to Supine     Supine to sit: Max assist;+2 for physical assistance Sit to supine: Max assist;+2 for physical assistance   General bed mobility comments: MaxA x2 for bd mobility today, cues for safety and sequencing   Transfers Overall transfer level: Needs assistance Equipment used: Rolling walker (2 wheeled) Transfers: Sit to/from Stand Sit to Stand: Max assist;+2 physical assistance         General transfer comment: performed functional sit to stand transfer x3 with MaxAx2; patient anxious regarding OOB mobility, performance improved with encouragement  and reassurance   Ambulation/Gait             General Gait Details: unable    Stairs            Wheelchair Mobility    Modified Rankin (Stroke Patients Only)       Balance Overall balance assessment: Needs assistance   Sitting balance-Leahy Scale: Fair Sitting balance - Comments: able to maintain midline sitting with close S     Standing balance-Leahy Scale: Poor Standing balance comment: reliant on UE support and external assistance                             Cognition Arousal/Alertness: Awake/alert Behavior During Therapy: Anxious Overall Cognitive Status: History of cognitive impairments - at baseline                                 General Comments: patient appears generally very fearful of OOB mobility       Exercises      General Comments General comments (skin integrity, edema, etc.): family present during session       Pertinent Vitals/Pain Pain Assessment: Faces Faces Pain Scale: Hurts a little bit Pain Location: eye  Pain Descriptors / Indicators: Sore Pain Intervention(s): Monitored during session;Limited activity within patient's tolerance    Home Living                      Prior Function            PT Goals (current goals can now be found in the  care plan section) Acute Rehab PT Goals Patient Stated Goal: to go to bathroom PT Goal Formulation: With patient/family Time For Goal Achievement: 08/30/17 Potential to Achieve Goals: Fair Progress towards PT goals: Progressing toward goals    Frequency    Min 2X/week      PT Plan Current plan remains appropriate    Co-evaluation PT/OT/SLP Co-Evaluation/Treatment: Yes Reason for Co-Treatment: For patient/therapist safety;To address functional/ADL transfers;Complexity of the patient's impairments (multi-system involvement) PT goals addressed during session: Mobility/safety with mobility;Balance;Proper use of DME        AM-PAC PT "6  Clicks" Daily Activity  Outcome Measure  Difficulty turning over in bed (including adjusting bedclothes, sheets and blankets)?: Unable Difficulty moving from lying on back to sitting on the side of the bed? : Unable Difficulty sitting down on and standing up from a chair with arms (e.g., wheelchair, bedside commode, etc,.)?: Unable Help needed moving to and from a bed to chair (including a wheelchair)?: Total Help needed walking in hospital room?: Total Help needed climbing 3-5 steps with a railing? : Total 6 Click Score: 6    End of Session Equipment Utilized During Treatment: Gait belt Activity Tolerance: Patient limited by fatigue Patient left: in bed;with bed alarm set;with family/visitor present   PT Visit Diagnosis: Adult, failure to thrive (R62.7);Muscle weakness (generalized) (M62.81)     Time: [id number] PT Time Calculation (min) (ACUTE ONLY): 24 min  Charges:  $Therapeutic Activity: 8-22 mins                    G Codes:       Nedra HaiKristen Unger PT, DPT, CBIS  Supplemental Physical Therapist [facility]   Pager [phone]

## 2017-08-19 NOTE — Care Management Important Message (Signed)
Important Message  Patient Details  Name: Arnette SchaumannGordon Q Hacker MRN: [id number] Date of Birth: [birth year]   Medicare Important Message Given:  Yes    Lawerance Sabalebbie Blade Scheff, RN 08/19/2017, 1:41 PM

## 2017-08-19 NOTE — Clinical Social Work Placement (Signed)
   CLINICAL SOCIAL WORK PLACEMENT  NOTE  Date:  08/19/2017  Patient Details  Name: [name] MRN: [id number] Date of Birth: [birth year]  Clinical Social Work is seeking post-discharge placement for this patient at the Skilled  Nursing Facility level of care (*CSW will initial, date and re-position this form in  chart as items are completed):  Yes   Patient/family provided with [facility] Clinical Social Work Department's list of facilities offering this level of care within the geographic area requested by the patient (or if unable, by the patient's family).  Yes   Patient/family informed of their freedom to choose among providers that offer the needed level of care, that participate in Medicare, Medicaid or managed care program needed by the patient, have an available bed and are willing to accept the patient.  Yes   Patient/family informed of [facility]'s ownership interest in [facility] and [facility], as well as of the fact that they are under no obligation to receive care at these facilities.  PASRR submitted to EDS on 08/18/17     PASRR number received on 08/18/17     Existing PASRR number confirmed on       FL2 transmitted to all facilities in geographic area requested by pt/family on 08/18/17     FL2 transmitted to all facilities within larger geographic area on       Patient informed that his/her managed care company has contracts with or will negotiate with certain facilities, including the following:        Yes   Patient/family informed of bed offers received.  [facility] chooses bed at [facility]     Physician recommends and patient chooses bed at      Patient to be transferred to [facility] on 08/19/17.  Patient to be transferred to facility by ptar     Patient family notified on 08/19/17 of transfer.  Name of family member notified:  [name]     PHYSICIAN Please sign DNR     Additional Comment:     _______________________________________________ George SisUris, Kimmora Risenhoover H, LCSW 08/19/2017, 1:15 PM

## 2017-08-19 NOTE — Progress Notes (Signed)
Medicine attending: I examined this patient today together with resident physician Dr. Scherrie GerlachJennifer Huang and I concur with her evaluation and management plan which we discussed together.  A bed offer has been made at [facility].  Anticipate discharge today. 1 of his daughters is present.  Detailed discussion of hospital course and outstanding issues. He has developed gross hematuria on low-dose anticoagulation which we will now have to discontinue. He will need a cystoscopy.  He is already established [place]rology in [place].  We encouraged the daughter to call to expedite a short interval follow-up visit.

## 2017-08-19 NOTE — Progress Notes (Signed)
Report attempted to [facility] with no response. Contact info given to [facility] to give to receiving nurse, if needed. IV and telemetry box removed. Pt's daughter left with pt's belongings.    Berdine DanceLauren Moffitt BSN, RN

## 2017-08-19 NOTE — Care Management Note (Signed)
Case Management Note Donn PieriniKristi Meili Kleckley RN, BSN Unit 4E-Case Manager [phone]  Patient Details  Name: [name] MRN: [id number] Date of Birth: Jan 04, 1926  Subjective/Objective:    Pt admitted with Afib               Action/Plan: PTA Pt lived at home is an active [facility] patient- plan is to transition [facility] - CSW following for SNF placement needs.   Expected Discharge Date:  08/19/17               Expected Discharge Plan:  Skilled Nursing Facility  In-House Referral:  Clinical Social Work  Discharge planning Services  CM Consult  Post Acute Care Choice:  NA Choice offered to:  NA  DME Arranged:    DME Agency:     HH Arranged:    HH Agency:     Status of Service:  Completed, signed off  If discussed at [employer] Length of Stay Meetings, dates discussed:    Discharge Disposition: skilled facility   Additional Comments:  Darrold SpanWebster, Bernis Stecher Hall, RN 08/19/2017, 11:35 AM

## 2017-08-19 NOTE — Progress Notes (Signed)
Subjective:  Doing well this morning. Denies chest pain, palpitations, or shortness of breath. Likes the [employer]ost Breeze, patient was asking for another one. Able to eat grits and some eggs for breakfast this morning. Spoke to patient's daughter at bedside and answered her questions. She is amenable to patient discharging to SNF today with plan to follow up with [facility] and outpatient urology.  Objective:  Vital signs in last 24 hours: Vitals:   08/18/17 1700 08/18/17 1800 08/19/17 0000 08/19/17 0400  BP: (!) 109/59 96/61 101/60 114/66  Pulse:      Resp: (!) 23 (!) 22 19 16   Temp:   98.5 F (36.9 C) 98.2 F (36.8 C)  TempSrc:   Oral Oral  SpO2: 100% 100% 99% 100%  Weight:    129 lb 3 oz (58.6 kg)  Height:       Physical Exam Constitutional: NAD, appears comfortable. Severely cachetic with diffuse muscle wasting HEENT: Atraumatic, normocephalic. PERRL, anicteric sclera. Adentulous Neck: Supple, trachea midline. Cardiovascular: Irregularly irregular, no m/r/g Pulmonary/Chest: Clear to ascultation anteriorly, no wheezes, rales, or rhonchi. No increased work of breathing. Abdominal: Soft, non tender, non distended. +BS. Well healed midline scar.  Extremities: Warm and well perfused. Distal pulses intact. No edema.  Neurological: A&Ox3, CN II - XII grossly intact. Bilateral strength and sensation intact. Reflexes intact.  Skin: No rashes or erythema  Psychiatric: Normal mood and affect  Assessment/Plan:  Mr. Lorenda HatchetSlade is a 84yo M with PMH significant for HFpEF, BPH, colon cancer s/p sigmoidectomy in 2001 presenting with failure to thrive, generalized weakness, and malaise x 1 week. Found to have UTI and to be in new onset afib with RVR without clear etiology on labs or imaging. TTE with LVEF 55%, grade 1 DD. Now on oral diltiazem with good rate control. Had increased hematuria with initiation of anticoagulation, so will discontinue. Patient stable for discharge today.  Atrial fibrillation,  rate controlled on diltiazem CHADS2-VASc score of at least 3, with 3.2% stroke risk per year. Started low dose apixaban yesterday for long-term anticoagulation, however patient had increased hematuria, so will discontinue this. Risks of long-term anticoagulation outweigh benefits. - Telemetry - Discontinue Apixaban 2.5mg  BID - PO diltiazem 180mg  daily - [facility]  HFpEF (TTE in 2015 with LVEF 50-55% and grade 1 DD) Repeat TTE with LVEF 55%, grade 1 DD, mild focal basal septal hypertrophy. Patient had stopped all heart failure medications, previously on coreg 3.125 mg BID and ramipril 1.25 mg daily. Soft BP - 114/66 this AM, limiting ability to restart these medications. Euvolemic on exam. - Continue to monitor  UTI No symptoms, however given his age and presentation with generalized weakness and malaise, will treat with abx. UCx growing Proteus, sensitive to cephalosporins. IV ceftriaxone started 2/10, now transitioned to PO Keflex for 5 total days of treatment. - Continue PO Keflex 500mg  BID x5d total (2/10 -> 2/14)  Failure to Thrive, Hx of Esophogeal Dysmotility Reports poor appetite and no desire to eat. Likely normal aging process in 84 yo M with history of heart failure and colon cancer. SLP eval recommend soft diet with thin liquids. Nutrition recommending Boost Breeze, as patient does not like Ensure. Eating a little more with Boost Breeze and Megace trial. - Boost Breeze TID - PT/OT -> SNF - Social work consulted - D/c to SNF today - Continue trial of megace 400mg  twice daily for appetite stimulation  Hematuria [facility]. Some concern for bladder malignancy on CT  abdomen and pelvis from 2014, unfortunately patient never received cystoscopy. Again, unlikely to be a surgical candidate. Worsened hematuria with initiation of apixaban, will discontinue anticoagulation. - Follow up outpatient with urology  BPH - Continue finasteride 5 mg qd -  Tamsuloxin 0.4 mg qd  Anemia of chronic disease Vitamin B-12 and folate normal. Iron studies suggest anemia of chronic disease. Kappa/lambda light chain ratio very slightly low - not significant and will follow up on serum IFE. - F/u serum IFE   Dispo: Anticipated discharge today  Scherrie Gerlach, MD 08/19/2017, 9:48 AM Pager: [phone]

## 2017-08-19 NOTE — Progress Notes (Signed)
Occupational Therapy Treatment Patient Details Name: [name] MRN: [id number] DOB: Dec 25, 1925 Today's Date: 08/19/2017    History of present illness 84 y.o m withchronic diastolic heart failure, BPH, hyperlipidemia who presents with malaise weakness for 1 week, work up underway   OT comments  Pt progressing towards OT goals this session, able to participate in seated grooming task EOB with decreased assist from previous session. Pt fearful of OOB activity but able to perform sit <> stand x3 with max A +2 in progression towards functional transfers essential for ADL. SNF remains appropriate. OT will continue to follow acutely to address established goals.   Follow Up Recommendations  SNF;Supervision/Assistance - 24 hour    Equipment Recommendations  Other (comment)(defer to next venue)    Recommendations for Other Services      Precautions / Restrictions Precautions Precautions: Fall Restrictions Weight Bearing Restrictions: No       Mobility Bed Mobility Overal bed mobility: Needs Assistance Bed Mobility: Supine to Sit;Sit to Supine     Supine to sit: Max assist;+2 for physical assistance Sit to supine: Max assist;+2 for physical assistance   General bed mobility comments: MaxA x2 for bd mobility today, cues for safety and sequencing   Transfers Overall transfer level: Needs assistance Equipment used: Rolling walker (2 wheeled) Transfers: Sit to/from Stand Sit to Stand: Max assist;+2 physical assistance         General transfer comment: performed functional sit to stand transfer x3 with MaxAx2; patient anxious regarding OOB mobility, performance improved with encouragement and reassurance     Balance Overall balance assessment: Needs assistance   Sitting balance-Leahy Scale: Fair Sitting balance - Comments: able to maintain midline sitting with close S     Standing balance-Leahy Scale: Poor Standing balance comment: reliant on UE support and external  assistance                            ADL either performed or assessed with clinical judgement   ADL Overall ADL's : Needs assistance/impaired     Grooming: Wash/dry face;Minimal assistance;Sitting   Upper Body Bathing: Moderate assistance;Sitting Upper Body Bathing Details (indicate cue type and reason): mod A to wash back and apply lotion             Toilet Transfer: Maximal assistance;+2 for safety/equipment;+2 for physical assistance Toilet Transfer Details (indicate cue type and reason): performed sit <> stand with side steps for positioning            General ADL Comments: Pt min A for grooming tasks, Pt fearful of mobility and required verbal cues for sequencing and safety in addition to physical assist     Vision       Perception     Praxis      Cognition Arousal/Alertness: Awake/alert Behavior During Therapy: Anxious Overall Cognitive Status: History of cognitive impairments - at baseline                                 General Comments: patient appears generally very fearful of OOB mobility         Exercises     Shoulder Instructions       General Comments daughter and son present during session    Pertinent Vitals/ Pain       Pain Assessment: Faces Faces Pain Scale: Hurts a little bit Pain Location: right eye  Pain  Descriptors / Indicators: Sore Pain Intervention(s): Monitored during session  Home Living                                          Prior Functioning/Environment              Frequency  Min 2X/week        Progress Toward Goals  OT Goals(current goals can now be found in the care plan section)  Progress towards OT goals: Progressing toward goals  Acute Rehab OT Goals Patient Stated Goal: none stated this session  Plan Discharge plan remains appropriate;Frequency remains appropriate    Co-evaluation    PT/OT/SLP Co-Evaluation/Treatment: Yes Reason for Co-Treatment:  For patient/therapist safety;To address functional/ADL transfers PT goals addressed during session: Mobility/safety with mobility OT goals addressed during session: ADL's and self-care      AM-PAC PT "6 Clicks" Daily Activity     Outcome Measure   Help from another person eating meals?: A Lot Help from another person taking care of personal grooming?: A Lot Help from another person toileting, which includes using toliet, bedpan, or urinal?: Total Help from another person bathing (including washing, rinsing, drying)?: Total Help from another person to put on and taking off regular upper body clothing?: A Lot Help from another person to put on and taking off regular lower body clothing?: Total 6 Click Score: 9    End of Session Equipment Utilized During Treatment: Gait belt  OT Visit Diagnosis: Unsteadiness on feet (R26.81);Other abnormalities of gait and mobility (R26.89);Muscle weakness (generalized) (M62.81);Other symptoms and signs involving cognitive function;Pain   Activity Tolerance Patient tolerated treatment well   Patient Left in bed;with call bell/phone within reach;with family/visitor present;with bed alarm set   Nurse Communication Mobility status        Time: [id number] OT Time Calculation (min): 26 min  Charges: OT General Charges $OT Visit: 1 Visit OT Treatments $Self Care/Home Management : 8-22 mins  Sherryl MangesLaura Vanity Larsson OTR/L [phone]  Evern BioLaura J Anyelina Claycomb 08/19/2017, 2:55 PM

## 2017-08-21 ENCOUNTER — Inpatient Hospital Stay: Admission: RE | Admit: 2017-08-21 | Payer: Medicare Other | Source: Ambulatory Visit

## 2017-08-25 ENCOUNTER — Ambulatory Visit (HOSPITAL_COMMUNITY): Payer: Medicare (Managed Care) | Admitting: Nurse Practitioner

## 2017-08-27 ENCOUNTER — Ambulatory Visit
Admission: RE | Admit: 2017-08-27 | Discharge: 2017-08-27 | Disposition: A | Discharge status: Home / Self Care | Payer: Medicare (Managed Care) | Source: Ambulatory Visit | Attending: Family Medicine | Admitting: Family Medicine

## 2017-08-27 DIAGNOSIS — R310 Gross hematuria: Secondary | ICD-10-CM

## 2017-10-05 DEATH — deceased
